# Patient Record
Sex: Female | Born: 1945 | Race: White | Hispanic: No | Smoking: Never smoker
Health system: Southern US, Community
[De-identification: ages and names within clinical notes are randomized; demographics above are authoritative.]

## PROBLEM LIST (undated history)

## (undated) DIAGNOSIS — G709 Myoneural disorder, unspecified: Secondary | ICD-10-CM

## (undated) DIAGNOSIS — Z860100 Personal history of colon polyps, unspecified: Secondary | ICD-10-CM

## (undated) DIAGNOSIS — E785 Hyperlipidemia, unspecified: Secondary | ICD-10-CM

## (undated) DIAGNOSIS — R010 Benign and innocent cardiac murmurs: Secondary | ICD-10-CM

## (undated) DIAGNOSIS — I10 Essential (primary) hypertension: Secondary | ICD-10-CM

## (undated) DIAGNOSIS — F32A Depression, unspecified: Secondary | ICD-10-CM

## (undated) DIAGNOSIS — K219 Gastro-esophageal reflux disease without esophagitis: Secondary | ICD-10-CM

## (undated) DIAGNOSIS — C4491 Basal cell carcinoma of skin, unspecified: Secondary | ICD-10-CM

## (undated) DIAGNOSIS — H269 Unspecified cataract: Secondary | ICD-10-CM

## (undated) DIAGNOSIS — T7840XA Allergy, unspecified, initial encounter: Secondary | ICD-10-CM

## (undated) DIAGNOSIS — N189 Chronic kidney disease, unspecified: Secondary | ICD-10-CM

## (undated) DIAGNOSIS — L570 Actinic keratosis: Secondary | ICD-10-CM

## (undated) DIAGNOSIS — F419 Anxiety disorder, unspecified: Secondary | ICD-10-CM

## (undated) DIAGNOSIS — F329 Major depressive disorder, single episode, unspecified: Secondary | ICD-10-CM

## (undated) HISTORY — DX: Anxiety disorder, unspecified: F41.9

## (undated) HISTORY — DX: Benign and innocent cardiac murmurs: R01.0

## (undated) HISTORY — DX: Basal cell carcinoma of skin, unspecified: C44.91

## (undated) HISTORY — DX: Personal history of colon polyps, unspecified: Z86.0100

## (undated) HISTORY — DX: Allergy, unspecified, initial encounter: T78.40XA

## (undated) HISTORY — DX: Actinic keratosis: L57.0

## (undated) HISTORY — DX: Essential (primary) hypertension: I10

## (undated) HISTORY — DX: Chronic kidney disease, unspecified: N18.9

## (undated) HISTORY — DX: Hyperlipidemia, unspecified: E78.5

## (undated) HISTORY — DX: Depression, unspecified: F32.A

## (undated) HISTORY — PX: ABDOMINAL HYSTERECTOMY: SHX81

## (undated) HISTORY — DX: Gastro-esophageal reflux disease without esophagitis: K21.9

## (undated) HISTORY — DX: Unspecified cataract: H26.9

## (undated) HISTORY — DX: Personal history of colon polyps: Z86.010

## (undated) HISTORY — DX: Myoneural disorder, unspecified: G70.9

## (undated) HISTORY — DX: Major depressive disorder, single episode, unspecified: F32.9

---

## 1989-03-13 HISTORY — PX: TOTAL ABDOMINAL HYSTERECTOMY W/ BILATERAL SALPINGOOPHORECTOMY: SHX83

## 2004-04-14 ENCOUNTER — Ambulatory Visit: Payer: Self-pay | Admitting: Family Medicine

## 2004-05-30 ENCOUNTER — Ambulatory Visit: Payer: Self-pay | Admitting: General Surgery

## 2004-12-06 ENCOUNTER — Ambulatory Visit: Payer: Self-pay | Admitting: Family Medicine

## 2005-06-29 ENCOUNTER — Ambulatory Visit: Payer: Self-pay | Admitting: Internal Medicine

## 2006-09-12 ENCOUNTER — Ambulatory Visit: Payer: Self-pay | Admitting: Specialist

## 2007-09-17 ENCOUNTER — Ambulatory Visit: Payer: Self-pay | Admitting: Internal Medicine

## 2007-09-26 ENCOUNTER — Ambulatory Visit: Payer: Self-pay | Admitting: Internal Medicine

## 2007-12-17 ENCOUNTER — Ambulatory Visit: Payer: Self-pay | Admitting: Internal Medicine

## 2008-06-01 ENCOUNTER — Ambulatory Visit: Payer: Self-pay | Admitting: Internal Medicine

## 2008-10-20 ENCOUNTER — Ambulatory Visit: Payer: Self-pay | Admitting: Internal Medicine

## 2008-11-03 ENCOUNTER — Ambulatory Visit: Payer: Self-pay | Admitting: Internal Medicine

## 2009-08-08 LAB — HM PAP SMEAR

## 2010-10-09 LAB — HM MAMMOGRAPHY: HM Mammogram: NORMAL

## 2010-12-23 ENCOUNTER — Telehealth: Payer: Self-pay | Admitting: Internal Medicine

## 2010-12-23 DIAGNOSIS — F32A Depression, unspecified: Secondary | ICD-10-CM

## 2010-12-23 DIAGNOSIS — F329 Major depressive disorder, single episode, unspecified: Secondary | ICD-10-CM

## 2010-12-23 MED ORDER — FLUOXETINE HCL 40 MG PO CAPS: 40.0000 mg | ORAL_CAPSULE | Freq: Every day | ORAL | Status: DC

## 2010-12-23 NOTE — Telephone Encounter (Signed)
Patient is currently on fluoxetine 20 mg capsules she feels like she needs something stronger and is asking if could increase it to 40 mg.

## 2010-12-23 NOTE — Telephone Encounter (Signed)
Pt husband walked in wanted to see if you can refill her rx for fluoxetine 20mg  capsules .  Pt wanted to know if you could up 20mg  to 40mg  Pharmacy [facility]

## 2010-12-23 NOTE — Telephone Encounter (Signed)
Yes she can increase her dose of fluoxetine to 40 mg daiy.  i will send a new rx to her pharmacy

## 2010-12-23 NOTE — Telephone Encounter (Signed)
Patient notified. Rx sent to pharmacy,.

## 2011-03-27 ENCOUNTER — Telehealth: Payer: Self-pay | Admitting: Internal Medicine

## 2011-03-27 DIAGNOSIS — F329 Major depressive disorder, single episode, unspecified: Secondary | ICD-10-CM

## 2011-03-27 DIAGNOSIS — F32A Depression, unspecified: Secondary | ICD-10-CM

## 2011-03-27 MED ORDER — VALSARTAN-HYDROCHLOROTHIAZIDE 80-12.5 MG PO TABS: 1.0000 | ORAL_TABLET | Freq: Every day | ORAL | Status: DC

## 2011-03-27 MED ORDER — FLUOXETINE HCL 20 MG PO TABS: 20.0000 mg | ORAL_TABLET | Freq: Every day | ORAL | Status: DC

## 2011-03-27 NOTE — Telephone Encounter (Signed)
I cannot fill the second med with out a dose.  Her chart has not been abstracted.  Can you find out from patient  thanks

## 2011-03-27 NOTE — Telephone Encounter (Signed)
Valsartan hctz 160mg /25mg   1/2 tablet daily  Pt cut in half.  If generic pt stated she would be ok with that

## 2011-03-27 NOTE — Telephone Encounter (Signed)
Both meds are already generic.  Doses were adjusted to whole tablets. Can pick up in am

## 2011-03-27 NOTE — Telephone Encounter (Signed)
[phone] Pt called insurance changed and she needs to get 90 supply of meds plus 3 refills flueoxtine 40mg  1 daily     Pt was taking 20mg  2x daily  She would like to switch back to the 20mg  she stated she didn't fill like she needed the 40 every day  Valsartan hctz 1/2 tablet daily  Pt would like to pick up rx

## 2011-03-28 NOTE — Telephone Encounter (Signed)
Called pt to let her know rx is ready to be picked up

## 2011-03-29 ENCOUNTER — Other Ambulatory Visit: Payer: Self-pay | Admitting: Internal Medicine

## 2011-03-29 MED ORDER — FLUOXETINE HCL 20 MG PO TABS: 20.0000 mg | ORAL_TABLET | Freq: Two times a day (BID) | ORAL | Status: DC

## 2011-07-13 ENCOUNTER — Telehealth: Payer: Self-pay | Admitting: Internal Medicine

## 2011-07-13 DIAGNOSIS — Z1239 Encounter for other screening for malignant neoplasm of breast: Secondary | ICD-10-CM

## 2011-07-13 NOTE — Telephone Encounter (Signed)
Patient needing a follow up mammogram order and labs .

## 2011-07-17 NOTE — Telephone Encounter (Signed)
On printer

## 2011-07-17 NOTE — Telephone Encounter (Signed)
Patient notified

## 2011-08-09 ENCOUNTER — Ambulatory Visit (INDEPENDENT_AMBULATORY_CARE_PROVIDER_SITE_OTHER): Payer: Managed Care, Other (non HMO) | Admitting: Internal Medicine

## 2011-08-09 ENCOUNTER — Encounter: Payer: Self-pay | Admitting: Internal Medicine

## 2011-08-09 VITALS — BP 130/76 | HR 75 | Temp 98.1°F | Resp 16 | Wt 165.0 lb

## 2011-08-09 DIAGNOSIS — J301 Allergic rhinitis due to pollen: Secondary | ICD-10-CM

## 2011-08-09 DIAGNOSIS — F329 Major depressive disorder, single episode, unspecified: Secondary | ICD-10-CM

## 2011-08-09 DIAGNOSIS — Z86010 Personal history of colonic polyps: Secondary | ICD-10-CM

## 2011-08-09 DIAGNOSIS — K219 Gastro-esophageal reflux disease without esophagitis: Secondary | ICD-10-CM

## 2011-08-09 DIAGNOSIS — R5383 Other fatigue: Secondary | ICD-10-CM

## 2011-08-09 DIAGNOSIS — I10 Essential (primary) hypertension: Secondary | ICD-10-CM

## 2011-08-09 DIAGNOSIS — F32A Depression, unspecified: Secondary | ICD-10-CM

## 2011-08-09 DIAGNOSIS — E785 Hyperlipidemia, unspecified: Secondary | ICD-10-CM

## 2011-08-09 DIAGNOSIS — R5381 Other malaise: Secondary | ICD-10-CM

## 2011-08-09 NOTE — Patient Instructions (Addendum)
You can try 180 mg of allegra daily (fexofenadine)    Return for fasting labs  Consider using simply saline twice da day to lavage your sinuses

## 2011-08-09 NOTE — Progress Notes (Signed)
Patient ID: Grace [name], female   DOB: 1945-04-05, 66 y.o.   MRN: [id number] Patient Active Problem List  Diagnoses  . Depression  . GERD (gastroesophageal reflux disease)  . Hypertension  . Benign heart murmur  . Hx of colonic polyps  . Allergic rhinitis due to pollen  . Other and unspecified hyperlipidemia    Subjective:  CC:   Chief Complaint  Patient presents with  . Follow-up    HPI:   Grace [name] a 66 y.o. female who presents  Past Medical History  Diagnosis Date  . Hyperlipidemia   . Depression     resolved  . allergic rhinitis   . GERD (gastroesophageal reflux disease)   . Hypertension   . Benign heart murmur   . Hx of colonic polyps     History reviewed. No pertinent past surgical history.       The following portions of the patient's history were reviewed and updated as appropriate: Allergies, current medications, and problem list.    Review of Systems:   12 Pt  review of systems was negative except those addressed in the HPI,     History   Social History  . Marital Status: Married    Spouse Name: N/A    Number of Children: N/A  . Years of Education: N/A   Occupational History  . Not on file.   Social History Main Topics  . Smoking status: Never Smoker   . Smokeless tobacco: Never Used  . Alcohol Use: No  . Drug Use: No  . Sexually Active: Not on file   Other Topics Concern  . Not on file   Social History Narrative  . No narrative on file    Objective:  BP 130/76  Pulse 75  Temp(Src) 98.1 F (36.7 C) (Oral)  Resp 16  Wt 165 lb (74.844 kg)  SpO2 97%  General appearance: alert, cooperative and appears stated age Ears: normal TM's and external ear canals both ears Throat: lips, mucosa, and tongue normal; teeth and gums normal Neck: no adenopathy, no carotid bruit, supple, symmetrical, trachea midline and thyroid not enlarged, symmetric, no tenderness/mass/nodules Back: symmetric, no curvature. ROM normal. No CVA  tenderness. Lungs: clear to auscultation bilaterally Heart: regular rate and rhythm, S1, S2 normal, no murmur, click, rub or gallop Abdomen: soft, non-tender; bowel sounds normal; no masses,  no organomegaly Pulses: 2+ and symmetric Skin: Skin color, texture, turgor normal. No rashes or lesions Lymph nodes: Cervical, supraclavicular, and axillary nodes normal.  Assessment and Plan:  GERD (gastroesophageal reflux disease) Managed with daily omeprazole. No sympomts of dysphagia or breakthrough symptoms   Depression Currently resolved.   Hx of colonic polyps Found n last colonoscopy,  5 yr follow up planned. Dr. Charna Elizabeth.   Hypertension Well controlled on current medications.  No changes today.  Allergic rhinitis due to pollen Not controlled with childrens strength zyrtec.  Alternatives including 10 ng dose vs allegra, plus saline lavage  Recommended.   Other and unspecified hyperlipidemia Untreated LDL is 150,  HDL is 60 , normal triglycerides,  Will recommended repeat in 6 mnonths after diet and exercise for gaol LDL of 100,  Statin therapy trial will be discussed pending repeat evaluation .     Updated Medication List Outpatient Encounter Prescriptions as of 08/09/2011  Medication Sig Dispense Refill  . cetirizine (ZYRTEC) 5 MG tablet Take 5 mg by mouth daily.      Marland Kitchen esomeprazole (NEXIUM) 40 MG capsule Take 40 mg by mouth  daily before breakfast.      . Krill Oil 300 MG CAPS Take by mouth.      . Multiple Vitamin (MULTIVITAMIN) tablet Take 1 tablet by mouth daily.      Marland Kitchen omeprazole (PRILOSEC OTC) 20 MG tablet Take 20 mg by mouth daily.      . Probiotic Product (PROBIOTIC FORMULA PO) Take by mouth.      . QUERCETIN PO Take by mouth.      . valsartan-hydrochlorothiazide (DIOVAN HCT) 80-12.5 MG per tablet Take 1 tablet by mouth daily.  90 tablet  3  . DISCONTD: FLUoxetine (PROZAC) 20 MG tablet Take 1 tablet (20 mg total) by mouth 2 (two) times daily.  180 tablet  3      Orders Placed This Encounter  Procedures  . HM MAMMOGRAPHY  . HM PAP SMEAR  . TSH  . Lipid panel  . COMPLETE METABOLIC PANEL WITH GFR  . HM COLONOSCOPY    No Follow-up on file.

## 2011-08-11 ENCOUNTER — Other Ambulatory Visit (INDEPENDENT_AMBULATORY_CARE_PROVIDER_SITE_OTHER): Payer: Managed Care, Other (non HMO) | Admitting: *Deleted

## 2011-08-11 DIAGNOSIS — E785 Hyperlipidemia, unspecified: Secondary | ICD-10-CM

## 2011-08-11 DIAGNOSIS — R5383 Other fatigue: Secondary | ICD-10-CM

## 2011-08-11 DIAGNOSIS — R5381 Other malaise: Secondary | ICD-10-CM

## 2011-08-11 LAB — LIPID PANEL
Cholesterol: 229 mg/dL — ABNORMAL HIGH (ref 0–200)
Total CHOL/HDL Ratio: 4

## 2011-08-12 LAB — COMPLETE METABOLIC PANEL WITH GFR
ALT: 19 U/L (ref 0–35)
AST: 21 U/L (ref 0–37)
Calcium: 9 mg/dL (ref 8.4–10.5)
Chloride: 105 mEq/L (ref 96–112)
Creat: 0.97 mg/dL (ref 0.50–1.10)
Sodium: 142 mEq/L (ref 135–145)
Total Bilirubin: 0.4 mg/dL (ref 0.3–1.2)
Total Protein: 6.1 g/dL (ref 6.0–8.3)

## 2011-08-13 ENCOUNTER — Encounter: Payer: Self-pay | Admitting: Internal Medicine

## 2011-08-13 DIAGNOSIS — F339 Major depressive disorder, recurrent, unspecified: Secondary | ICD-10-CM | POA: Insufficient documentation

## 2011-08-13 DIAGNOSIS — Z860100 Personal history of colon polyps, unspecified: Secondary | ICD-10-CM | POA: Insufficient documentation

## 2011-08-13 DIAGNOSIS — K219 Gastro-esophageal reflux disease without esophagitis: Secondary | ICD-10-CM | POA: Insufficient documentation

## 2011-08-13 DIAGNOSIS — J301 Allergic rhinitis due to pollen: Secondary | ICD-10-CM | POA: Insufficient documentation

## 2011-08-13 DIAGNOSIS — E785 Hyperlipidemia, unspecified: Secondary | ICD-10-CM | POA: Insufficient documentation

## 2011-08-13 DIAGNOSIS — I10 Essential (primary) hypertension: Secondary | ICD-10-CM | POA: Insufficient documentation

## 2011-08-13 DIAGNOSIS — R010 Benign and innocent cardiac murmurs: Secondary | ICD-10-CM | POA: Insufficient documentation

## 2011-08-13 NOTE — Assessment & Plan Note (Signed)
Found n last colonoscopy,  5 yr follow up planned. Dr. Charna Elizabeth.

## 2011-08-13 NOTE — Assessment & Plan Note (Signed)
Not controlled with childrens strength zyrtec.  Alternatives including 10 ng dose vs allegra, plus saline lavage  Recommended.

## 2011-08-13 NOTE — Assessment & Plan Note (Signed)
Currently resolved. °

## 2011-08-13 NOTE — Assessment & Plan Note (Signed)
Managed with daily omeprazole. No sympomts of dysphagia or breakthrough symptoms

## 2011-08-13 NOTE — Assessment & Plan Note (Signed)
Untreated LDL is 150,  HDL is 60 , normal triglycerides,  Will recommended repeat in 6 mnonths after diet and exercise for gaol LDL of 100,  Statin therapy trial will be discussed pending repeat evaluation .

## 2011-08-13 NOTE — Assessment & Plan Note (Signed)
Well controlled on current medications.  No changes today. 

## 2011-09-14 LAB — HM MAMMOGRAPHY: HM Mammogram: NORMAL

## 2011-10-17 ENCOUNTER — Encounter: Payer: Self-pay | Admitting: Internal Medicine

## 2011-10-30 ENCOUNTER — Encounter: Payer: Self-pay | Admitting: Internal Medicine

## 2011-12-19 ENCOUNTER — Encounter: Payer: Self-pay | Admitting: Internal Medicine

## 2012-02-14 ENCOUNTER — Ambulatory Visit (INDEPENDENT_AMBULATORY_CARE_PROVIDER_SITE_OTHER): Payer: Managed Care, Other (non HMO) | Admitting: Internal Medicine

## 2012-02-14 ENCOUNTER — Encounter: Payer: Self-pay | Admitting: Internal Medicine

## 2012-02-14 ENCOUNTER — Telehealth: Payer: Self-pay | Admitting: Internal Medicine

## 2012-02-14 VITALS — BP 132/74 | HR 81 | Temp 98.2°F | Resp 12 | Ht 66.0 in | Wt 167.8 lb

## 2012-02-14 DIAGNOSIS — I10 Essential (primary) hypertension: Secondary | ICD-10-CM

## 2012-02-14 DIAGNOSIS — E785 Hyperlipidemia, unspecified: Secondary | ICD-10-CM

## 2012-02-14 DIAGNOSIS — M2010 Hallux valgus (acquired), unspecified foot: Secondary | ICD-10-CM

## 2012-02-14 DIAGNOSIS — Z23 Encounter for immunization: Secondary | ICD-10-CM

## 2012-02-14 DIAGNOSIS — T819XXA Unspecified complication of procedure, initial encounter: Secondary | ICD-10-CM

## 2012-02-14 DIAGNOSIS — M21619 Bunion of unspecified foot: Secondary | ICD-10-CM

## 2012-02-14 DIAGNOSIS — K219 Gastro-esophageal reflux disease without esophagitis: Secondary | ICD-10-CM

## 2012-02-14 DIAGNOSIS — M21611 Bunion of right foot: Secondary | ICD-10-CM

## 2012-02-14 NOTE — Telephone Encounter (Signed)
Pt came by and said she found Dr. Virl Son at [facility] in [place] there ph number is [phone]. She also say if you know of someone in [place] that is better that she would love to go to someone that you recommend as well.

## 2012-02-14 NOTE — Progress Notes (Addendum)
Patient ID: Grace [name], female   DOB: 1945-06-26, 66 y.o.   MRN: [id number]    Patient Active Problem List  Diagnosis  . Depression  . GERD (gastroesophageal reflux disease)  . Hypertension  . Benign heart murmur  . Hx of colonic polyps  . Allergic rhinitis due to pollen  . Other and unspecified hyperlipidemia  . Hallux valgus with bunions    Subjective:  CC:   Chief Complaint  Patient presents with  . Follow-up    HPI:   Grace [name] a 66 y.o. female who presents 6 month follow up on hypertension and hyperlipidemia.  She is tolerating her medications without sided effects.   She checks her blood pressure randomly at home and her readings have been < 140/80 consistently.  2) She hasa new complaint of worsening persistent right foot pain and is unable to wear any shoes other than tennis shoes due to a great toe bunion that failed to resolve with prior bunionectomy.  Her second and third toes have deviated medially and cross her great toe  She has considered repeat surgery but is requesting my opinion.      Past Medical History  Diagnosis Date  . Hyperlipidemia   . Depression     resolved  . allergic rhinitis   . GERD (gastroesophageal reflux disease)   . Hypertension   . Benign heart murmur   . Hx of colonic polyps     History reviewed. No pertinent past surgical history.       The following portions of the patient's history were reviewed and updated as appropriate: Allergies, current medications, and problem list.    Review of Systems:   12 Pt  review of systems was negative except those addressed in the HPI,     History   Social History  . Marital Status: Married    Spouse Name: N/A    Number of Children: N/A  . Years of Education: N/A   Occupational History  . Not on file.   Social History Main Topics  . Smoking status: Never Smoker   . Smokeless tobacco: Never Used  . Alcohol Use: No  . Drug Use: No  . Sexually Active: Not on file    Other Topics Concern  . Not on file   Social History Narrative  . No narrative on file    Objective:  BP 132/74  Pulse 81  Temp 98.2 F (36.8 C) (Oral)  Resp 12  Ht 5\' 6"  (1.676 m)  Wt 167 lb 12 oz (76.091 kg)  BMI 27.08 kg/m2  SpO2 96%  General appearance: alert, cooperative and appears stated age Ears: normal TM's and external ear canals both ears Throat: lips, mucosa, and tongue normal; teeth and gums normal Neck: no adenopathy, no carotid bruit, supple, symmetrical, trachea midline and thyroid not enlarged, symmetric, no tenderness/mass/nodules Back: symmetric, no curvature. ROM normal. No CVA tenderness. Lungs: clear to auscultation bilaterally Heart: regular rate and rhythm, S1, S2 normal, no murmur, click, rub or gallop Abdomen: soft, non-tender; bowel sounds normal; no masses,  no organomegaly Pulses: 2+ and symmetric Skin: Skin color, texture, turgor normal. No rashes or lesions Lymph nodes: Cervical, supraclavicular, and axillary nodes normal. Foot: right great toe bunion with lateral deviation, medial deviation of 2nd and 3rd metatarsals  Assessment and Plan:  Hypertension Well controlled on current regimen. Renal function stable, no changes today.  GERD (gastroesophageal reflux disease) Well controlled on current regimen, no changes today.  Other and unspecified hyperlipidemia Thus  far managed with diet and krill oil, but her last LDL was elevated at 150. She is due for repeat testing.  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  I have recommended that she pursue surgical correction and have referred her to the podiatrist of her choice.    Updated Medication List Outpatient Encounter Prescriptions as of 02/14/2012  Medication Sig Dispense Refill  . cetirizine (ZYRTEC) 5 MG tablet Take 5 mg by mouth daily.      Marland Kitchen esomeprazole (NEXIUM) 40  MG capsule Take 40 mg by mouth daily before breakfast.      . Krill Oil 300 MG CAPS Take by mouth.      . Multiple Vitamin (MULTIVITAMIN) tablet Take 1 tablet by mouth daily.      Marland Kitchen omeprazole (PRILOSEC OTC) 20 MG tablet Take 20 mg by mouth daily.      . Probiotic Product (PROBIOTIC FORMULA PO) Take by mouth.      . QUERCETIN PO Take by mouth.      . valsartan-hydrochlorothiazide (DIOVAN HCT) 80-12.5 MG per tablet Take 1 tablet by mouth daily.  90 tablet  3     Orders Placed This Encounter  Procedures  . HM MAMMOGRAPHY  . Tdap vaccine greater than or equal to 7yo IM    No Follow-up on file.

## 2012-02-14 NOTE — Patient Instructions (Addendum)
Try red yeast rice capssules 600 mg twice daily     Return for your fasting labs in 6 weeks

## 2012-02-14 NOTE — Telephone Encounter (Signed)
Pt called back wanting to add info Pt  Wanting to see if you could get her in this month she has several days off

## 2012-02-15 NOTE — Telephone Encounter (Signed)
Now you are. wasa waiting on the name of the surgeon from patient.

## 2012-02-15 NOTE — Telephone Encounter (Signed)
Dr. Darrick Huntsman am I suppose to be setting up referral for patient? I don't see a referral in her chart.

## 2012-02-16 NOTE — Telephone Encounter (Signed)
Per Referral patient is scheduled for 03-25-12 with Dr. Braxton Feathers and this information has been left on her voice mail.

## 2012-02-17 ENCOUNTER — Encounter: Payer: Self-pay | Admitting: Internal Medicine

## 2012-02-17 DIAGNOSIS — M2010 Hallux valgus (acquired), unspecified foot: Secondary | ICD-10-CM | POA: Insufficient documentation

## 2012-02-17 DIAGNOSIS — M21619 Bunion of unspecified foot: Secondary | ICD-10-CM | POA: Insufficient documentation

## 2012-02-17 NOTE — Assessment & Plan Note (Signed)
Well controlled on current regimen, no changes today. 

## 2012-02-17 NOTE — Assessment & Plan Note (Signed)
Well controlled on current regimen. Renal function stable, no changes today. 

## 2012-02-17 NOTE — Assessment & Plan Note (Addendum)
Thus far managed with diet and krill oil, but her last LDL was elevated at 150. She is due for repeat testing.  Goal is LDL 100

## 2012-02-17 NOTE — Assessment & Plan Note (Signed)
She has a significant deformity which is already debilitating and likely to cuase increasing disability with age.  I have recommended that she pursue surgical correction and have referred her to the podiatrist of her choice.

## 2012-03-01 ENCOUNTER — Other Ambulatory Visit: Payer: Self-pay | Admitting: Internal Medicine

## 2012-03-01 ENCOUNTER — Other Ambulatory Visit: Payer: Self-pay

## 2012-03-01 DIAGNOSIS — F329 Major depressive disorder, single episode, unspecified: Secondary | ICD-10-CM

## 2012-03-01 DIAGNOSIS — F32A Depression, unspecified: Secondary | ICD-10-CM

## 2012-03-01 MED ORDER — FLUOXETINE HCL 20 MG PO TABS: 20.0000 mg | ORAL_TABLET | Freq: Every day | ORAL | Status: DC

## 2012-03-01 MED ORDER — VALSARTAN-HYDROCHLOROTHIAZIDE 80-12.5 MG PO TABS: 1.0000 | ORAL_TABLET | Freq: Every day | ORAL | Status: DC

## 2012-03-01 NOTE — Telephone Encounter (Signed)
Valsartan/HCTZ  80/12.5  Take 1 tablet by mouth daily  #90

## 2012-03-01 NOTE — Telephone Encounter (Signed)
Valsartan hydrochlorothiazide 80-12.5 mg # 90 3 R sent to [facility]

## 2012-03-01 NOTE — Addendum Note (Signed)
Addended by: Sherlene Shams on: 03/01/2012 02:42 PM   Modules accepted: Orders

## 2012-03-01 NOTE — Telephone Encounter (Signed)
prozac rx sent to [facility]

## 2012-03-01 NOTE — Telephone Encounter (Signed)
Pt came by to drop off RX refill and she was wondering about the generic of prozac. She says she discussed it with Dr. Darrick Huntsman at her last visit and was wondering if that could be sent into [facility] on [address] for 1 year supply.

## 2012-03-07 ENCOUNTER — Other Ambulatory Visit: Payer: Self-pay | Admitting: Internal Medicine

## 2012-03-07 ENCOUNTER — Encounter: Payer: Self-pay | Admitting: Internal Medicine

## 2012-03-07 DIAGNOSIS — F329 Major depressive disorder, single episode, unspecified: Secondary | ICD-10-CM

## 2012-03-07 DIAGNOSIS — F32A Depression, unspecified: Secondary | ICD-10-CM

## 2012-03-07 MED ORDER — VALSARTAN-HYDROCHLOROTHIAZIDE 80-12.5 MG PO TABS: 1.0000 | ORAL_TABLET | Freq: Every day | ORAL | Status: DC

## 2012-03-07 MED ORDER — FLUOXETINE HCL 40 MG PO CAPS: 40.0000 mg | ORAL_CAPSULE | Freq: Every day | ORAL | Status: DC

## 2012-03-07 MED ORDER — ESOMEPRAZOLE MAGNESIUM 40 MG PO CPDR: 40.0000 mg | DELAYED_RELEASE_CAPSULE | Freq: Every day | ORAL | Status: DC

## 2012-03-07 MED ORDER — FLUOXETINE HCL 20 MG PO TABS: 20.0000 mg | ORAL_TABLET | Freq: Every day | ORAL | Status: DC

## 2012-03-07 MED ORDER — OMEPRAZOLE MAGNESIUM 20 MG PO TBEC: 20.0000 mg | DELAYED_RELEASE_TABLET | Freq: Every day | ORAL | Status: DC

## 2012-03-07 NOTE — Telephone Encounter (Signed)
Med filled on 12/26.

## 2012-03-07 NOTE — Telephone Encounter (Signed)
meds filled

## 2012-03-07 NOTE — Telephone Encounter (Signed)
I have sent a new rx for 40 mg fluoxetine #90 to [facility] and the rx for valsartan/hct to [facility]  #90, 3 refills each

## 2012-03-07 NOTE — Telephone Encounter (Signed)
Pt is needing her B/P meds sent in to [facility] and the rest sent to [facility]. Her B/P meds were sent to [facility] instead of [facility].

## 2012-03-08 NOTE — Telephone Encounter (Signed)
Pt.notified

## 2012-03-11 NOTE — Telephone Encounter (Signed)
Should pt be taking 2 20 mg capsules or 1 20mg  capsule? Please advise I refilled off of old Rx and pt states this is wrong.

## 2012-03-11 NOTE — Telephone Encounter (Signed)
Pharmacy is requesting capsules due to price and the patient says it is supposed to be 2-20 mg capsules please resend for # 180 2 QD with 3 refills.

## 2012-03-12 ENCOUNTER — Encounter: Payer: Self-pay | Admitting: Internal Medicine

## 2012-03-14 ENCOUNTER — Other Ambulatory Visit: Payer: Self-pay | Admitting: *Deleted

## 2012-03-14 ENCOUNTER — Encounter: Payer: Self-pay | Admitting: Internal Medicine

## 2012-03-14 MED ORDER — FLUOXETINE HCL 40 MG PO CAPS: 40.0000 mg | ORAL_CAPSULE | Freq: Every day | ORAL | Status: DC

## 2012-03-14 NOTE — Telephone Encounter (Signed)
Patient notified of Rx for Fluoxetine 40mg  capsule is ready be called in to her pharmacy. Patient stated would rather come pick up Rx after work. Rx at front desk.

## 2012-03-20 DIAGNOSIS — Z0279 Encounter for issue of other medical certificate: Secondary | ICD-10-CM

## 2012-03-22 ENCOUNTER — Telehealth: Payer: Self-pay | Admitting: Internal Medicine

## 2012-03-22 NOTE — Telephone Encounter (Signed)
Pt aware her pre-op paperwork is ready to be picked up

## 2012-03-27 ENCOUNTER — Other Ambulatory Visit (INDEPENDENT_AMBULATORY_CARE_PROVIDER_SITE_OTHER): Payer: Managed Care, Other (non HMO)

## 2012-03-27 DIAGNOSIS — E785 Hyperlipidemia, unspecified: Secondary | ICD-10-CM

## 2012-03-27 DIAGNOSIS — R5383 Other fatigue: Secondary | ICD-10-CM

## 2012-03-27 LAB — LDL CHOLESTEROL, DIRECT: Direct LDL: 139.1 mg/dL

## 2012-03-27 LAB — COMPLETE METABOLIC PANEL WITH GFR
AST: 24 U/L (ref 0–37)
Albumin: 4.4 g/dL (ref 3.5–5.2)
BUN: 15 mg/dL (ref 6–23)
Calcium: 9 mg/dL (ref 8.4–10.5)
Chloride: 105 mEq/L (ref 96–112)
Glucose, Bld: 92 mg/dL (ref 70–99)
Potassium: 4.1 mEq/L (ref 3.5–5.3)

## 2012-03-27 LAB — LIPID PANEL: VLDL: 25.2 mg/dL (ref 0.0–40.0)

## 2012-04-27 ENCOUNTER — Other Ambulatory Visit: Payer: Self-pay

## 2012-08-27 ENCOUNTER — Ambulatory Visit (INDEPENDENT_AMBULATORY_CARE_PROVIDER_SITE_OTHER): Payer: Managed Care, Other (non HMO) | Admitting: Adult Health

## 2012-08-27 ENCOUNTER — Encounter: Payer: Self-pay | Admitting: Adult Health

## 2012-08-27 VITALS — BP 120/76 | HR 86 | Resp 12 | Wt 167.5 lb

## 2012-08-27 DIAGNOSIS — R928 Other abnormal and inconclusive findings on diagnostic imaging of breast: Secondary | ICD-10-CM

## 2012-08-27 DIAGNOSIS — N63 Unspecified lump in unspecified breast: Secondary | ICD-10-CM | POA: Insufficient documentation

## 2012-08-27 DIAGNOSIS — Z1239 Encounter for other screening for malignant neoplasm of breast: Secondary | ICD-10-CM

## 2012-08-27 NOTE — Progress Notes (Signed)
  Subjective:    Patient ID: Grace [name], female    DOB: 05-Apr-1945, 67 y.o.   MRN: [id number]  HPI  Patient is a pleasant 67 year old female who presents s/p fall ~ 3 weeks ago hitting the left side her breast and ribs. She applied ice. Next day she began to develop a bruise which also involved the left breast. Now she feels a tender knot on the lateral side of the left breast. She has a mammogram appt in July 29th however patient did not want to wait until that time for further evaluation. Hx of abnormal mammograms with benign findings. She denies any other symptoms at this time.    Current Outpatient Prescriptions on File Prior to Visit  Medication Sig Dispense Refill  . cetirizine (ZYRTEC) 5 MG tablet Take 5 mg by mouth daily.      Marland Kitchen FLUoxetine (PROZAC) 40 MG capsule Take 1 capsule (40 mg total) by mouth daily.  90 capsule  3  . Krill Oil 300 MG CAPS Take by mouth.      . Multiple Vitamin (MULTIVITAMIN) tablet Take 1 tablet by mouth daily.      . Probiotic Product (PROBIOTIC FORMULA PO) Take by mouth.      . QUERCETIN PO Take by mouth.      . valsartan-hydrochlorothiazide (DIOVAN HCT) 80-12.5 MG per tablet Take 1 tablet by mouth daily.  90 tablet  3   No current facility-administered medications on file prior to visit.     Review of Systems Breast: Left lateral breast tenderness with palpable knot. Bruising of left breast. S/p fall 3 weeks ago.   BP 120/76  Pulse 86  Resp 12  Wt 167 lb 8 oz (75.978 kg)  BMI 27.05 kg/m2  SpO2 97%    Objective:   Physical Exam  Genitourinary: There is breast tenderness. No breast discharge or bleeding.  Ecchymosis of left lateral breast at 3 o'clock. 2 palpable hard nodules at this location.      Assessment & Plan:

## 2012-08-27 NOTE — Assessment & Plan Note (Signed)
2 Palpable hard, tender nodules on left lateral breast. Uncertain if this is related to recent injury to the area. Send for diagnostic mammogram and ultrasound. The nodules are located at 3 o'clock and far laterally. I am not certain that the mammogram would capture that area well which is why I am ordering the ultrasound. Patient scheduled for today.

## 2012-08-27 NOTE — Patient Instructions (Addendum)
  I am sending you for a diagnostic mammogram of both breast and ultrasound of left breast.  Please see Grace [name] prior to leaving the office.

## 2012-09-26 ENCOUNTER — Encounter: Payer: Self-pay | Admitting: Internal Medicine

## 2012-10-15 ENCOUNTER — Encounter: Payer: Managed Care, Other (non HMO) | Admitting: Internal Medicine

## 2012-10-16 ENCOUNTER — Other Ambulatory Visit: Payer: Self-pay

## 2012-12-17 ENCOUNTER — Encounter: Payer: Self-pay | Admitting: Internal Medicine

## 2012-12-17 ENCOUNTER — Ambulatory Visit (INDEPENDENT_AMBULATORY_CARE_PROVIDER_SITE_OTHER): Payer: Managed Care, Other (non HMO) | Admitting: Internal Medicine

## 2012-12-17 VITALS — BP 140/82 | HR 74 | Temp 98.2°F | Resp 14 | Ht 66.0 in | Wt 163.0 lb

## 2012-12-17 DIAGNOSIS — E785 Hyperlipidemia, unspecified: Secondary | ICD-10-CM

## 2012-12-17 DIAGNOSIS — R5381 Other malaise: Secondary | ICD-10-CM

## 2012-12-17 DIAGNOSIS — M2011 Hallux valgus (acquired), right foot: Secondary | ICD-10-CM

## 2012-12-17 DIAGNOSIS — N63 Unspecified lump in unspecified breast: Secondary | ICD-10-CM

## 2012-12-17 DIAGNOSIS — L03011 Cellulitis of right finger: Secondary | ICD-10-CM

## 2012-12-17 DIAGNOSIS — R928 Other abnormal and inconclusive findings on diagnostic imaging of breast: Secondary | ICD-10-CM

## 2012-12-17 DIAGNOSIS — I10 Essential (primary) hypertension: Secondary | ICD-10-CM

## 2012-12-17 DIAGNOSIS — M21619 Bunion of unspecified foot: Secondary | ICD-10-CM

## 2012-12-17 DIAGNOSIS — IMO0002 Reserved for concepts with insufficient information to code with codable children: Secondary | ICD-10-CM

## 2012-12-17 DIAGNOSIS — E559 Vitamin D deficiency, unspecified: Secondary | ICD-10-CM

## 2012-12-17 DIAGNOSIS — M2010 Hallux valgus (acquired), unspecified foot: Secondary | ICD-10-CM

## 2012-12-17 DIAGNOSIS — Z0000 Encounter for general adult medical examination without abnormal findings: Secondary | ICD-10-CM

## 2012-12-17 LAB — COMPREHENSIVE METABOLIC PANEL
ALT: 27 U/L (ref 0–35)
BUN: 16 mg/dL (ref 6–23)
CO2: 30 mEq/L (ref 19–32)
Creatinine, Ser: 0.8 mg/dL (ref 0.4–1.2)
GFR: 74.21 mL/min (ref >60.00)
Total Bilirubin: 0.6 mg/dL (ref 0.3–1.2)

## 2012-12-17 LAB — CBC WITH DIFFERENTIAL/PLATELET
Basophils Absolute: 0 10*3/uL (ref 0.0–0.1)
Eosinophils Absolute: 0.1 10*3/uL (ref 0.0–0.7)
Eosinophils Relative: 2 % (ref 0.0–5.0)
HCT: 40.9 % (ref 36.0–46.0)
Hemoglobin: 13.8 g/dL (ref 12.0–15.0)
Lymphs Abs: 3.3 10*3/uL (ref 0.7–4.0)
MCHC: 33.8 g/dL (ref 30.0–36.0)
MCV: 93.7 fl (ref 78.0–100.0)
Monocytes Relative: 4.5 % (ref 3.0–12.0)
Neutro Abs: 3.6 10*3/uL (ref 1.4–7.7)
RDW: 13.8 % (ref 11.5–14.6)

## 2012-12-17 LAB — LDL CHOLESTEROL, DIRECT: Direct LDL: 159.2 mg/dL

## 2012-12-17 LAB — LIPID PANEL
HDL: 62.4 mg/dL (ref >39.00)
VLDL: 22.4 mg/dL (ref 0.0–40.0)

## 2012-12-17 MED ORDER — MELOXICAM 15 MG PO TABS: 15.0000 mg | ORAL_TABLET | Freq: Every day | ORAL | Status: DC

## 2012-12-17 NOTE — Progress Notes (Signed)
Patient ID: Grace [name], female   DOB: 1945/11/01, 67 y.o.   MRN: [id number]   Subjective:     Grace [name] is a 67 y.o. female and is here for a comprehensive physical exam. The patient reports continued swelling of right foot since surgery in February.  History   Social History  . Marital Status: Married    Spouse Name: N/A    Number of Children: N/A  . Years of Education: N/A   Occupational History  . Not on file.   Social History Main Topics  . Smoking status: Never Smoker   . Smokeless tobacco: Never Used  . Alcohol Use: No  . Drug Use: No  . Sexual Activity: Not on file   Other Topics Concern  . Not on file   Social History Narrative  . No narrative on file   Health Maintenance  Topic Date Due  . Zostavax  04/19/2007  . Pneumococcal Polysaccharide Vaccine Age 67 And Over  04/18/2012  . Influenza Vaccine  10/11/2012  . Mammogram  08/28/2014  . Colonoscopy  09/09/2019  . Tetanus/tdap  02/13/2022    The following portions of the patient's history were reviewed and updated as appropriate: allergies, current medications, past family history, past medical history, past social history, past surgical history and problem list.  Review of Systems A comprehensive review of systems was negative.   Objective:  BP 140/82  Pulse 74  Temp(Src) 98.2 F (36.8 C) (Oral)  Resp 14  Ht 5\' 6"  (1.676 m)  Wt 163 lb (73.936 kg)  BMI 26.32 kg/m2  SpO2 98%  General Appearance:    Alert, cooperative, no distress, appears stated age  Head:    Normocephalic, without obvious abnormality, atraumatic  Eyes:    PERRL, conjunctiva/corneas clear, EOM's intact, fundi    benign, both eyes  Ears:    Normal TM's and external ear canals, both ears  Nose:   Nares normal, septum midline, mucosa normal, no drainage    or sinus tenderness  Throat:   Lips, mucosa, and tongue normal; teeth and gums normal  Neck:   Supple, symmetrical, trachea midline, no adenopathy;    thyroid:  no  enlargement/tenderness/nodules; no carotid   bruit or JVD  Back:     Symmetric, no curvature, ROM normal, no CVA tenderness  Lungs:     Clear to auscultation bilaterally, respirations unlabored  Chest Wall:    No tenderness or deformity   Heart:    Regular rate and rhythm, S1 and S2 normal, no murmur, rub   or gallop  Breast Exam:    No tenderness, masses, or nipple abnormality  Abdomen:     Soft, non-tender, bowel sounds active all four quadrants,    no masses, no organomegaly        Extremities:   Extremities normal, atraumatic, no cyanosis or edema  Pulses:   2+ and symmetric all extremities  Skin:   Skin color, texture, turgor normal, no rashes or lesions. Right foot edematous with erythema, not warm, no paronychia  Lymph nodes:   Cervical, supraclavicular, and axillary nodes normal  Neurologic:   CNII-XII intact, normal strength, sensation and reflexes    throughout   Assessment:   Hallux valgus with bunions S/p corrective surgery that was complicated by paaronychia, treated with clindamycin. The toe and forefoot is still swollen and red.  Trial of meloxicam.  Consider ultrasound of LE to rule out lymphedema and VI  Encounter for health maintenance examination Annual comprehensive exam was  done including breast, excluding  pelvic and PAP smear. She is s/p TAH/BSO. All screenings have been addressed .   Breast nodule Diagnostic mammogram suggested fat necrosis as the cause for the previously noted nodule.   Hypertension Well controlled on current regimen. Renal function stable, no changes today.  Other and unspecified hyperlipidemia Lab Results  Component Value Date   CHOL 243* 12/17/2012   HDL 62.40 12/17/2012   LDLDIRECT 159.2 12/17/2012   TRIG 112.0 12/17/2012   CHOLHDL 4 12/17/2012    Her LDL is elevated at 159 on Krill oil.   New ACC guidelines recommend starting patients aged 22 or higher on moderate intensity statin therapy for LDL between 70-189 and 10 yr risk of CAD  > 7.5% ;  and high intensity therapy for anyone with LDL > 190. I will recommend trial of simvastatin 20 mg daily.    Updated Medication List Outpatient Encounter Prescriptions as of 12/17/2012  Medication Sig Dispense Refill  . cetirizine (ZYRTEC) 5 MG tablet Take 5 mg by mouth daily.      . clindamycin (CLEOCIN) 300 MG capsule Take 1 capsule by mouth 3 (three) times daily.      Marland Kitchen FLUoxetine (PROZAC) 40 MG capsule Take 1 capsule (40 mg total) by mouth daily.  90 capsule  3  . Krill Oil 300 MG CAPS Take by mouth.      . Multiple Vitamin (MULTIVITAMIN) tablet Take 1 tablet by mouth daily.      . OIL OF OREGANO PO Take by mouth.      . Probiotic Product (PROBIOTIC FORMULA PO) Take by mouth.      . valsartan-hydrochlorothiazide (DIOVAN HCT) 80-12.5 MG per tablet Take 1 tablet by mouth daily.  90 tablet  3  . meloxicam (MOBIC) 15 MG tablet Take 1 tablet (15 mg total) by mouth daily.  30 tablet  0  . QUERCETIN PO Take by mouth.      . simvastatin (ZOCOR) 20 MG tablet Take 1 tablet (20 mg total) by mouth at bedtime.  90 tablet  3   No facility-administered encounter medications on file as of 12/17/2012.

## 2012-12-17 NOTE — Assessment & Plan Note (Addendum)
S/p corrective surgery that was complicated by paaronychia, treated with clindamycin. The toe and forefoot is still swollen and red.  Trial of meloxicam.  Consider ultrasound of LE to rule out lymphedema and VI

## 2012-12-17 NOTE — Patient Instructions (Addendum)
You need the pneumonia vaccine but we are still waiting for our shipment of "Prevnar"  Please call us in a few days and plant to return to get the vaccine  Your vaginal irritation  is due to atrophic vaginitis (no infection) which should improve with vaginal estrogen  Please hold onto the coupon I have given you and wait for the samples   I sent an rx for meloxicam to [facility] to take instead of aleve and motrin for your toe swelling

## 2012-12-18 ENCOUNTER — Encounter: Payer: Self-pay | Admitting: Internal Medicine

## 2012-12-18 DIAGNOSIS — Z0000 Encounter for general adult medical examination without abnormal findings: Secondary | ICD-10-CM | POA: Insufficient documentation

## 2012-12-18 LAB — VITAMIN D 25 HYDROXY (VIT D DEFICIENCY, FRACTURES): Vit D, 25-Hydroxy: 47 ng/mL (ref 30–89)

## 2012-12-18 MED ORDER — SIMVASTATIN 20 MG PO TABS: 20.0000 mg | ORAL_TABLET | Freq: Every day | ORAL | Status: DC

## 2012-12-18 NOTE — Assessment & Plan Note (Signed)
Annual comprehensive exam was done including breast, excluding  pelvic and PAP smear. She is s/p TAH/BSO. All screenings have been addressed .

## 2012-12-18 NOTE — Assessment & Plan Note (Signed)
Diagnostic mammogram suggested fat necrosis as the cause for the previously noted nodule.

## 2012-12-18 NOTE — Assessment & Plan Note (Signed)
Well controlled on current regimen. Renal function stable, no changes today. 

## 2012-12-18 NOTE — Assessment & Plan Note (Addendum)
Lab Results  Component Value Date   CHOL 243* 12/17/2012   HDL 62.40 12/17/2012   LDLDIRECT 159.2 12/17/2012   TRIG 112.0 12/17/2012   CHOLHDL 4 12/17/2012    Her LDL is elevated at 159 on Krill oil.   New ACC guidelines recommend starting patients aged 65 or higher on moderate intensity statin therapy for LDL between 70-189 and 10 yr risk of CAD > 7.5% ;  and high intensity therapy for anyone with LDL > 190. I will recommend trial of simvastatin 20 mg daily.

## 2013-01-16 ENCOUNTER — Other Ambulatory Visit: Payer: Self-pay

## 2013-02-04 ENCOUNTER — Telehealth: Payer: Self-pay | Admitting: Internal Medicine

## 2013-02-04 NOTE — Telephone Encounter (Signed)
Yes, can schedule rn visit for vaccines

## 2013-02-04 NOTE — Telephone Encounter (Signed)
Can I  schedule patient for Nurse visit for immunizations? Or do you need to see patient last  OV 10/14

## 2013-02-04 NOTE — Telephone Encounter (Signed)
Patient stated she will call back about RN visit.

## 2013-02-04 NOTE — Telephone Encounter (Signed)
See below my chart message Is it ok to make appointment for vaccine  Appointment Request From: Rockey Situ  With Provider: Duncan Dull, MD [-Primary Care Physician-]  Preferred Date Range: Any date 02/01/2013 or later  Preferred Times: Tuesday Morning  Reason: To address the following health maintenance concerns.  Zostavax Comments:

## 2013-02-20 ENCOUNTER — Ambulatory Visit: Payer: Managed Care, Other (non HMO)

## 2013-03-11 ENCOUNTER — Other Ambulatory Visit: Payer: Self-pay | Admitting: Internal Medicine

## 2013-07-31 ENCOUNTER — Ambulatory Visit (INDEPENDENT_AMBULATORY_CARE_PROVIDER_SITE_OTHER): Payer: Managed Care, Other (non HMO) | Admitting: Internal Medicine

## 2013-07-31 ENCOUNTER — Encounter: Payer: Self-pay | Admitting: Internal Medicine

## 2013-07-31 VITALS — BP 126/80 | HR 70 | Temp 98.3°F | Wt 166.0 lb

## 2013-07-31 DIAGNOSIS — L02211 Cutaneous abscess of abdominal wall: Secondary | ICD-10-CM

## 2013-07-31 DIAGNOSIS — S30861A Insect bite (nonvenomous) of abdominal wall, initial encounter: Secondary | ICD-10-CM

## 2013-07-31 DIAGNOSIS — L03319 Cellulitis of trunk, unspecified: Secondary | ICD-10-CM

## 2013-07-31 DIAGNOSIS — W57XXXA Bitten or stung by nonvenomous insect and other nonvenomous arthropods, initial encounter: Secondary | ICD-10-CM

## 2013-07-31 DIAGNOSIS — L02219 Cutaneous abscess of trunk, unspecified: Secondary | ICD-10-CM

## 2013-07-31 DIAGNOSIS — S30860A Insect bite (nonvenomous) of lower back and pelvis, initial encounter: Secondary | ICD-10-CM

## 2013-07-31 MED ORDER — SULFAMETHOXAZOLE-TMP DS 800-160 MG PO TABS: 1.0000 | ORAL_TABLET | Freq: Two times a day (BID) | ORAL | Status: DC

## 2013-07-31 NOTE — Progress Notes (Signed)
Subjective:    Patient ID: Grace [name], female    DOB: Nov 28, 1945, 68 y.o.   MRN: [id number]  HPI  Pt presents to the clinic today wit hc/o a boil under her left breast. She noticed this about 1 week ago. It is very red and tender to touch. She has been putting hot compresses on it but it will not come to a head. She denies fever, chills or body aches.  Review of Systems      Past Medical History  Diagnosis Date  . Hyperlipidemia   . Depression     resolved  . allergic rhinitis   . GERD (gastroesophageal reflux disease)   . Hypertension   . Benign heart murmur   . Hx of colonic polyps     Current Outpatient Prescriptions  Medication Sig Dispense Refill  . cetirizine (ZYRTEC) 5 MG tablet Take 5 mg by mouth daily.      . clindamycin (CLEOCIN) 300 MG capsule Take 1 capsule by mouth 3 (three) times daily.      Marland Kitchen FLUoxetine (PROZAC) 40 MG capsule Take 1 capsule (40 mg total) by mouth daily.  90 capsule  3  . Krill Oil 300 MG CAPS Take by mouth.      . meloxicam (MOBIC) 15 MG tablet Take 1 tablet (15 mg total) by mouth daily.  30 tablet  0  . Multiple Vitamin (MULTIVITAMIN) tablet Take 1 tablet by mouth daily.      . OIL OF OREGANO PO Take by mouth.      . Probiotic Product (PROBIOTIC FORMULA PO) Take by mouth.      . QUERCETIN PO Take by mouth.      . simvastatin (ZOCOR) 20 MG tablet Take 1 tablet (20 mg total) by mouth at bedtime.  90 tablet  3  . valsartan-hydrochlorothiazide (DIOVAN HCT) 80-12.5 MG per tablet Take 1 tablet by mouth daily.  90 tablet  3  . valsartan-hydrochlorothiazide (DIOVAN-HCT) 80-12.5 MG per tablet Take 1 tablet by mouth  daily  90 tablet  1   No current facility-administered medications for this visit.    No Known Allergies  Family History  Problem Relation Age of Onset  . Mental illness Mother 29    alzheimers dementia  . Cancer Father 59    prostate  . Heart disease Father     pacemaker,  no AMI    History   Social History  . Marital  Status: Married    Spouse Name: N/A    Number of Children: N/A  . Years of Education: N/A   Occupational History  . Not on file.   Social History Main Topics  . Smoking status: Never Smoker   . Smokeless tobacco: Never Used  . Alcohol Use: No  . Drug Use: No  . Sexual Activity: Not on file   Other Topics Concern  . Not on file   Social History Narrative  . No narrative on file     Constitutional: Denies fever, malaise, fatigue, headache or abrupt weight changes.  Skin: Pt reports boil under left breast. Denies rashes, lesions or ulcercations.   No other specific complaints in a complete review of systems (except as listed in HPI above).  Objective:   Physical Exam  BP 126/80  Pulse 70  Temp(Src) 98.3 F (36.8 C) (Oral)  Wt 166 lb (75.297 kg)  SpO2 99% Wt Readings from Last 3 Encounters:  07/31/13 166 lb (75.297 kg)  12/17/12 163 lb (73.936 kg)  08/27/12 167 lb 8 oz (75.978 kg)    General: Appears her stated age, well developed, well nourished in NAD. Skin: Warm, dry and intact. Golf ball size abscess noted on left upper abdomen. Cardiovascular: Normal rate and rhythm. S1,S2 noted.  No murmur, rubs or gallops noted. No JVD or BLE edema. No carotid bruits noted. Pulmonary/Chest: Normal effort and positive vesicular breath sounds. No respiratory distress. No wheezes, rales or ronchi noted.    BMET    Component Value Date/Time   NA 141 12/17/2012 0944   K 3.9 12/17/2012 0944   CL 104 12/17/2012 0944   CO2 30 12/17/2012 0944   GLUCOSE 85 12/17/2012 0944   BUN 16 12/17/2012 0944   CREATININE 0.8 12/17/2012 0944   CREATININE 0.84 03/27/2012 0802   CALCIUM 9.3 12/17/2012 0944   GFRNONAA 74 03/27/2012 0802   GFRAA 85 03/27/2012 0802    Lipid Panel     Component Value Date/Time   CHOL 243* 12/17/2012 0944   TRIG 112.0 12/17/2012 0944   HDL 62.40 12/17/2012 0944   CHOLHDL 4 12/17/2012 0944   VLDL 22.4 12/17/2012 0944    CBC    Component Value Date/Time   WBC 7.4  12/17/2012 0944   RBC 4.37 12/17/2012 0944   HGB 13.8 12/17/2012 0944   HCT 40.9 12/17/2012 0944   PLT 249.0 12/17/2012 0944   MCV 93.7 12/17/2012 0944   MCHC 33.8 12/17/2012 0944   RDW 13.8 12/17/2012 0944   LYMPHSABS 3.3 12/17/2012 0944   MONOABS 0.3 12/17/2012 0944   EOSABS 0.1 12/17/2012 0944   BASOSABS 0.0 12/17/2012 0944    Hgb A1C No results found for this basename: HGBA1C         Assessment & Plan:  Abscess of abdomen:  Abscess I & D (see procedure note below) Instructions given on how to care for abscess eRx for septra BID x 7 days  Procedure Note:  Explained benefits and risks of procedure with patient including: bleeding, worsening infection, possible sepsis and/or death. She understands and verbally agrees to proceed  Area cleansed with betadine x 3 Aprrox 3 mL lidocaine with epi injected into the abscess Abscess incised with # 11 blade Large amount of pus obtained Area washed with sterile water, covered with triple antibiotic ointment and dressing Pt tolerated procedure wll  Of note, she did have a tick embedded on her abdomen which I also removed after the procedure.  RTC as needed or if your notice increased pain, redness, fever, or chills.

## 2013-07-31 NOTE — Progress Notes (Signed)
Pre visit review using our clinic review tool, if applicable. No additional management support is needed unless otherwise documented below in the visit note. 

## 2013-07-31 NOTE — Patient Instructions (Addendum)
Abscess An abscess is an infected area that contains a collection of pus and debris.It can occur in almost any part of the body. An abscess is also known as a furuncle or boil. CAUSES  An abscess occurs when tissue gets infected. This can occur from blockage of oil or sweat glands, infection of hair follicles, or a minor injury to the skin. As the body tries to fight the infection, pus collects in the area and creates pressure under the skin. This pressure causes pain. People with weakened immune systems have difficulty fighting infections and get certain abscesses more often.  SYMPTOMS Usually an abscess develops on the skin and becomes a painful mass that is red, warm, and tender. If the abscess forms under the skin, you may feel a moveable soft area under the skin. Some abscesses break open (rupture) on their own, but most will continue to get worse without care. The infection can spread deeper into the body and eventually into the bloodstream, causing you to feel ill.  DIAGNOSIS  Your caregiver will take your medical history and perform a physical exam. A sample of fluid may also be taken from the abscess to determine what is causing your infection. TREATMENT  Your caregiver may prescribe antibiotic medicines to fight the infection. However, taking antibiotics alone usually does not cure an abscess. Your caregiver may need to make a small cut (incision) in the abscess to drain the pus. In some cases, gauze is packed into the abscess to reduce pain and to continue draining the area. HOME CARE INSTRUCTIONS   Only take over-the-counter or prescription medicines for pain, discomfort, or fever as directed by your caregiver.  If you were prescribed antibiotics, take them as directed. Finish them even if you start to feel better.  If gauze is used, follow your caregiver's directions for changing the gauze.  To avoid spreading the infection:  Keep your draining abscess covered with a  bandage.  Wash your hands well.  Do not share personal care items, towels, or whirlpools with others.  Avoid skin contact with others.  Keep your skin and clothes clean around the abscess.  Keep all follow-up appointments as directed by your caregiver. SEEK MEDICAL CARE IF:   You have increased pain, swelling, redness, fluid drainage, or bleeding.  You have muscle aches, chills, or a general ill feeling.  You have a fever. MAKE SURE YOU:   Understand these instructions.  Will watch your condition.  Will get help right away if you are not doing well or get worse. Document Released: 12/07/2004 Document Revised: 08/29/2011 Document Reviewed: 05/12/2011 Exit[employer] Patient Information 2014 [employer].  

## 2013-08-06 ENCOUNTER — Encounter: Payer: Self-pay | Admitting: Internal Medicine

## 2013-08-06 MED ORDER — FLUOXETINE HCL 40 MG PO CAPS: 40.0000 mg | ORAL_CAPSULE | Freq: Every day | ORAL | Status: DC

## 2013-08-20 ENCOUNTER — Encounter: Payer: Self-pay | Admitting: Internal Medicine

## 2013-08-20 ENCOUNTER — Ambulatory Visit (INDEPENDENT_AMBULATORY_CARE_PROVIDER_SITE_OTHER): Payer: Managed Care, Other (non HMO) | Admitting: Internal Medicine

## 2013-08-20 VITALS — BP 136/78 | HR 60 | Temp 98.1°F | Resp 16 | Ht 65.0 in | Wt 164.5 lb

## 2013-08-20 DIAGNOSIS — E785 Hyperlipidemia, unspecified: Secondary | ICD-10-CM

## 2013-08-20 DIAGNOSIS — F329 Major depressive disorder, single episode, unspecified: Secondary | ICD-10-CM

## 2013-08-20 DIAGNOSIS — J069 Acute upper respiratory infection, unspecified: Secondary | ICD-10-CM | POA: Insufficient documentation

## 2013-08-20 DIAGNOSIS — R5383 Other fatigue: Secondary | ICD-10-CM

## 2013-08-20 DIAGNOSIS — F3289 Other specified depressive episodes: Secondary | ICD-10-CM

## 2013-08-20 DIAGNOSIS — I10 Essential (primary) hypertension: Secondary | ICD-10-CM

## 2013-08-20 DIAGNOSIS — F32A Depression, unspecified: Secondary | ICD-10-CM

## 2013-08-20 DIAGNOSIS — Z79899 Other long term (current) drug therapy: Secondary | ICD-10-CM

## 2013-08-20 DIAGNOSIS — R5381 Other malaise: Secondary | ICD-10-CM

## 2013-08-20 LAB — CBC WITH DIFFERENTIAL/PLATELET
Basophils Absolute: 0.1 10*3/uL (ref 0.0–0.1)
Basophils Relative: 1.3 % (ref 0.0–3.0)
EOS ABS: 0.2 10*3/uL (ref 0.0–0.7)
EOS PCT: 2.8 % (ref 0.0–5.0)
HEMATOCRIT: 36 % (ref 36.0–46.0)
Hemoglobin: 12.2 g/dL (ref 12.0–15.0)
LYMPHS ABS: 3.2 10*3/uL (ref 0.7–4.0)
Lymphocytes Relative: 39.5 % (ref 12.0–46.0)
MCHC: 33.9 g/dL (ref 30.0–36.0)
MCV: 93.6 fl (ref 78.0–100.0)
MONO ABS: 0.5 10*3/uL (ref 0.1–1.0)
Monocytes Relative: 5.8 % (ref 3.0–12.0)
Neutro Abs: 4.1 10*3/uL (ref 1.4–7.7)
Neutrophils Relative %: 50.6 % (ref 43.0–77.0)
PLATELETS: 212 10*3/uL (ref 150.0–400.0)
RBC: 3.84 Mil/uL — ABNORMAL LOW (ref 3.87–5.11)
RDW: 13.9 % (ref 11.5–15.5)
WBC: 8.2 10*3/uL (ref 4.0–10.5)

## 2013-08-20 LAB — COMPREHENSIVE METABOLIC PANEL
ALBUMIN: 3.9 g/dL (ref 3.5–5.2)
ALT: 20 U/L (ref 0–35)
AST: 29 U/L (ref 0–37)
Alkaline Phosphatase: 38 U/L — ABNORMAL LOW (ref 39–117)
BILIRUBIN TOTAL: 0.6 mg/dL (ref 0.2–1.2)
BUN: 14 mg/dL (ref 6–23)
CO2: 28 mEq/L (ref 19–32)
Calcium: 9.2 mg/dL (ref 8.4–10.5)
Chloride: 106 mEq/L (ref 96–112)
Creatinine, Ser: 0.7 mg/dL (ref 0.4–1.2)
GFR: 88.89 mL/min (ref >60.00)
GLUCOSE: 75 mg/dL (ref 70–99)
Potassium: 3.6 mEq/L (ref 3.5–5.1)
SODIUM: 141 meq/L (ref 135–145)
Total Protein: 6.7 g/dL (ref 6.0–8.3)

## 2013-08-20 LAB — LIPID PANEL
CHOL/HDL RATIO: 3
CHOLESTEROL: 184 mg/dL (ref 0–200)
HDL: 53.7 mg/dL (ref >39.00)
LDL Cholesterol: 107 mg/dL — ABNORMAL HIGH (ref 0–99)
NonHDL: 130.3
Triglycerides: 119 mg/dL (ref 0.0–149.0)
VLDL: 23.8 mg/dL (ref 0.0–40.0)

## 2013-08-20 MED ORDER — FLUOXETINE HCL 40 MG PO CAPS: 40.0000 mg | ORAL_CAPSULE | Freq: Every day | ORAL | Status: DC

## 2013-08-20 MED ORDER — BENZONATATE 200 MG PO CAPS: 200.0000 mg | ORAL_CAPSULE | Freq: Three times a day (TID) | ORAL | Status: DC | PRN

## 2013-08-20 MED ORDER — HYDROCOD POLST-CHLORPHEN POLST 10-8 MG/5ML PO LQCR: 5.0000 mL | Freq: Two times a day (BID) | ORAL | Status: DC | PRN

## 2013-08-20 NOTE — Progress Notes (Signed)
Patient ID: Grace [name], female   DOB: 12/14/1945, 68 y.o.   MRN: [id number]   Patient Active Problem List   Diagnosis Date Noted  . Acute upper respiratory infections of unspecified site 08/20/2013  . Encounter for health maintenance examination 12/18/2012  . Breast nodule 08/27/2012  . Hallux valgus with bunions 02/17/2012  . Allergic rhinitis due to pollen 08/13/2011  . Other and unspecified hyperlipidemia 08/13/2011  . Depression   . GERD (gastroesophageal reflux disease)   . Hypertension   . Benign heart murmur   . Hx of colonic polyps     Subjective:  CC:   Chief Complaint  Patient presents with  . Follow-up    medication refills patient fasting for labs.    HPI:   Siearra Amberg is a 68 y.o. female who presents for 6 month follow up on hypertension , hyperlipidemia and GERD. She was last seen in October   Treated for sinusitis  On  Monday by Urgent care  For 2 day history of symptoms of  sore throat and  Cough.  The  cough is keeping her up at night  And she is still congested    Past Medical History  Diagnosis Date  . Hyperlipidemia   . Depression     resolved  . allergic rhinitis   . GERD (gastroesophageal reflux disease)   . Hypertension   . Benign heart murmur   . Hx of colonic polyps     Past Surgical History  Procedure Laterality Date  . Abdominal hysterectomy    . Total abdominal hysterectomy w/ bilateral salpingoophorectomy  1991    excessive bleeding       The following portions of the patient's history were reviewed and updated as appropriate: Allergies, current medications, and problem list.    Review of Systems:   Patient denies headache, fevers, malaise, unintentional weight loss, skin rash, eye pain, sinus congestion and sinus pain, sore throat, dysphagia,  hemoptysis , cough, dyspnea, wheezing, chest pain, palpitations, orthopnea, edema, abdominal pain, nausea, melena, diarrhea, constipation, flank pain, dysuria, hematuria, urinary   Frequency, nocturia, numbness, tingling, seizures,  Focal weakness, Loss of consciousness,  Tremor, insomnia, depression, anxiety, and suicidal ideation.     History   Social History  . Marital Status: Married    Spouse Name: N/A    Number of Children: N/A  . Years of Education: N/A   Occupational History  . Not on file.   Social History Main Topics  . Smoking status: Never Smoker   . Smokeless tobacco: Never Used  . Alcohol Use: No  . Drug Use: No  . Sexual Activity: Not on file   Other Topics Concern  . Not on file   Social History Narrative  . No narrative on file    Objective:  Filed Vitals:   08/20/13 0933  BP: 136/78  Pulse: 60  Temp: 98.1 F (36.7 C)  Resp: 16     General appearance: alert, cooperative and appears stated age Ears: normal TM's and external ear canals both ears Throat: lips, mucosa, and tongue normal; teeth and gums normal Neck: no adenopathy, no carotid bruit, supple, symmetrical, trachea midline and thyroid not enlarged, symmetric, no tenderness/mass/nodules Back: symmetric, no curvature. ROM normal. No CVA tenderness. Lungs: clear to auscultation bilaterally Heart: regular rate and rhythm, S1, S2 normal, no murmur, click, rub or gallop Abdomen: soft, non-tender; bowel sounds normal; no masses,  no organomegaly Pulses: 2+ and symmetric Skin: Skin color, texture, turgor normal. No rashes  or lesions Lymph nodes: Cervical, supraclavicular, and axillary nodes normal.  Assessment and Plan:  Depression Recurrent, managed with prozac.  Refills given   Acute upper respiratory infections of unspecified site Given azithromycin by Urgent care 2 days ago for symptoms suggestive of viral URI.  Cc is persistnet cough.  tussionex and tessalon prescribed,  salline rinse, benadryl and sudafed   Hypertension Well controlled on current regimen. Renal function stable, no changes today.  Lab Results  Component Value Date   CREATININE 0.7 08/20/2013    Lab Results  Component Value Date   NA 141 08/20/2013   K 3.6 08/20/2013   CL 106 08/20/2013   CO2 28 08/20/2013     Other and unspecified hyperlipidemia Thus far managed with diet and krill oil.  Lab Results  Component Value Date   CHOL 184 08/20/2013   HDL 53.70 08/20/2013   LDLCALC 107* 08/20/2013   LDLDIRECT 159.2 12/17/2012   TRIG 119.0 08/20/2013   CHOLHDL 3 08/20/2013    Updated Medication List Outpatient Encounter Prescriptions as of 08/20/2013  Medication Sig  . azithromycin (ZITHROMAX) 250 MG tablet Take 1 tablet by mouth daily.  . cetirizine (ZYRTEC) 5 MG tablet Take 5 mg by mouth daily.  Marland Kitchen FLUoxetine (PROZAC) 40 MG capsule Take 1 capsule (40 mg total) by mouth daily.  . Multiple Vitamin (MULTIVITAMIN) tablet Take 1 tablet by mouth daily.  . Probiotic Product (PROBIOTIC FORMULA PO) Take by mouth.  . valsartan-hydrochlorothiazide (DIOVAN-HCT) 80-12.5 MG per tablet Take 1 tablet by mouth  daily  . [DISCONTINUED] FLUoxetine (PROZAC) 40 MG capsule Take 1 capsule (40 mg total) by mouth daily.  . benzonatate (TESSALON) 200 MG capsule Take 1 capsule (200 mg total) by mouth 3 (three) times daily as needed for cough.  . chlorpheniramine-HYDROcodone (TUSSIONEX PENNKINETIC ER) 10-8 MG/5ML LQCR Take 5 mLs by mouth every 12 (twelve) hours as needed for cough.  . QUERCETIN PO Take by mouth.  . sulfamethoxazole-trimethoprim (BACTRIM DS) 800-160 MG per tablet Take 1 tablet by mouth 2 (two) times daily.     Orders Placed This Encounter  Procedures  . Comprehensive metabolic panel  . CBC with Differential  . Lipid panel    No Follow-up on file.

## 2013-08-20 NOTE — Progress Notes (Signed)
Pre-visit discussion using our clinic review tool. No additional management support is needed unless otherwise documented below in the visit note.  

## 2013-08-20 NOTE — Assessment & Plan Note (Signed)
Recurrent, managed with prozac.  Refills given

## 2013-08-20 NOTE — Assessment & Plan Note (Addendum)
Given azithromycin by Urgent care 2 days ago for symptoms suggestive of viral URI.  Cc is persistnet cough.  tussionex and tessalon prescribed,  salline rinse, benadryl and sudafed

## 2013-08-20 NOTE — Patient Instructions (Signed)
.    The post nasal drip is causing your sore throat.  Lavage your sinuses twice daily with Simply saline nasal spray.  Use benadryl 25 mg at bedtime and Sudafed PE 10 to 30 mg every 8 hours to manage the drainage and congestion.  Gargle with salt water often for the sore throat.  I am prescribing cough syrup (with hydrocodone ) for the night time cough. And tessalon perles for daytime cough

## 2013-08-21 ENCOUNTER — Encounter: Payer: Self-pay | Admitting: Internal Medicine

## 2013-08-21 NOTE — Assessment & Plan Note (Addendum)
Thus far managed with diet and krill oil.  Lab Results  Component Value Date   CHOL 184 08/20/2013   HDL 53.70 08/20/2013   LDLCALC 107* 08/20/2013   LDLDIRECT 159.2 12/17/2012   TRIG 119.0 08/20/2013   CHOLHDL 3 08/20/2013

## 2013-08-21 NOTE — Assessment & Plan Note (Signed)
Well controlled on current regimen. Renal function stable, no changes today.  Lab Results  Component Value Date   CREATININE 0.7 08/20/2013   Lab Results  Component Value Date   NA 141 08/20/2013   K 3.6 08/20/2013   CL 106 08/20/2013   CO2 28 08/20/2013

## 2013-10-20 ENCOUNTER — Encounter: Payer: Self-pay | Admitting: Internal Medicine

## 2013-10-20 MED ORDER — VALSARTAN-HYDROCHLOROTHIAZIDE 80-12.5 MG PO TABS: ORAL_TABLET | ORAL | Status: DC

## 2014-03-22 ENCOUNTER — Other Ambulatory Visit: Payer: Self-pay | Admitting: Internal Medicine

## 2014-04-06 NOTE — Telephone Encounter (Signed)
Mailed unread message to pt  

## 2014-05-14 ENCOUNTER — Encounter: Payer: Self-pay | Admitting: Internal Medicine

## 2014-05-14 ENCOUNTER — Telehealth: Payer: Self-pay | Admitting: Internal Medicine

## 2014-05-14 ENCOUNTER — Ambulatory Visit (INDEPENDENT_AMBULATORY_CARE_PROVIDER_SITE_OTHER): Payer: Managed Care, Other (non HMO) | Admitting: Internal Medicine

## 2014-05-14 VITALS — BP 128/84 | HR 68 | Temp 98.1°F | Resp 16 | Ht 65.0 in | Wt 169.5 lb

## 2014-05-14 DIAGNOSIS — Z1239 Encounter for other screening for malignant neoplasm of breast: Secondary | ICD-10-CM

## 2014-05-14 DIAGNOSIS — Z23 Encounter for immunization: Secondary | ICD-10-CM

## 2014-05-14 DIAGNOSIS — R0789 Other chest pain: Secondary | ICD-10-CM

## 2014-05-14 DIAGNOSIS — E785 Hyperlipidemia, unspecified: Secondary | ICD-10-CM

## 2014-05-14 DIAGNOSIS — K5909 Other constipation: Secondary | ICD-10-CM

## 2014-05-14 DIAGNOSIS — I10 Essential (primary) hypertension: Secondary | ICD-10-CM

## 2014-05-14 DIAGNOSIS — K5900 Constipation, unspecified: Secondary | ICD-10-CM | POA: Insufficient documentation

## 2014-05-14 DIAGNOSIS — Z1211 Encounter for screening for malignant neoplasm of colon: Secondary | ICD-10-CM

## 2014-05-14 LAB — COMPREHENSIVE METABOLIC PANEL
ALBUMIN: 4.3 g/dL (ref 3.5–5.2)
ALT: 23 U/L (ref 0–35)
AST: 23 U/L (ref 0–37)
Alkaline Phosphatase: 47 U/L (ref 39–117)
BUN: 19 mg/dL (ref 6–23)
CO2: 30 mEq/L (ref 19–32)
Calcium: 9.5 mg/dL (ref 8.4–10.5)
Chloride: 105 mEq/L (ref 96–112)
Creatinine, Ser: 1.03 mg/dL (ref 0.40–1.20)
GFR: 56.8 mL/min — AB (ref >60.00)
GLUCOSE: 97 mg/dL (ref 70–99)
POTASSIUM: 4.2 meq/L (ref 3.5–5.1)
Sodium: 140 mEq/L (ref 135–145)
Total Bilirubin: 0.5 mg/dL (ref 0.2–1.2)
Total Protein: 6.9 g/dL (ref 6.0–8.3)

## 2014-05-14 LAB — LIPID PANEL
Cholesterol: 218 mg/dL — ABNORMAL HIGH (ref 0–200)
HDL: 57.5 mg/dL (ref >39.00)
LDL CALC: 135 mg/dL — AB (ref 0–99)
NonHDL: 160.5
TRIGLYCERIDES: 126 mg/dL (ref 0.0–149.0)
Total CHOL/HDL Ratio: 4
VLDL: 25.2 mg/dL (ref 0.0–40.0)

## 2014-05-14 MED ORDER — FLUOXETINE HCL 40 MG PO CAPS: 40.0000 mg | ORAL_CAPSULE | Freq: Every day | ORAL | Status: DC

## 2014-05-14 MED ORDER — VALSARTAN-HYDROCHLOROTHIAZIDE 80-12.5 MG PO TABS: ORAL_TABLET | ORAL | Status: DC

## 2014-05-14 MED ORDER — RANITIDINE HCL 300 MG PO CAPS: 300.0000 mg | ORAL_CAPSULE | Freq: Every evening | ORAL | Status: DC

## 2014-05-14 NOTE — Telephone Encounter (Signed)
emmi emailed °

## 2014-05-14 NOTE — Patient Instructions (Addendum)
For your atypical chest painL  Try using zantac 150 mg twice daily or 300 mg once daily every day for chest pressure symptoms that may be due to reflux/gas  also try using beano with any meal containing vegetables to prevent gas   If no improvement,  please  let me order a stress test   Mammogram ordered    Annual exam 6 months

## 2014-05-17 ENCOUNTER — Encounter: Payer: Self-pay | Admitting: Internal Medicine

## 2014-05-17 DIAGNOSIS — R0789 Other chest pain: Secondary | ICD-10-CM | POA: Insufficient documentation

## 2014-05-17 NOTE — Progress Notes (Signed)
Patient ID: Grace [name], female   DOB: 1945-06-11, 69 y.o.   MRN: [id number]  Patient Active Problem List   Diagnosis Date Noted  . Chest pain, atypical 05/17/2014  . Constipation 05/14/2014  . Encounter for health maintenance examination 12/18/2012  . Breast nodule 08/27/2012  . Hallux valgus with bunions 02/17/2012  . Allergic rhinitis due to pollen 08/13/2011  . Hyperlipidemia 08/13/2011  . Depression   . GERD (gastroesophageal reflux disease)   . Hypertension   . Benign heart murmur   . Hx of colonic polyps     Subjective:  CC:   Chief Complaint  Patient presents with  . Follow-up    medication refills    HPI:   Grace [name] is a 69 y.o. female who presents for follow up on hypertension, hyperlipidemia and GERD.  Has been having episodes of f SSCP at rest that last for several minutes to hours,  Feel better with belching,  Not brought on with exercise or physical activity and not accompanied by diaphoresis or dyspnea. Not occurring daily,  Started abut 1 month ago.   Past Medical History  Diagnosis Date  . Hyperlipidemia   . Depression     resolved  . allergic rhinitis   . GERD (gastroesophageal reflux disease)   . Hypertension   . Benign heart murmur   . Hx of colonic polyps     Past Surgical History  Procedure Laterality Date  . Abdominal hysterectomy    . Total abdominal hysterectomy w/ bilateral salpingoophorectomy  1991    excessive bleeding       The following portions of the patient's history were reviewed and updated as appropriate: Allergies, current medications, and problem list.    Review of Systems:   Patient denies headache, fevers, malaise, unintentional weight loss, skin rash, eye pain, sinus congestion and sinus pain, sore throat, dysphagia,  hemoptysis , cough, dyspnea, wheezing, chest pain, palpitations, orthopnea, edema, abdominal pain, nausea, melena, diarrhea, constipation, flank pain, dysuria, hematuria, urinary  Frequency,  nocturia, numbness, tingling, seizures,  Focal weakness, Loss of consciousness,  Tremor, insomnia, depression, anxiety, and suicidal ideation.     History   Social History  . Marital Status: Married    Spouse Name: N/A  . Number of Children: N/A  . Years of Education: N/A   Occupational History  . Not on file.   Social History Main Topics  . Smoking status: Never Smoker   . Smokeless tobacco: Never Used  . Alcohol Use: No  . Drug Use: No  . Sexual Activity: Not on file   Other Topics Concern  . Not on file   Social History Narrative    Objective:  Filed Vitals:   05/14/14 0812  BP: 128/84  Pulse: 68  Temp: 98.1 F (36.7 C)  Resp: 16     General appearance: alert, cooperative and appears stated age Ears: normal TM's and external ear canals both ears Throat: lips, mucosa, and tongue normal; teeth and gums normal Neck: no adenopathy, no carotid bruit, supple, symmetrical, trachea midline and thyroid not enlarged, symmetric, no tenderness/mass/nodules Back: symmetric, no curvature. ROM normal. No CVA tenderness. Lungs: clear to auscultation bilaterally Heart: regular rate and rhythm, S1, S2 normal, no murmur, click, rub or gallop Abdomen: soft, non-tender; bowel sounds normal; no masses,  no organomegaly Pulses: 2+ and symmetric Skin: Skin color, texture, turgor normal. No rashes or lesions Lymph nodes: Cervical, supraclavicular, and axillary nodes normal.  Assessment and Plan:  Hyperlipidemia Thus far  managed with diet and krill oil.\.Based on current lipid profile, the risk of clinically significant CAD is 12% over the next 10 years, using the Framingham risk calculator. The [employer] of [employer] recommends starting patients aged 21 or higher on moderate intensity statin therapy for LDL between 70-189 and 10 yr risk of CAD > 7.5% ;  and high intensity therapy for anyone with LDL > 190.   Will recommend trial of simvastatin as moderate intensity therapy.    Lab Results  Component Value Date   CHOL 218* 05/14/2014   HDL 57.50 05/14/2014   LDLCALC 135* 05/14/2014   LDLDIRECT 159.2 12/17/2012   TRIG 126.0 05/14/2014   CHOLHDL 4 05/14/2014       Chest pain, atypical Trial of H2 blocker bid for treatment of what be reflux .    If no improvement  Stress test recommended.   A total of 25 minutes of face to face time was spent with patient more than half of which was spent in counselling on the above mentioned issues.  Updated Medication List Outpatient Encounter Prescriptions as of 05/14/2014  Medication Sig  . cetirizine (ZYRTEC) 5 MG tablet Take 5 mg by mouth daily.  Marland Kitchen FLUoxetine (PROZAC) 40 MG capsule Take 1 capsule (40 mg total) by mouth daily.  . Multiple Vitamin (MULTIVITAMIN) tablet Take 1 tablet by mouth daily.  . Probiotic Product (PROBIOTIC FORMULA PO) Take by mouth.  . QUERCETIN PO Take by mouth.  . valsartan-hydrochlorothiazide (DIOVAN-HCT) 80-12.5 MG per tablet Take 1 tablet by mouth  daily  . [DISCONTINUED] FLUoxetine (PROZAC) 40 MG capsule TAKE ONE CAPSULE BY MOUTH ONCE DAILY  . [DISCONTINUED] valsartan-hydrochlorothiazide (DIOVAN-HCT) 80-12.5 MG per tablet Take 1 tablet by mouth  daily  . ranitidine (ZANTAC) 300 MG capsule Take 1 capsule (300 mg total) by mouth every evening.  . [DISCONTINUED] azithromycin (ZITHROMAX) 250 MG tablet Take 1 tablet by mouth daily.  . [DISCONTINUED] benzonatate (TESSALON) 200 MG capsule Take 1 capsule (200 mg total) by mouth 3 (three) times daily as needed for cough.  . [DISCONTINUED] chlorpheniramine-HYDROcodone (TUSSIONEX PENNKINETIC ER) 10-8 MG/5ML LQCR Take 5 mLs by mouth every 12 (twelve) hours as needed for cough.  . [DISCONTINUED] sulfamethoxazole-trimethoprim (BACTRIM DS) 800-160 MG per tablet Take 1 tablet by mouth 2 (two) times daily.     Orders Placed This Encounter  Procedures  . MM DIGITAL SCREENING BILATERAL  . Pneumococcal conjugate vaccine 13-valent  . Comprehensive  metabolic panel  . Lipid panel  . Ambulatory referral to Gastroenterology    Return in about 6 months (around 11/14/2014).

## 2014-05-17 NOTE — Assessment & Plan Note (Signed)
Tirial of anti reflux medications.  If no improvement  Stress test recommended.

## 2014-05-17 NOTE — Assessment & Plan Note (Signed)
Thus far managed with diet and krill oil.\.Based on current lipid profile, the risk of clinically significant CAD is 12% over the next 10 years, using the Framingham risk calculator. The [employer] recommends starting patients aged 67 or higher on moderate intensity statin therapy for LDL between 70-189 and 10 yr risk of CAD > 7.5% ;  and high intensity therapy for anyone with LDL > 190.   Will recommend trial of simvastatin as moderate intensity therapy.   Lab Results  Component Value Date   CHOL 218* 05/14/2014   HDL 57.50 05/14/2014   LDLCALC 135* 05/14/2014   LDLDIRECT 159.2 12/17/2012   TRIG 126.0 05/14/2014   CHOLHDL 4 05/14/2014

## 2014-06-01 LAB — HM MAMMOGRAPHY: HM Mammogram: NEGATIVE

## 2014-07-29 ENCOUNTER — Ambulatory Visit
Admission: RE | Admit: 2014-07-29 | Discharge: 2014-07-29 | Disposition: A | Discharge status: Home / Self Care | Payer: Managed Care, Other (non HMO) | Source: Ambulatory Visit | Attending: Internal Medicine | Admitting: Internal Medicine

## 2014-07-29 ENCOUNTER — Ambulatory Visit (INDEPENDENT_AMBULATORY_CARE_PROVIDER_SITE_OTHER): Payer: Managed Care, Other (non HMO) | Admitting: Internal Medicine

## 2014-07-29 ENCOUNTER — Encounter: Payer: Self-pay | Admitting: Internal Medicine

## 2014-07-29 VITALS — BP 118/78 | HR 71 | Temp 98.4°F | Resp 14 | Ht 66.0 in | Wt 168.8 lb

## 2014-07-29 DIAGNOSIS — F32A Depression, unspecified: Secondary | ICD-10-CM

## 2014-07-29 DIAGNOSIS — F329 Major depressive disorder, single episode, unspecified: Secondary | ICD-10-CM

## 2014-07-29 DIAGNOSIS — I10 Essential (primary) hypertension: Secondary | ICD-10-CM | POA: Diagnosis not present

## 2014-07-29 DIAGNOSIS — R2242 Localized swelling, mass and lump, left lower limb: Secondary | ICD-10-CM | POA: Diagnosis not present

## 2014-07-29 DIAGNOSIS — Z889 Allergy status to unspecified drugs, medicaments and biological substances status: Secondary | ICD-10-CM

## 2014-07-29 DIAGNOSIS — K219 Gastro-esophageal reflux disease without esophagitis: Secondary | ICD-10-CM

## 2014-07-29 DIAGNOSIS — Z79899 Other long term (current) drug therapy: Secondary | ICD-10-CM

## 2014-07-29 DIAGNOSIS — T466X5A Adverse effect of antihyperlipidemic and antiarteriosclerotic drugs, initial encounter: Secondary | ICD-10-CM | POA: Insufficient documentation

## 2014-07-29 DIAGNOSIS — Z789 Other specified health status: Secondary | ICD-10-CM

## 2014-07-29 LAB — COMPREHENSIVE METABOLIC PANEL
ALT: 27 U/L (ref 0–35)
AST: 24 U/L (ref 0–37)
Albumin: 4.2 g/dL (ref 3.5–5.2)
Alkaline Phosphatase: 51 U/L (ref 39–117)
BILIRUBIN TOTAL: 0.4 mg/dL (ref 0.2–1.2)
BUN: 18 mg/dL (ref 6–23)
CALCIUM: 9.5 mg/dL (ref 8.4–10.5)
CHLORIDE: 104 meq/L (ref 96–112)
CO2: 28 mEq/L (ref 19–32)
CREATININE: 1 mg/dL (ref 0.40–1.20)
GFR: 58.73 mL/min — ABNORMAL LOW (ref >60.00)
Glucose, Bld: 94 mg/dL (ref 70–99)
POTASSIUM: 3.9 meq/L (ref 3.5–5.1)
Sodium: 138 mEq/L (ref 135–145)
Total Protein: 6.7 g/dL (ref 6.0–8.3)

## 2014-07-29 NOTE — Progress Notes (Signed)
Patient ID: Grace [name], female   DOB: Aug 28, 1945, 69 y.o.   MRN: [id number]

## 2014-07-29 NOTE — Progress Notes (Signed)
Pre-visit discussion using our clinic review tool. No additional management support is needed unless otherwise documented below in the visit note.  

## 2014-07-29 NOTE — Patient Instructions (Signed)
The mass on your foot may be a ganglionic cyst.    The x ray is to [place] out a callous or mass on the bone.   We are repeating you kidney function today since there was a subtle change last time

## 2014-08-01 DIAGNOSIS — R2242 Localized swelling, mass and lump, left lower limb: Secondary | ICD-10-CM | POA: Insufficient documentation

## 2014-08-01 NOTE — Assessment & Plan Note (Signed)
Recurrent, managed with prozac.  Patient prefers to remain pn current medication given recurrence of symptoms with prior  weaning trials. Refills given

## 2014-08-01 NOTE — Assessment & Plan Note (Signed)
Managed with ranitidine.  No changes today

## 2014-08-01 NOTE — Assessment & Plan Note (Signed)
Well controlled on current regimen. Renal function stable, no changes today. 

## 2014-08-01 NOTE — Assessment & Plan Note (Signed)
Plain films were done today to rule out bone mass and were negative,  Advised to observe fro now unless it becomes painful or limits ROM,  Likely a lipoma or ganglionic cyst.

## 2014-08-01 NOTE — Progress Notes (Signed)
Subjective:  Patient ID: Grace [name], female    DOB: 05/31/1945  Age: 69 y.o. MRN: [id number]  CC: The primary encounter diagnosis was Mass of foot or toe, left. Diagnoses of Statin intolerance, Long-term use of high-risk medication, Essential hypertension, Depression, Mass of left foot, and Gastroesophageal reflux disease without esophagitis were also pertinent to this visit.  HPI Grace [name] presents for follow up on hypertension, chronic depression, and new development of painless lump on dorsum of left foot .Marland Kitchen  She is toleratimg her medications without side effects and periodically checks er BP away from the office.  The majority of readings are < 140/80.  She is sleeping well and denies anhedonia, social isolation,  Anxiety and suicidal ideation.  She has tried weaning from prozac in the past with recurrence of negative symptoms     The mass was noticed 2 or 3 weeks ago and did not origiiate from blunt trauma, insect bite,  Or surgery.  It has grown slightly larger,  It is not painful.  Outpatient Prescriptions Prior to Visit  Medication Sig Dispense Refill  . cetirizine (ZYRTEC) 5 MG tablet Take 5 mg by mouth daily.    Marland Kitchen FLUoxetine (PROZAC) 40 MG capsule Take 1 capsule (40 mg total) by mouth daily. 90 capsule 1  . Multiple Vitamin (MULTIVITAMIN) tablet Take 1 tablet by mouth daily.    . Probiotic Product (PROBIOTIC FORMULA PO) Take by mouth.    . QUERCETIN PO Take by mouth.    . ranitidine (ZANTAC) 300 MG capsule Take 1 capsule (300 mg total) by mouth every evening. 30 capsule 2  . valsartan-hydrochlorothiazide (DIOVAN-HCT) 80-12.5 MG per tablet Take 1 tablet by mouth  daily 90 tablet 1   No facility-administered medications prior to visit.    Review of Systems;  Patient denies headache, fevers, malaise, unintentional weight loss, skin rash, eye pain, sinus congestion and sinus pain, sore throat, dysphagia,  hemoptysis , cough, dyspnea, wheezing, chest pain, palpitations,  orthopnea, edema, abdominal pain, nausea, melena, diarrhea, constipation, flank pain, dysuria, hematuria, urinary  Frequency, nocturia, numbness, tingling, seizures,  Focal weakness, Loss of consciousness,  Tremor, insomnia, depression, anxiety, and suicidal ideation.      Objective:  BP 118/78 mmHg  Pulse 71  Temp(Src) 98.4 F (36.9 C) (Oral)  Resp 14  Ht '5\' 6"'  (1.676 m)  Wt 168 lb 12 oz (76.544 kg)  BMI 27.25 kg/m2  SpO2 96%  BP Readings from Last 3 Encounters:  07/29/14 118/78  05/14/14 128/84  08/20/13 136/78    Wt Readings from Last 3 Encounters:  07/29/14 168 lb 12 oz (76.544 kg)  05/14/14 169 lb 8 oz (76.885 kg)  08/20/13 164 lb 8 oz (74.617 kg)    General appearance: alert, cooperative and appears stated age Ears: normal TM's and external ear canals both ears Throat: lips, mucosa, and tongue normal; teeth and gums normal Neck: no adenopathy, no carotid bruit, supple, symmetrical, trachea midline and thyroid not enlarged, symmetric, no tenderness/mass/nodules Back: symmetric, no curvature. ROM normal. No CVA tenderness. Lungs: clear to auscultation bilaterally Heart: regular rate and rhythm, S1, S2 normal, no murmur, click, rub or gallop Abdomen: soft, non-tender; bowel sounds normal; no masses,  no organomegaly Pulses: 2+ and symmetric Skin: firm found mass on top fo left foot,  Not attached to any observed tendon.  Nontender, not pulsatile. Skin color, texture, turgor normal. No rashes or lesions Lymph nodes: Cervical, supraclavicular, and axillary nodes normal. Psych: affect normal, makes good eye  contact. No fidgeting,  Smiles easily.  Denies suicidal thoughts   No results found for: HGBA1C  Lab Results  Component Value Date   CREATININE 1.00 07/29/2014   CREATININE 1.03 05/14/2014   CREATININE 0.7 08/20/2013    Lab Results  Component Value Date   WBC 8.2 08/20/2013   HGB 12.2 08/20/2013   HCT 36.0 08/20/2013   PLT 212.0 08/20/2013   GLUCOSE 94  07/29/2014   CHOL 218* 05/14/2014   TRIG 126.0 05/14/2014   HDL 57.50 05/14/2014   LDLDIRECT 159.2 12/17/2012   LDLCALC 135* 05/14/2014   ALT 27 07/29/2014   AST 24 07/29/2014   NA 138 07/29/2014   K 3.9 07/29/2014   CL 104 07/29/2014   CREATININE 1.00 07/29/2014   BUN 18 07/29/2014   CO2 28 07/29/2014   TSH 1.54 12/17/2012    Dg Foot Complete Left  07/29/2014   CLINICAL DATA:  Soft tissue mass on the dorsum of the foot in the region of the third and fourth metatarsals  EXAM: LEFT FOOT - COMPLETE 3+ VIEW  COMPARISON:  None.  FINDINGS: Changes of prior osteotomy of the distal left first metatarsal are noted with two screws present and some deformity. There is mild degenerative change of the left first MTP joint. No acute fracture is seen. No erosion is. On the lateral view there is slight soft tissue prominence over the dorsum of the foot but no underlying bony structure or calcification is seen.  IMPRESSION: Soft tissue prominence is noted over the dorsum of the foot but no bony protrusion or soft tissue calcification is seen.   Electronically Signed   By: Ivar Drape M.D.   On: 07/29/2014 09:39    Assessment & Plan:   Problem List Items Addressed This Visit    Depression    Recurrent, managed with prozac.  Patient prefers to remain pn current medication given recurrence of symptoms with prior  weaning trials. Refills given         GERD (gastroesophageal reflux disease)    Managed with ranitidine.  No changes today       Hypertension    Well controlled on current regimen. Renal function stable, no changes today.        Mass of left foot    Plain films were done today to rule out bone mass and were negative,  Advised to observe fro now unless it becomes painful or limits ROM,  Likely a lipoma or ganglionic cyst.       Statin intolerance    Other Visit Diagnoses    Mass of foot or toe, left    -  Primary    Relevant Orders    DG Foot Complete Left (Completed)     Long-term use of high-risk medication        Relevant Orders    Comp Met (CMET) (Completed)       I am having Ms. Anschutz maintain her cetirizine, multivitamin, QUERCETIN PO, Probiotic Product (PROBIOTIC FORMULA PO), valsartan-hydrochlorothiazide, FLUoxetine, and ranitidine.  No orders of the defined types were placed in this encounter.    There are no discontinued medications.  Follow-up: No Follow-up on file.   Crecencio Mc, MD

## 2014-10-12 ENCOUNTER — Ambulatory Visit
Admission: RE | Admit: 2014-10-12 | Discharge: 2014-10-12 | Disposition: A | Discharge status: Home / Self Care | Payer: Managed Care, Other (non HMO) | Source: Ambulatory Visit | Attending: Nurse Practitioner | Admitting: Nurse Practitioner

## 2014-10-12 ENCOUNTER — Ambulatory Visit (INDEPENDENT_AMBULATORY_CARE_PROVIDER_SITE_OTHER): Payer: Managed Care, Other (non HMO) | Admitting: Nurse Practitioner

## 2014-10-12 ENCOUNTER — Encounter: Payer: Self-pay | Admitting: Nurse Practitioner

## 2014-10-12 ENCOUNTER — Encounter: Payer: Self-pay | Admitting: Internal Medicine

## 2014-10-12 VITALS — BP 162/105 | HR 80 | Temp 98.3°F | Resp 16 | Ht 66.0 in | Wt 168.2 lb

## 2014-10-12 DIAGNOSIS — M7989 Other specified soft tissue disorders: Secondary | ICD-10-CM

## 2014-10-12 NOTE — Progress Notes (Signed)
   Subjective:    Patient ID: Grace [name], female    DOB: [birth year], 69 y.o.   MRN: [id number]  HPI   [name] is a 69 yo female with a CC of rash x 5 days.   1) Rash- red blotches 4-5 inches, warm, swelling, on outside back of right calf. Tender to the touch, warm water from shower makes it feel like needles pricking the skin. Started last Thursday, thought it was a bug bite medially then it progressed to the lateral side.   Hx venous insufficiency  Denies recent travel or being sedentary. She has tried elevating legs and tylenol/ibuprofen- with some relief.   Review of Systems  Constitutional: Negative for fever, chills, diaphoresis and fatigue.  Respiratory: Negative for cough, chest tightness, shortness of breath and wheezing.   Cardiovascular: Positive for leg swelling. Negative for chest pain and palpitations.  Gastrointestinal: Negative for nausea, vomiting and diarrhea.  Skin: Positive for color change. Negative for rash.      Objective:   Physical Exam  Constitutional: She is oriented to person, place, and time. She appears well-developed and well-nourished. No distress.  BP 162/105 mmHg  Pulse 80  Temp(Src) 98.3 F (36.8 C)  Resp 16  Ht 5\' 6"  (1.676 m)  Wt 168 lb 3.2 oz (76.295 kg)  BMI 27.16 kg/m2  SpO2 98%  Pt reports increased stress and traffic trying to get to appointment.   HENT:  Head: Normocephalic and atraumatic.  Right Ear: External ear normal.  Left Ear: External ear normal.  Cardiovascular: Normal rate, regular rhythm, normal heart sounds and intact distal pulses.  Exam reveals no gallop and no friction rub.   No murmur heard. Pulmonary/Chest: Effort normal and breath sounds normal. No respiratory distress. She has no wheezes. She has no rales. She exhibits no tenderness.  Musculoskeletal: Normal range of motion. She exhibits edema and tenderness.       Legs: Non-pitting ankle swelling bilaterally similar   Neurological: She is alert and  oriented to person, place, and time. No cranial nerve deficit. She exhibits normal muscle tone. Coordination normal.  Skin: Skin is warm and dry. No rash noted. She is not diaphoretic.  Psychiatric: She has a normal mood and affect. Her behavior is normal. Judgment and thought content normal.      Assessment & Plan:

## 2014-10-12 NOTE — Progress Notes (Signed)
Pre visit review using our clinic review tool, if applicable. No additional management support is needed unless otherwise documented below in the visit note. 

## 2014-10-12 NOTE — Patient Instructions (Signed)
Our referral coordinator will give you further directions.   We will follow up after results.

## 2014-10-12 NOTE — Assessment & Plan Note (Signed)
Right leg swelling and discomfort. Worsening. Will obtain STAT US Right LE. Want to r/out clot. Will follow after results.

## 2014-11-02 ENCOUNTER — Other Ambulatory Visit: Payer: Self-pay | Admitting: Internal Medicine

## 2014-11-25 ENCOUNTER — Encounter: Payer: Self-pay | Admitting: Internal Medicine

## 2014-11-25 ENCOUNTER — Ambulatory Visit (INDEPENDENT_AMBULATORY_CARE_PROVIDER_SITE_OTHER): Payer: Managed Care, Other (non HMO) | Admitting: Internal Medicine

## 2014-11-25 VITALS — BP 126/78 | HR 63 | Temp 98.2°F | Resp 14 | Ht 65.75 in | Wt 167.4 lb

## 2014-11-25 DIAGNOSIS — M21619 Bunion of unspecified foot: Secondary | ICD-10-CM

## 2014-11-25 DIAGNOSIS — Z113 Encounter for screening for infections with a predominantly sexual mode of transmission: Secondary | ICD-10-CM | POA: Diagnosis not present

## 2014-11-25 DIAGNOSIS — I10 Essential (primary) hypertension: Secondary | ICD-10-CM

## 2014-11-25 DIAGNOSIS — E559 Vitamin D deficiency, unspecified: Secondary | ICD-10-CM

## 2014-11-25 DIAGNOSIS — E785 Hyperlipidemia, unspecified: Secondary | ICD-10-CM | POA: Diagnosis not present

## 2014-11-25 DIAGNOSIS — Z0000 Encounter for general adult medical examination without abnormal findings: Secondary | ICD-10-CM

## 2014-11-25 DIAGNOSIS — R5383 Other fatigue: Secondary | ICD-10-CM | POA: Diagnosis not present

## 2014-11-25 DIAGNOSIS — K219 Gastro-esophageal reflux disease without esophagitis: Secondary | ICD-10-CM

## 2014-11-25 DIAGNOSIS — M2010 Hallux valgus (acquired), unspecified foot: Secondary | ICD-10-CM

## 2014-11-25 LAB — COMPREHENSIVE METABOLIC PANEL
ALK PHOS: 46 U/L (ref 39–117)
ALT: 20 U/L (ref 0–35)
AST: 20 U/L (ref 0–37)
Albumin: 4.3 g/dL (ref 3.5–5.2)
BUN: 16 mg/dL (ref 6–23)
CO2: 30 mEq/L (ref 19–32)
Calcium: 9.6 mg/dL (ref 8.4–10.5)
Chloride: 104 mEq/L (ref 96–112)
Creatinine, Ser: 0.89 mg/dL (ref 0.40–1.20)
GFR: 67.12 mL/min (ref >60.00)
Glucose, Bld: 91 mg/dL (ref 70–99)
POTASSIUM: 4.7 meq/L (ref 3.5–5.1)
SODIUM: 141 meq/L (ref 135–145)
TOTAL PROTEIN: 6.8 g/dL (ref 6.0–8.3)
Total Bilirubin: 0.5 mg/dL (ref 0.2–1.2)

## 2014-11-25 LAB — CBC WITH DIFFERENTIAL/PLATELET
Basophils Absolute: 0 10*3/uL (ref 0.0–0.1)
Basophils Relative: 0.7 % (ref 0.0–3.0)
EOS ABS: 0.1 10*3/uL (ref 0.0–0.7)
EOS PCT: 1.9 % (ref 0.0–5.0)
HCT: 41.2 % (ref 36.0–46.0)
HEMOGLOBIN: 14 g/dL (ref 12.0–15.0)
Lymphocytes Relative: 44.7 % (ref 12.0–46.0)
Lymphs Abs: 3.3 10*3/uL (ref 0.7–4.0)
MCHC: 34 g/dL (ref 30.0–36.0)
MCV: 93.3 fl (ref 78.0–100.0)
MONO ABS: 0.4 10*3/uL (ref 0.1–1.0)
Monocytes Relative: 5.4 % (ref 3.0–12.0)
Neutro Abs: 3.5 10*3/uL (ref 1.4–7.7)
Neutrophils Relative %: 47.3 % (ref 43.0–77.0)
Platelets: 224 10*3/uL (ref 150.0–400.0)
RBC: 4.42 Mil/uL (ref 3.87–5.11)
RDW: 13.1 % (ref 11.5–15.5)
WBC: 7.4 10*3/uL (ref 4.0–10.5)

## 2014-11-25 LAB — LIPID PANEL
CHOLESTEROL: 246 mg/dL — AB (ref 0–200)
HDL: 61.9 mg/dL (ref >39.00)
LDL Cholesterol: 160 mg/dL — ABNORMAL HIGH (ref 0–99)
NonHDL: 184.36
TRIGLYCERIDES: 123 mg/dL (ref 0.0–149.0)
Total CHOL/HDL Ratio: 4
VLDL: 24.6 mg/dL (ref 0.0–40.0)

## 2014-11-25 LAB — VITAMIN D 25 HYDROXY (VIT D DEFICIENCY, FRACTURES): VITD: 27.08 ng/mL — AB (ref 30.00–100.00)

## 2014-11-25 LAB — TSH: TSH: 1.83 u[IU]/mL (ref 0.35–4.50)

## 2014-11-25 NOTE — Progress Notes (Signed)
Pre-visit discussion using our clinic review tool. No additional management support is needed unless otherwise documented below in the visit note.  

## 2014-11-25 NOTE — Patient Instructions (Signed)

## 2014-11-25 NOTE — Progress Notes (Signed)
Patient ID: Grace [name], female    DOB: Jan 26, 1946  Age: 69 y.o. MRN: [id number]  The patient is here for annual Medicare wellness examination and management of other chronic and acute problems.    Her last mammogram was normal  In March 2016  [facility]  She is s/p TAH/BSO  Last colonoscopy/colon CA screening was in 2012 and 5 yr follow up is due in 2017. Cataracts found in April 2016    FH of macular degeneration Does a lot of computer work [employer] 3 times weekly  Deferred flu shot     The risk factors are reflected in the social history.  The roster of all physicians providing medical care to patient - is listed in the Snapshot section of the chart.  Activities of daily living:  The patient is 100% independent in all ADLs: dressing, toileting, feeding as well as independent mobility  Home safety : The patient has smoke detectors in the home. They wear seatbelts.  There are no firearms at home. There is no violence in the home.   There is no risks for hepatitis, STDs or HIV. There is no   history of blood transfusion. They have no travel history to infectious disease endemic areas of the world.  The patient has seen their dentist in the last six month. They have seen their eye doctor in the last year. They admit to slight hearing difficulty with regard to whispered voices and some television programs.  They have deferred audiologic testing in the last year.  They do not  have excessive sun exposure. Discussed the need for sun protection: hats, long sleeves and use of sunscreen if there is significant sun exposure.   Diet: the importance of a healthy diet is discussed. They do have a healthy diet.  The benefits of regular aerobic exercise were discussed. She walks 4 times per week ,  20 minutes.   Depression screen: there are no signs or vegative symptoms of depression- irritability, change in appetite, anhedonia, sadness/tearfullness.  Cognitive assessment: the patient manages all their  financial and personal affairs and is actively engaged. They could relate day,date,year and events; recalled 2/3 objects at 3 minutes; performed clock-face test normally.  The following portions of the patient's history were reviewed and updated as appropriate: allergies, current medications, past family history, past medical history,  past surgical history, past social history  and problem list.  Visual acuity was not assessed per patient preference since she has regular follow up with her ophthalmologist. Hearing and body mass index were assessed and reviewed.   During the course of the visit the patient was educated and counseled about appropriate screening and preventive services including : fall prevention , diabetes screening, nutrition counseling, colorectal cancer screening, and recommended immunizations.    CC: The primary encounter diagnosis was Screen for STD (sexually transmitted disease). Diagnoses of Vitamin D deficiency, Hyperlipidemia, Other fatigue, Essential hypertension, Gastroesophageal reflux disease without esophagitis, Encounter for health maintenance examination, and Hallux valgus with bunions, unspecified laterality were also pertinent to this visit.  History Yassmine has a past medical history of Hyperlipidemia; Depression; allergic rhinitis; GERD (gastroesophageal reflux disease); Hypertension; Benign heart murmur; and colonic polyps.   She has past surgical history that includes Abdominal hysterectomy and Total abdominal hysterectomy w/ bilateral salpingoophorectomy (1991).   Her family history includes Cancer (age of onset: 65) in her father; Heart disease in her father; Mental illness (age of onset: 54) in her mother.She reports that she has never smoked. She  has never used smokeless tobacco. She reports that she does not drink alcohol or use illicit drugs.  Outpatient Prescriptions Prior to Visit  Medication Sig Dispense Refill  . cetirizine (ZYRTEC) 5 MG tablet Take  5 mg by mouth daily.    Marland Kitchen FLUoxetine (PROZAC) 40 MG capsule Take 1 capsule (40 mg total) by mouth daily. 90 capsule 1  . Multiple Vitamin (MULTIVITAMIN) tablet Take 1 tablet by mouth daily.    . Probiotic Product (PROBIOTIC FORMULA PO) Take by mouth.    . valsartan-hydrochlorothiazide (DIOVAN-HCT) 80-12.5 MG per tablet Take 1 tablet by mouth  daily 90 tablet 1  . QUERCETIN PO Take by mouth.    . ranitidine (ZANTAC) 300 MG capsule Take 1 capsule (300 mg total) by mouth every evening. (Patient not taking: Reported on 11/25/2014) 30 capsule 2   No facility-administered medications prior to visit.    Review of Systems   Patient denies headache, fevers, malaise, unintentional weight loss, skin rash, eye pain, sinus congestion and sinus pain, sore throat, dysphagia,  hemoptysis , cough, dyspnea, wheezing, chest pain, palpitations, orthopnea, edema, abdominal pain, nausea, melena, diarrhea, constipation, flank pain, dysuria, hematuria, urinary  Frequency, nocturia, numbness, tingling, seizures,  Focal weakness, Loss of consciousness,  Tremor, insomnia, depression, anxiety, and suicidal ideation.      Objective:  BP 126/78 mmHg  Pulse 63  Temp(Src) 98.2 F (36.8 C) (Oral)  Resp 14  Ht 5' 5.75" (1.67 m)  Wt 167 lb 6 oz (75.921 kg)  BMI 27.22 kg/m2  SpO2 98%  Physical Exam   General appearance: alert, cooperative and appears stated age Head: Normocephalic, without obvious abnormality, atraumatic Eyes: conjunctivae/corneas clear. PERRL, EOM's intact. Fundi benign. Ears: normal TM's and external ear canals both ears Nose: Nares normal. Septum midline. Mucosa normal. No drainage or sinus tenderness. Throat: lips, mucosa, and tongue normal; teeth and gums normal Neck: no adenopathy, no carotid bruit, no JVD, supple, symmetrical, trachea midline and thyroid not enlarged, symmetric, no tenderness/mass/nodules Lungs: clear to auscultation bilaterally Breasts: normal appearance, no masses or  tenderness Heart: regular rate and rhythm, S1, S2 normal, no murmur, click, rub or gallop Abdomen: soft, non-tender; bowel sounds normal; no masses,  no organomegaly Extremities: extremities normal, atraumatic, no cyanosis or edema Pulses: 2+ and symmetric Skin: Skin color, texture, turgor normal. No rashes or lesions Neurologic: Alert and oriented X 3, normal strength and tone. Normal symmetric reflexes. Normal coordination and gait.     Assessment & Plan:   Problem List Items Addressed This Visit      Unprioritized   GERD (gastroesophageal reflux disease)    Discussed current controversy regarding prolonged use of PPI in patients without documented Barretts esophagus.   Has avoided daily PPI for GERD ,  Using licorice instead prn         Hypertension    Discussed changing  BP medication to one without hctz given increase in Cr over last year if persistent,  But Cr has improved.  No changes today   Lab Results  Component Value Date   CREATININE 0.89 11/25/2014   Lab Results  Component Value Date   NA 141 11/25/2014   K 4.7 11/25/2014   CL 104 11/25/2014   CO2 30 11/25/2014          Hyperlipidemia    Thus far managed with diet and krill oil.\.Based on current lipid profile, the risk of clinically significant CAD is 13% over the next 10 years, using the Framingham risk calculator. The  [employer] recommends starting patients aged 79 or higher on moderate intensity statin therapy for LDL between 70-189 and 10 yr risk of CAD > 7.5% . She has deferred statin therapy  Lab Results  Component Value Date   CHOL 246* 11/25/2014   HDL 61.90 11/25/2014   LDLCALC 160* 11/25/2014   LDLDIRECT 159.2 12/17/2012   TRIG 123.0 11/25/2014   CHOLHDL 4 11/25/2014            Relevant Orders   Lipid panel   Hallux valgus with bunions    S/p corrective surgery in 2014 at [facility].      Encounter for health maintenance examination    Annual wellness  exam was  done as well as a comprehensive physical exam and management of acute and chronic conditions .  During the course of the visit the patient was educated and counseled about appropriate screening and preventive services including : fall prevention , diabetes screening, nutrition counseling, colorectal cancer screening, and recommended immunizations.  Printed recommendations for health maintenance screenings was given.        Other Visit Diagnoses    Screen for STD (sexually transmitted disease)    -  Primary    Relevant Orders    HIV antibody (Completed)    Hepatitis C antibody (Completed)    Vitamin D deficiency        Relevant Orders    Vit D  25 hydroxy (rtn osteoporosis monitoring) (Completed)    Other fatigue        Relevant Orders    CBC with Differential/Platelet (Completed)    Comprehensive metabolic panel (Completed)    TSH       I am having Ms. Vernet maintain her cetirizine, multivitamin, QUERCETIN PO, Probiotic Product (PROBIOTIC FORMULA PO), FLUoxetine, ranitidine, and valsartan-hydrochlorothiazide.  No orders of the defined types were placed in this encounter.    There are no discontinued medications.  Follow-up: No Follow-up on file.   Crecencio Mc, MD

## 2014-11-26 LAB — HEPATITIS C ANTIBODY: HCV AB: NEGATIVE

## 2014-11-26 LAB — HIV ANTIBODY (ROUTINE TESTING W REFLEX): HIV 1&2 Ab, 4th Generation: NONREACTIVE

## 2014-11-26 NOTE — Assessment & Plan Note (Signed)
Thus far managed with diet and krill oil.\.Based on current lipid profile, the risk of clinically significant CAD is 13% over the next 10 years, using the Framingham risk calculator. The [employer] recommends starting patients aged 67 or higher on moderate intensity statin therapy for LDL between 70-189 and 10 yr risk of CAD > 7.5% . She has deferred statin therapy  Lab Results  Component Value Date   CHOL 246* 11/25/2014   HDL 61.90 11/25/2014   LDLCALC 160* 11/25/2014   LDLDIRECT 159.2 12/17/2012   TRIG 123.0 11/25/2014   CHOLHDL 4 11/25/2014

## 2014-11-26 NOTE — Assessment & Plan Note (Signed)
Annual  wellness  exam was done as well as a comprehensive physical exam and management of acute and chronic conditions .  During the course of the visit the patient was educated and counseled about appropriate screening and preventive services including : fall prevention , diabetes screening, nutrition counseling, colorectal cancer screening, and recommended immunizations.  Printed recommendations for health maintenance screenings was given.  

## 2014-11-26 NOTE — Assessment & Plan Note (Signed)
S/p corrective surgery in 2014 at [facility].

## 2014-11-26 NOTE — Assessment & Plan Note (Signed)
Discussed current controversy regarding prolonged use of PPI in patients without documented Barretts esophagus.   Has avoided daily PPI for GERD ,  Using licorice instead prn

## 2014-11-26 NOTE — Addendum Note (Signed)
Addended by: Crecencio Mc on: 11/26/2014 04:37 PM   Modules accepted: Miquel Dunn

## 2014-11-26 NOTE — Assessment & Plan Note (Addendum)
Discussed changing  BP medication to one without hctz given increase in Cr over last year if persistent,  But Cr has improved.  No changes today   Lab Results  Component Value Date   CREATININE 0.89 11/25/2014   Lab Results  Component Value Date   NA 141 11/25/2014   K 4.7 11/25/2014   CL 104 11/25/2014   CO2 30 11/25/2014

## 2014-12-01 ENCOUNTER — Encounter: Payer: Self-pay | Admitting: Internal Medicine

## 2014-12-24 ENCOUNTER — Other Ambulatory Visit: Payer: Self-pay | Admitting: Internal Medicine

## 2015-05-05 ENCOUNTER — Encounter: Payer: Self-pay | Admitting: Internal Medicine

## 2015-05-05 DIAGNOSIS — Z86010 Personal history of colonic polyps: Secondary | ICD-10-CM

## 2015-05-07 NOTE — Telephone Encounter (Signed)
Patient is due for Colonoscopy per note received from Dr. Collene Mares every five years.

## 2015-05-11 ENCOUNTER — Other Ambulatory Visit: Payer: Self-pay | Admitting: Internal Medicine

## 2015-05-12 NOTE — Telephone Encounter (Signed)
Last OV 9/16 ok to fill Prozac?

## 2015-05-13 NOTE — Telephone Encounter (Signed)
Refilled.  Needs appt in march

## 2015-06-06 ENCOUNTER — Other Ambulatory Visit: Payer: Self-pay | Admitting: Internal Medicine

## 2015-06-23 ENCOUNTER — Encounter: Payer: Self-pay | Admitting: Internal Medicine

## 2015-06-23 LAB — HM COLONOSCOPY

## 2015-08-01 ENCOUNTER — Other Ambulatory Visit: Payer: Self-pay | Admitting: Internal Medicine

## 2015-08-02 MED ORDER — VALSARTAN-HYDROCHLOROTHIAZIDE 80-12.5 MG PO TABS: 1.0000 | ORAL_TABLET | Freq: Every day | ORAL | Status: DC

## 2015-08-02 MED ORDER — FLUOXETINE HCL 40 MG PO CAPS: 40.0000 mg | ORAL_CAPSULE | Freq: Every day | ORAL | Status: DC

## 2015-10-12 ENCOUNTER — Other Ambulatory Visit: Payer: Self-pay | Admitting: Internal Medicine

## 2015-10-12 ENCOUNTER — Encounter: Payer: Self-pay | Admitting: *Deleted

## 2015-10-12 MED ORDER — VALSARTAN-HYDROCHLOROTHIAZIDE 80-12.5 MG PO TABS: 1.0000 | ORAL_TABLET | Freq: Every day | ORAL | 1 refills | Status: DC

## 2015-10-13 ENCOUNTER — Telehealth: Payer: Self-pay | Admitting: *Deleted

## 2015-10-13 ENCOUNTER — Encounter: Payer: Self-pay | Admitting: Internal Medicine

## 2015-10-13 MED ORDER — FLUOXETINE HCL 40 MG PO CAPS: 40.0000 mg | ORAL_CAPSULE | Freq: Every day | ORAL | 1 refills | Status: DC

## 2015-10-13 NOTE — Telephone Encounter (Signed)
Patient has a Sept 11 appt, she has requested to have her prozac filled up until her appt date.

## 2015-10-13 NOTE — Telephone Encounter (Signed)
This is pended to PCP already in interface, thanks

## 2015-11-08 ENCOUNTER — Encounter: Payer: Self-pay | Admitting: Internal Medicine

## 2015-11-22 ENCOUNTER — Ambulatory Visit: Payer: Self-pay | Admitting: Internal Medicine

## 2015-12-16 ENCOUNTER — Ambulatory Visit (INDEPENDENT_AMBULATORY_CARE_PROVIDER_SITE_OTHER): Payer: Managed Care, Other (non HMO) | Admitting: Internal Medicine

## 2015-12-16 ENCOUNTER — Encounter: Payer: Self-pay | Admitting: Internal Medicine

## 2015-12-16 VITALS — BP 140/90 | HR 63 | Temp 98.2°F | Resp 12 | Ht 66.0 in | Wt 167.0 lb

## 2015-12-16 DIAGNOSIS — I10 Essential (primary) hypertension: Secondary | ICD-10-CM

## 2015-12-16 DIAGNOSIS — Z23 Encounter for immunization: Secondary | ICD-10-CM

## 2015-12-16 DIAGNOSIS — E782 Mixed hyperlipidemia: Secondary | ICD-10-CM

## 2015-12-16 DIAGNOSIS — R0789 Other chest pain: Secondary | ICD-10-CM

## 2015-12-16 DIAGNOSIS — F3289 Other specified depressive episodes: Secondary | ICD-10-CM

## 2015-12-16 DIAGNOSIS — K2100 Gastro-esophageal reflux disease with esophagitis, without bleeding: Secondary | ICD-10-CM

## 2015-12-16 DIAGNOSIS — E559 Vitamin D deficiency, unspecified: Secondary | ICD-10-CM | POA: Diagnosis not present

## 2015-12-16 NOTE — Patient Instructions (Addendum)
Try taking  Beano  With any meals containing  vegetables and legumes  Continue  Omeprazole daily ;  Take it in the am when fasting     Resume BP medications and check BP after one week of daly therapy.  call if BP is > 130/80   Hiatal Hernia A hiatal hernia occurs when part of your stomach slides above the muscle that separates your abdomen from your chest (diaphragm). You can be born with a hiatal hernia (congenital), or it may develop over time. In almost all cases of hiatal hernia, only the top part of the stomach pushes through.  Many people have a hiatal hernia with no symptoms. The larger the hernia, the more likely that you will have symptoms. In some cases, a hiatal hernia allows stomach acid to flow back into the tube that carries food from your mouth to your stomach (esophagus). This may cause heartburn symptoms. Severe heartburn symptoms may mean you have developed a condition called gastroesophageal reflux disease (GERD).  CAUSES  Hiatal hernias are caused by a weakness in the opening (hiatus) where your esophagus passes through your diaphragm to attach to the upper part of your stomach. You may be born with a weakness in your hiatus, or a weakness can develop. RISK FACTORS Older age is a major risk factor for a hiatal hernia. Anything that increases pressure on your diaphragm can also increase your risk of a hiatal hernia. This includes:  Pregnancy.  Excess weight.  Frequent constipation. SIGNS AND SYMPTOMS  People with a hiatal hernia often have no symptoms. If symptoms develop, they are almost always caused by GERD. They may include:  Heartburn.  Belching.  Indigestion.  Trouble swallowing.  Coughing or wheezing.  Sore throat.  Hoarseness.  Chest pain. DIAGNOSIS  A hiatal hernia is sometimes found during an exam for another problem. Your health care provider may suspect a hiatal hernia if you have symptoms of GERD. Tests may be done to diagnose GERD. These may  include:  X-rays of your stomach or chest.  An upper gastrointestinal (GI) series. This is an X-ray exam of your GI tract involving the use of a chalky liquid that you swallow. The liquid shows up clearly on the X-ray.  Endoscopy. This is a procedure to look into your stomach using a thin, flexible tube that has a tiny camera and light on the end of it. TREATMENT  If you have no symptoms, you may not need treatment. If you have symptoms, treatment may include:  Dietary and lifestyle changes to help reduce GERD symptoms.  Medicines. These may include:  Over-the-counter antacids.  Medicines that make your stomach empty more quickly.  Medicines that block the production of stomach acid (H2 blockers).  Stronger medicines to reduce stomach acid (proton pump inhibitors).  You may need surgery to repair the hernia if other treatments are not helping. HOME CARE INSTRUCTIONS   Take all medicines as directed by your health care provider.  Quit smoking, if you smoke.  Try to achieve and maintain a healthy body weight.  Eat frequent small meals instead of three large meals a day. This keeps your stomach from getting too full.  Eat slowly.  Do not lie down right after eating.  Do noteat 1-2 hours before bed.   Do not drink beverages with caffeine. These include cola, coffee, cocoa, and tea.  Do not drink alcohol.  Avoid foods that can make symptoms of GERD worse. These may include:  Fatty foods.  Citrus fruits.  Other foods and drinks that contain acid.  Avoid putting pressure on your belly. Anything that puts pressure on your belly increases the amount of acid that may be pushed up into your esophagus.   Avoid bending over, especially after eating.  Raise the head of your bed by putting blocks under the legs. This keeps your head and esophagus higher than your stomach.  Do not wear tight clothing around your chest or stomach.  Try not to strain when having a bowel  movement, when urinating, or when lifting heavy objects. SEEK MEDICAL CARE IF:  Your symptoms are not controlled with medicines or lifestyle changes.  You are having trouble swallowing.  You have coughing or wheezing that will not go away. SEEK IMMEDIATE MEDICAL CARE IF:  Your pain is getting worse.  Your pain spreads to your arms, neck, jaw, teeth, or back.  You have shortness of breath.  You sweat for no reason.  You feel sick to your stomach (nauseous) or vomit.  You vomit blood.  You have bright red blood in your stools.  You have black, tarry stools.    This information is not intended to replace advice given to you by your health care provider. Make sure you discuss any questions you have with your health care provider.   Document Released: 05/20/2003 Document Revised: 03/20/2014 Document Reviewed: 02/14/2013 Elsevier Interactive Patient Education [employer].

## 2015-12-16 NOTE — Progress Notes (Signed)
Subjective:  Patient ID: Grace Grace [name], female    DOB: 12/21/1945  Age: 70 y.o. MRN: [id number]  CC: The primary encounter diagnosis was Mixed hyperlipidemia. Diagnoses of Vitamin D deficiency, Essential hypertension, Need for prophylactic vaccination against Streptococcus pneumoniae (pneumococcus), Gastroesophageal reflux disease with esophagitis, Other depression, and Chest pain, atypical were also pertinent to this visit.  HPI Grace Grace [name] presents for follow up on hypertension, hyperlipideiam .  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  Using otc NSAIDs prn   Has been having a lot o f"gas" pressing on her esophagus . Not feeling acid  Since starting omeprazole in [name] per Dr Collene Mares.   Has a hiatal hernia . Using rolaids with simethicone as needed.   The pain is relieved by burping.  Occurs with position change,  And worse at night,  Not with exertion .  Remote history of stress test ,  exercise induced asthma. Doesn't get short of breath unless she tries to run or jog .   Ran out of Diovan hct last month due to being lost to follow up.   FH of CAD in father,  PGM   Has declined the flu vaccine    Outpatient Medications Prior to Visit  Medication Sig Dispense Refill  . cetirizine (ZYRTEC) 5 MG tablet Take 5 mg by mouth daily.    Marland Kitchen FLUoxetine (PROZAC) 40 MG capsule Take 1 capsule (40 mg total) by mouth daily. 30 capsule 1  . Multiple Vitamin (MULTIVITAMIN) tablet Take 1 tablet by mouth daily.    Marland Kitchen QUERCETIN PO Take by mouth.    . valsartan-hydrochlorothiazide (DIOVAN-HCT) 80-12.5 MG tablet Take 1 tablet by mouth daily. 30 tablet 1  . Probiotic Product (PROBIOTIC FORMULA PO) Take by mouth.    . ranitidine (ZANTAC) 300 MG capsule Take 1 capsule (300 mg total) by mouth every evening. (Patient not taking: Reported on 12/16/2015) 30 capsule 2   No facility-administered medications prior to visit.     Review of Systems;  Patient denies  headache, fevers, malaise, unintentional weight loss, skin rash, eye pain, sinus congestion and sinus pain, sore throat, dysphagia,  hemoptysis , cough, dyspnea, wheezing, chest pain, palpitations, orthopnea, edema, abdominal pain, nausea, melena, diarrhea, constipation, flank pain, dysuria, hematuria, urinary  Frequency, nocturia, numbness, tingling, seizures,  Focal weakness, Loss of consciousness,  Tremor, insomnia, depression, anxiety, and suicidal ideation.      Objective:  BP 140/90   Pulse 63   Temp 98.2 F (36.8 C) (Oral)   Resp 12   Ht 5\' 6"  (1.676 m)   Wt 167 lb (75.8 kg)   SpO2 99%   BMI 26.95 kg/m   BP Readings from Last 3 Encounters:  12/16/15 140/90  11/25/14 126/78  10/12/14 (!) 162/105    Wt Readings from Last 3 Encounters:  12/16/15 167 lb (75.8 kg)  11/25/14 167 lb 6 oz (75.9 kg)  10/12/14 168 lb 3.2 oz (76.3 kg)    General appearance: alert, cooperative and appears stated age Ears: normal TM's and external ear canals both ears Throat: lips, mucosa, and tongue normal; teeth and gums normal Neck: no adenopathy, no carotid bruit, supple, symmetrical, trachea midline and thyroid not enlarged, symmetric, no tenderness/mass/nodules Back: symmetric, no curvature. ROM normal. No CVA tenderness. Lungs: clear to auscultation bilaterally Heart: regular rate and rhythm, S1, S2 normal, no murmur, click, rub or gallop Abdomen: soft, non-tender; bowel sounds normal; no masses,  no organomegaly Pulses: 2+ and symmetric Skin: Skin color, texture, turgor normal. No rashes or lesions Lymph nodes: Cervical, supraclavicular, and axillary nodes normal.  No results found for: HGBA1C  Lab Results  Component Value Date   CREATININE 0.88 12/16/2015   CREATININE 0.89 11/25/2014   CREATININE 1.00 07/29/2014    Lab Results  Component Value Date   WBC 7.4 11/25/2014   HGB 14.0 11/25/2014   HCT 41.2 11/25/2014   PLT 224.0 11/25/2014   GLUCOSE 92 12/16/2015   CHOL 207 (H)  12/16/2015   TRIG 121 12/16/2015   HDL 64 12/16/2015   LDLDIRECT 130 (H) 12/16/2015   LDLCALC 119 (H) 12/16/2015   ALT 31 12/16/2015   AST 26 12/16/2015   NA 144 12/16/2015   K 4.4 12/16/2015   CL 103 12/16/2015   CREATININE 0.88 12/16/2015   BUN 13 12/16/2015   CO2 25 12/16/2015   TSH 1.83 11/25/2014    US Venous Img Lower Unilateral Right  Result Date: 10/12/2014 CLINICAL DATA:  Acute onset of right lower extremity swelling. Initial encounter. EXAM: RIGHT LOWER EXTREMITY VENOUS DOPPLER ULTRASOUND TECHNIQUE: Gray-scale sonography with graded compression, as well as color Doppler and duplex ultrasound were performed to evaluate the lower extremity deep venous systems from the level of the common femoral vein and including the common femoral, femoral, profunda femoral, popliteal and calf veins including the posterior tibial, peroneal and gastrocnemius veins when visible. The superficial great saphenous vein was also interrogated. Spectral Doppler was utilized to evaluate flow at rest and with distal augmentation maneuvers in the common femoral, femoral and popliteal veins. COMPARISON:  None. FINDINGS: Contralateral Common Femoral Vein: Respiratory phasicity is normal and symmetric with the symptomatic side. No evidence of thrombus. Normal compressibility. Common Femoral Vein: No evidence of thrombus. Normal compressibility, respiratory phasicity and response to augmentation. Saphenofemoral Junction: No evidence of thrombus. Normal compressibility and flow on color Doppler imaging. Profunda Femoral Vein: No evidence of thrombus. Normal compressibility and flow on color Doppler imaging. Femoral Vein: No evidence of thrombus. Normal compressibility, respiratory phasicity and response to augmentation. Popliteal Vein: No evidence of thrombus. Normal compressibility, respiratory phasicity and response to augmentation. Calf Veins: No evidence of thrombus. Normal compressibility and flow on color Doppler  imaging. Superficial Great Saphenous Vein: No evidence of thrombus. Normal compressibility and flow on color Doppler imaging. Venous Reflux:  None. Other Findings:  None. IMPRESSION: No evidence of deep venous thrombosis. Electronically Signed   By: Garald Balding M.D.   On: 10/12/2014 18:04    Assessment & Plan:   Problem List Items Addressed This Visit    Depression    Recurrent, managed with prozac.  Patient prefers to remain on current medication given recurrence of symptoms with prior  weaning trials. Refills given         GERD (gastroesophageal reflux disease)    With episodes of SSCP attributed to gas, relieved with burping and use of simethicone.  Has a hiatal hernia as well,  Taking omeprazole daily, but not when fasting.  Recommended change in dosing to am,.  Using Beano before meals likely to cause symptoms.        Relevant Medications   omeprazole (PRILOSEC) 40 MG capsule   esomeprazole (NEXIUM) 20 MG capsule   Hypertension    Elevated due to lapse in medication.  Resume medication.       Relevant Orders   Comprehensive metabolic panel (Completed)   CRP High sensitivity (Completed)   Hyperlipidemia - Primary  Statin intolerant, imporoved with quercetin  Lab Results  Component Value Date   CHOL 207 (H) 12/16/2015   HDL 64 12/16/2015   LDLCALC 119 (H) 12/16/2015   LDLDIRECT 130 (H) 12/16/2015   TRIG 121 12/16/2015   CHOLHDL 3.2 12/16/2015         Relevant Orders   LDL cholesterol, direct (Completed)   Lipid panel (Completed)   Chest pain, atypical    Not exertional,  Associated wi  hbelching and hiatal hernia.         Other Visit Diagnoses    Vitamin D deficiency       Relevant Orders   VITAMIN D 25 Hydroxy (Vit-D Deficiency, Fractures) (Completed)   Need for prophylactic vaccination against Streptococcus pneumoniae (pneumococcus)       Relevant Orders   Pneumococcal polysaccharide vaccine 23-valent greater than or equal to 2yo subcutaneous/IM  (Completed)     A total of 25 minutes of face to face time was spent with patient more than half of which was spent in counselling about the above mentioned conditions  and coordination of care   I am having Ms. Whidbee maintain her cetirizine, multivitamin, QUERCETIN PO, Probiotic Product (PROBIOTIC FORMULA PO), ranitidine, valsartan-hydrochlorothiazide, FLUoxetine, omeprazole, and esomeprazole.  Meds ordered this encounter  Medications  . omeprazole (PRILOSEC) 40 MG capsule    Sig: Take 40 mg by mouth daily.  Marland Kitchen esomeprazole (NEXIUM) 20 MG capsule    Sig: Take 20 mg by mouth daily at 12 noon.    There are no discontinued medications.  Follow-up: Return in about 3 months (around 03/17/2016) for annual CPE .   Crecencio Mc, MD

## 2015-12-16 NOTE — Progress Notes (Signed)
Pre-visit discussion using our clinic review tool. No additional management support is needed unless otherwise documented below in the visit note.  

## 2015-12-17 LAB — COMPREHENSIVE METABOLIC PANEL
A/G RATIO: 2.2 (ref 1.2–2.2)
ALK PHOS: 47 IU/L (ref 39–117)
ALT: 31 IU/L (ref 0–32)
AST: 26 IU/L (ref 0–40)
Albumin: 4.3 g/dL (ref 3.6–4.8)
BUN / CREAT RATIO: 15 (ref 12–28)
BUN: 13 mg/dL (ref 8–27)
Bilirubin Total: 0.5 mg/dL (ref 0.0–1.2)
CO2: 25 mmol/L (ref 18–29)
CREATININE: 0.88 mg/dL (ref 0.57–1.00)
Calcium: 9.4 mg/dL (ref 8.7–10.3)
Chloride: 103 mmol/L (ref 96–106)
GFR, EST AFRICAN AMERICAN: 78 mL/min/{1.73_m2} (ref >59)
GFR, EST NON AFRICAN AMERICAN: 68 mL/min/{1.73_m2} (ref >59)
GLOBULIN, TOTAL: 2 g/dL (ref 1.5–4.5)
Glucose: 92 mg/dL (ref 65–99)
Potassium: 4.4 mmol/L (ref 3.5–5.2)
SODIUM: 144 mmol/L (ref 134–144)
Total Protein: 6.3 g/dL (ref 6.0–8.5)

## 2015-12-17 LAB — LIPID PANEL
CHOL/HDL RATIO: 3.2 ratio (ref 0.0–4.4)
Cholesterol, Total: 207 mg/dL — ABNORMAL HIGH (ref 100–199)
HDL: 64 mg/dL (ref >39)
LDL CALC: 119 mg/dL — AB (ref 0–99)
TRIGLYCERIDES: 121 mg/dL (ref 0–149)
VLDL Cholesterol Cal: 24 mg/dL (ref 5–40)

## 2015-12-17 LAB — LDL CHOLESTEROL, DIRECT: LDL Direct: 130 mg/dL — ABNORMAL HIGH (ref 0–99)

## 2015-12-17 LAB — VITAMIN D 25 HYDROXY (VIT D DEFICIENCY, FRACTURES): VIT D 25 HYDROXY: 39.6 ng/mL (ref 30.0–100.0)

## 2015-12-17 LAB — HIGH SENSITIVITY CRP: CRP, High Sensitivity: 0.54 mg/L (ref 0.00–3.00)

## 2015-12-18 ENCOUNTER — Other Ambulatory Visit: Payer: Self-pay | Admitting: Internal Medicine

## 2015-12-18 ENCOUNTER — Encounter: Payer: Self-pay | Admitting: Internal Medicine

## 2015-12-18 NOTE — Assessment & Plan Note (Signed)
Recurrent, managed with prozac.  Patient prefers to remain on current medication given recurrence of symptoms with prior  weaning trials. Refills given

## 2015-12-18 NOTE — Assessment & Plan Note (Signed)
Not exertional,  Associated wi  hbelching and hiatal hernia.

## 2015-12-18 NOTE — Assessment & Plan Note (Signed)
Elevated due to lapse in medication.  Resume medication.

## 2015-12-18 NOTE — Assessment & Plan Note (Signed)
Statin intolerant, imporoved with quercetin  Lab Results  Component Value Date   CHOL 207 (H) 12/16/2015   HDL 64 12/16/2015   LDLCALC 119 (H) 12/16/2015   LDLDIRECT 130 (H) 12/16/2015   TRIG 121 12/16/2015   CHOLHDL 3.2 12/16/2015

## 2015-12-18 NOTE — Assessment & Plan Note (Addendum)
With episodes of SSCP attributed to gas, relieved with burping and use of simethicone.  Has a hiatal hernia as well,  Taking omeprazole daily, but not when fasting.  Recommended change in dosing to am,.  Using Beano before meals likely to cause symptoms.

## 2016-02-12 ENCOUNTER — Other Ambulatory Visit: Payer: Self-pay | Admitting: Internal Medicine

## 2016-03-20 ENCOUNTER — Ambulatory Visit (INDEPENDENT_AMBULATORY_CARE_PROVIDER_SITE_OTHER): Payer: Managed Care, Other (non HMO) | Admitting: Internal Medicine

## 2016-03-20 ENCOUNTER — Encounter: Payer: Self-pay | Admitting: Internal Medicine

## 2016-03-20 VITALS — BP 122/78 | HR 74 | Temp 98.0°F | Resp 16 | Ht 64.5 in | Wt 163.2 lb

## 2016-03-20 DIAGNOSIS — I10 Essential (primary) hypertension: Secondary | ICD-10-CM

## 2016-03-20 DIAGNOSIS — K2100 Gastro-esophageal reflux disease with esophagitis, without bleeding: Secondary | ICD-10-CM

## 2016-03-20 DIAGNOSIS — E7800 Pure hypercholesterolemia, unspecified: Secondary | ICD-10-CM

## 2016-03-20 DIAGNOSIS — Z1231 Encounter for screening mammogram for malignant neoplasm of breast: Secondary | ICD-10-CM | POA: Diagnosis not present

## 2016-03-20 DIAGNOSIS — Z0000 Encounter for general adult medical examination without abnormal findings: Secondary | ICD-10-CM

## 2016-03-20 DIAGNOSIS — Z1239 Encounter for other screening for malignant neoplasm of breast: Secondary | ICD-10-CM

## 2016-03-20 DIAGNOSIS — F330 Major depressive disorder, recurrent, mild: Secondary | ICD-10-CM | POA: Diagnosis not present

## 2016-03-20 DIAGNOSIS — H9193 Unspecified hearing loss, bilateral: Secondary | ICD-10-CM | POA: Diagnosis not present

## 2016-03-20 DIAGNOSIS — Z86010 Personal history of colonic polyps: Secondary | ICD-10-CM

## 2016-03-20 NOTE — Patient Instructions (Addendum)
For your reflux: Try taking the zantac around 4 or 5 pm   Try eating your lightest meal at night,  when you are less active and more likely to have reflux  Return in April for non fasting labs only .  (I'll see you in 6 months)     Food Choices for Gastroesophageal Reflux Disease, Adult When you have gastroesophageal reflux disease (GERD), the foods you eat and your eating habits are very important. Choosing the right foods can help ease your discomfort. What guidelines do I need to follow?  Choose fruits, vegetables, whole grains, and low-fat dairy products.  Choose low-fat meat, fish, and poultry.  Limit fats such as oils, salad dressings, butter, nuts, and avocado.  Keep a food diary. This helps you identify foods that cause symptoms.  Avoid foods that cause symptoms. These may be different for everyone.  Eat small meals often instead of 3 large meals a day.  Eat your meals slowly, in a place where you are relaxed.  Limit fried foods.  Cook foods using methods other than frying.  Avoid drinking alcohol.  Avoid drinking large amounts of liquids with your meals.  Avoid bending over or lying down until 2-3 hours after eating. What foods are not recommended? These are some foods and drinks that may make your symptoms worse: Vegetables  Tomatoes. Tomato juice. Tomato and spaghetti sauce. Chili peppers. Onion and garlic. Horseradish. Fruits  Oranges, grapefruit, and lemon (fruit and juice). Meats  High-fat meats, fish, and poultry. This includes hot dogs, ribs, ham, sausage, salami, and bacon. Dairy  Whole milk and chocolate milk. Sour cream. Cream. Butter. Ice cream. Cream cheese. Drinks  Coffee and tea. Bubbly (carbonated) drinks or energy drinks. Condiments  Hot sauce. Barbecue sauce. Sweets/Desserts  Chocolate and cocoa. Donuts. Peppermint and spearmint. Fats and Oils  High-fat foods. This includes [place] fries and potato chips. Other  Vinegar. Strong spices.  This includes black pepper, white pepper, red pepper, cayenne, curry powder, cloves, ginger, and chili powder. The items listed above may not be a complete list of foods and drinks to avoid. Contact your dietitian for more information.  This information is not intended to replace advice given to you by your health care provider. Make sure you discuss any questions you have with your health care provider. Document Released: 08/29/2011 Document Revised: 08/05/2015 Document Reviewed: 01/01/2013 Elsevier Interactive Patient Education  2017 [place] Maintenance for Postmenopausal Women Introduction Menopause is a normal process in which your reproductive ability comes to an end. This process happens gradually over a span of months to years, usually between the ages of 39 and 69. Menopause is complete when you have missed 12 consecutive menstrual periods. It is important to talk with your health care provider about some of the most common conditions that affect postmenopausal women, such as heart disease, cancer, and bone loss (osteoporosis). Adopting a healthy lifestyle and getting preventive care can help to promote your health and wellness. Those actions can also lower your chances of developing some of these common conditions. What should I know about menopause? During menopause, you may experience a number of symptoms, such as:  Moderate-to-severe hot flashes.  Night sweats.  Decrease in sex drive.  Mood swings.  Headaches.  Tiredness.  Irritability.  Memory problems.  Insomnia. Choosing to treat or not to treat menopausal changes is an individual decision that you make with your health care provider. What should I know about hormone replacement therapy and supplements?  Hormone therapy products are effective for treating symptoms that are associated with menopause, such as hot flashes and night sweats. Hormone replacement carries certain risks, especially as you become  older. If you are thinking about using estrogen or estrogen with progestin treatments, discuss the benefits and risks with your health care provider. What should I know about heart disease and stroke? Heart disease, heart attack, and stroke become more likely as you age. This may be due, in part, to the hormonal changes that your body experiences during menopause. These can affect how your body processes dietary fats, triglycerides, and cholesterol. Heart attack and stroke are both medical emergencies. There are many things that you can do to help prevent heart disease and stroke:  Have your blood pressure checked at least every 1-2 years. High blood pressure causes heart disease and increases the risk of stroke.  If you are 20-72 years old, ask your health care provider if you should take aspirin to prevent a heart attack or a stroke.  Do not use any tobacco products, including cigarettes, chewing tobacco, or electronic cigarettes. If you need help quitting, ask your health care provider.  It is important to eat a healthy diet and maintain a healthy weight.  Be sure to include plenty of vegetables, fruits, low-fat dairy products, and lean protein.  Avoid eating foods that are high in solid fats, added sugars, or salt (sodium).  Get regular exercise. This is one of the most important things that you can do for your health.  Try to exercise for at least 150 minutes each week. The type of exercise that you do should increase your heart rate and make you sweat. This is known as moderate-intensity exercise.  Try to do strengthening exercises at least twice each week. Do these in addition to the moderate-intensity exercise.  Know your numbers.Ask your health care provider to check your cholesterol and your blood glucose. Continue to have your blood tested as directed by your health care provider. What should I know about cancer screening? There are several types of cancer. Take the following  steps to reduce your risk and to catch any cancer development as early as possible. Breast Cancer  Practice breast self-awareness.  This means understanding how your breasts normally appear and feel.  It also means doing regular breast self-exams. Let your health care provider know about any changes, no matter how small.  If you are 34 or older, have a clinician do a breast exam (clinical breast exam or CBE) every year. Depending on your age, family history, and medical history, it may be recommended that you also have a yearly breast X-ray (mammogram).  If you have a family history of breast cancer, talk with your health care provider about genetic screening.  If you are at high risk for breast cancer, talk with your health care provider about having an MRI and a mammogram every year.  Breast cancer (BRCA) gene test is recommended for women who have family members with BRCA-related cancers. Results of the assessment will determine the need for genetic counseling and BRCA1 and for BRCA2 testing. BRCA-related cancers include these types:  Breast. This occurs in males or females.  Ovarian.  Tubal. This may also be called fallopian tube cancer.  Cancer of the abdominal or pelvic lining (peritoneal cancer).  Prostate.  Pancreatic. Cervical, Uterine, and Ovarian Cancer  Your health care provider may recommend that you be screened regularly for cancer of the pelvic organs. These include your ovaries, uterus, and  vagina. This screening involves a pelvic exam, which includes checking for microscopic changes to the surface of your cervix (Pap test).  For women ages 21-65, health care providers may recommend a pelvic exam and a Pap test every three years. For women ages 34-65, they may recommend the Pap test and pelvic exam, combined with testing for human papilloma virus (HPV), every five years. Some types of HPV increase your risk of cervical cancer. Testing for HPV may also be done on women  of any age who have unclear Pap test results.  Other health care providers may not recommend any screening for nonpregnant women who are considered low risk for pelvic cancer and have no symptoms. Ask your health care provider if a screening pelvic exam is right for you.  If you have had past treatment for cervical cancer or a condition that could lead to cancer, you need Pap tests and screening for cancer for at least 20 years after your treatment. If Pap tests have been discontinued for you, your risk factors (such as having a new sexual partner) need to be reassessed to determine if you should start having screenings again. Some women have medical problems that increase the chance of getting cervical cancer. In these cases, your health care provider may recommend that you have screening and Pap tests more often.  If you have a family history of uterine cancer or ovarian cancer, talk with your health care provider about genetic screening.  If you have vaginal bleeding after reaching menopause, tell your health care provider.  There are currently no reliable tests available to screen for ovarian cancer. Lung Cancer  Lung cancer screening is recommended for adults 60-47 years old who are at high risk for lung cancer because of a history of smoking. A yearly low-dose CT scan of the lungs is recommended if you:  Currently smoke.  Have a history of at least 30 pack-years of smoking and you currently smoke or have quit within the past 15 years. A pack-year is smoking an average of one pack of cigarettes per day for one year. Yearly screening should:  Continue until it has been 15 years since you quit.  Stop if you develop a health problem that would prevent you from having lung cancer treatment. Colorectal Cancer  This type of cancer can be detected and can often be prevented.  Routine colorectal cancer screening usually begins at age 75 and continues through age 40.  If you have risk factors  for colon cancer, your health care provider may recommend that you be screened at an earlier age.  If you have a family history of colorectal cancer, talk with your health care provider about genetic screening.  Your health care provider may also recommend using home test kits to check for hidden blood in your stool.  A small camera at the end of a tube can be used to examine your colon directly (sigmoidoscopy or colonoscopy). This is done to check for the earliest forms of colorectal cancer.  Direct examination of the colon should be repeated every 5-10 years until age 67. However, if early forms of precancerous polyps or small growths are found or if you have a family history or genetic risk for colorectal cancer, you may need to be screened more often. Skin Cancer  Check your skin from head to toe regularly.  Monitor any moles. Be sure to tell your health care provider:  About any new moles or changes in moles, especially if there is  a change in a mole's shape or color.  If you have a mole that is larger than the size of a pencil eraser.  If any of your family members has a history of skin cancer, especially at a young age, talk with your health care provider about genetic screening.  Always use sunscreen. Apply sunscreen liberally and repeatedly throughout the day.  Whenever you are outside, protect yourself by wearing long sleeves, pants, a wide-brimmed hat, and sunglasses. What should I know about osteoporosis? Osteoporosis is a condition in which bone destruction happens more quickly than new bone creation. After menopause, you may be at an increased risk for osteoporosis. To help prevent osteoporosis or the bone fractures that can happen because of osteoporosis, the following is recommended:  If you are 84-23 years old, get at least 1,000 mg of calcium and at least 600 mg of vitamin D per day.  If you are older than age 46 but younger than age 51, get at least 1,200 mg of calcium  and at least 600 mg of vitamin D per day.  If you are older than age 22, get at least 1,200 mg of calcium and at least 800 mg of vitamin D per day. Smoking and excessive alcohol intake increase the risk of osteoporosis. Eat foods that are rich in calcium and vitamin D, and do weight-bearing exercises several times each week as directed by your health care provider. What should I know about how menopause affects my mental health? Depression may occur at any age, but it is more common as you become older. Common symptoms of depression include:  Low or sad mood.  Changes in sleep patterns.  Changes in appetite or eating patterns.  Feeling an overall lack of motivation or enjoyment of activities that you previously enjoyed.  Frequent crying spells. Talk with your health care provider if you think that you are experiencing depression. What should I know about immunizations? It is important that you get and maintain your immunizations. These include:  Tetanus, diphtheria, and pertussis (Tdap) booster vaccine.  Influenza every year before the flu season begins.  Pneumonia vaccine.  Shingles vaccine. Your health care provider may also recommend other immunizations. This information is not intended to replace advice given to you by your health care provider. Make sure you discuss any questions you have with your health care provider. Document Released: 04/21/2005 Document Revised: 09/17/2015 Document Reviewed: 12/01/2014  2017 Elsevier

## 2016-03-20 NOTE — Progress Notes (Signed)
Pre-visit discussion using our clinic review tool. No additional management support is needed unless otherwise documented below in the visit note.  

## 2016-03-20 NOTE — Progress Notes (Signed)
Patient ID: Grace [name], female    DOB: [birth year]  Age: 71 y.o. MRN: [id number]  The patient is here for annual wellness examination and management of other chronic and acute problems.    last mammo 2016  Last flu 2014 Colonoscopy 2017. EGD 2017 hiatal hernia,  Jyothi Mann need records  DEXA 2009 Pvax oct 2017   The risk factors are reflected in the social history.  The roster of all physicians providing medical care to patient - is listed in the Snapshot section of the chart.  Activities of daily living:  The patient is 100% independent in all ADLs: dressing, toileting, feeding as well as independent mobility  Home safety : The patient has smoke detectors in the home. They wear seatbelts.  There are no firearms at home. There is no violence in the home.   There is no risks for hepatitis, STDs or HIV. There is no   history of blood transfusion. They have no travel history to infectious disease endemic areas of the world.  The patient has seen their dentist in the last six month. They have seen their eye doctor in the last year. They admit to slight hearing difficulty with regard to whispered voices and some television programs.  They have deferred audiologic testing in the last year.  They do not  have excessive sun exposure. Discussed the need for sun protection: hats, long sleeves and use of sunscreen if there is significant sun exposure.   Diet: the importance of a healthy diet is discussed. They do have a healthy diet.  The benefits of regular aerobic exercise were discussed. She walks 4 times per week ,  20 minutes.   Depression screen: there are no signs or vegative symptoms of depression- irritability, change in appetite, anhedonia, sadness/tearfullness.  Cognitive assessment: the patient manages all their financial and personal affairs and is actively engaged. They could relate day,date,year and events; recalled 2/3 objects at 3 minutes; performed clock-face test  normally.  The following portions of the patient's history were reviewed and updated as appropriate: allergies, current medications, past family history, past medical history,  past surgical history, past social history  and problem list.  Visual acuity was not assessed per patient preference since she has regular follow up with her ophthalmologist. Hearing and body mass index were assessed and reviewed.   During the course of the visit the patient was educated and counseled about appropriate screening and preventive services including : fall prevention , diabetes screening, nutrition counseling, colorectal cancer screening, and recommended immunizations.    CC: The primary encounter diagnosis was Breast cancer screening. Diagnoses of Bilateral hearing loss, unspecified hearing loss type, Essential hypertension, Mild episode of recurrent major depressive disorder ([place]), Gastroesophageal reflux disease with esophagitis, Encounter for preventive health examination, Pure hypercholesterolemia, and Hx of colonic polyps were also pertinent to this visit.  Cc: indigestion , hiatal hernia.  Using a PPI in the morning,  Not using zantac.   History Grace [name] has a past medical history of allergic rhinitis; Benign heart murmur; Depression; GERD (gastroesophageal reflux disease); colonic polyps; Hyperlipidemia; and Hypertension.   She has a past surgical history that includes Abdominal hysterectomy and Total abdominal hysterectomy w/ bilateral salpingoophorectomy (1991).   Her family history includes Cancer (age of onset: 80) in her father; Heart disease in her father; Mental illness (age of onset: 63) in her mother.She reports that she has never smoked. She has never used smokeless tobacco. She reports that she does not drink alcohol  or use drugs.  Outpatient Medications Prior to Visit  Medication Sig Dispense Refill  . cetirizine (ZYRTEC) 5 MG tablet Take 5 mg by mouth daily.    Marland Kitchen esomeprazole (NEXIUM) 20  MG capsule Take 20 mg by mouth daily at 12 noon.    Marland Kitchen FLUoxetine (PROZAC) 40 MG capsule Take 1 capsule (40 mg total) by mouth daily. 30 capsule 5  . Multiple Vitamin (MULTIVITAMIN) tablet Take 1 tablet by mouth daily.    Marland Kitchen omeprazole (PRILOSEC) 40 MG capsule Take 40 mg by mouth daily.    Marland Kitchen QUERCETIN PO Take by mouth.    . valsartan-hydrochlorothiazide (DIOVAN-HCT) 80-12.5 MG tablet TAKE 1 TABLET BY MOUTH  DAILY 90 tablet 1  . Probiotic Product (PROBIOTIC FORMULA PO) Take by mouth.    . ranitidine (ZANTAC) 300 MG capsule Take 1 capsule (300 mg total) by mouth every evening. (Patient not taking: Reported on 12/16/2015) 30 capsule 2   No facility-administered medications prior to visit.     Review of Systems   Patient denies headache, fevers, malaise, unintentional weight loss, skin rash, eye pain, sinus congestion and sinus pain, sore throat, dysphagia,  hemoptysis , cough, dyspnea, wheezing, chest pain, palpitations, orthopnea, edema, abdominal pain, nausea, melena, diarrhea, constipation, flank pain, dysuria, hematuria, urinary  Frequency, nocturia, numbness, tingling, seizures,  Focal weakness, Loss of consciousness,  Tremor, insomnia, depression, anxiety, and suicidal ideation.      Objective:  BP 122/78   Pulse 74   Temp 98 F (36.7 C) (Oral)   Resp 16   Ht 5' 4.5" (1.638 m)   Wt 163 lb 4 oz (74 kg)   SpO2 98%   BMI 27.59 kg/m   Physical Exam   General appearance: alert, cooperative and appears stated age Head: Normocephalic, without obvious abnormality, atraumatic Eyes: conjunctivae/corneas clear. PERRL, EOM's intact. Fundi benign. Ears: normal TM's and external ear canals both ears Nose: Nares normal. Septum midline. Mucosa normal. No drainage or sinus tenderness. Throat: lips, mucosa, and tongue normal; teeth and gums normal Neck: no adenopathy, no carotid bruit, no JVD, supple, symmetrical, trachea midline and thyroid not enlarged, symmetric, no  tenderness/mass/nodules Lungs: clear to auscultation bilaterally Breasts: normal appearance, no masses or tenderness Heart: regular rate and rhythm, S1, S2 normal, no murmur, click, rub or gallop Abdomen: soft, non-tender; bowel sounds normal; no masses,  no organomegaly Extremities: extremities normal, atraumatic, no cyanosis or edema Pulses: 2+ and symmetric Skin: Skin color, texture, turgor normal. No rashes or lesions Neurologic: Alert and oriented X 3, normal strength and tone. Normal symmetric reflexes. Normal coordination and gait.      Assessment & Plan:   Problem List Items Addressed This Visit    Encounter for preventive health examination    Annual comprehensive preventive exam was done as well as an evaluation and management of chronic conditions .  During the course of the visit the patient was educated and counseled about appropriate screening and preventive services including :  diabetes screening, lipid analysis with projected  10 year  risk for CAD , nutrition counseling, breast, cervical and colorectal cancer screening, and recommended immunizations.  Printed recommendations for health maintenance screenings was given      GERD (gastroesophageal reflux disease)    Persistent with hiatal hernia seen on on recent EGD .  Continue  nexium and omeprazole         Hx of colonic polyps    Last colonoscopy was done in  2017      Hyperlipidemia  Statin intolerant, improved with quercetin  Lab Results  Component Value Date   CHOL 207 (H) 12/16/2015   HDL 64 12/16/2015   LDLCALC 119 (H) 12/16/2015   LDLDIRECT 130 (H) 12/16/2015   TRIG 121 12/16/2015   CHOLHDL 3.2 12/16/2015         Hypertension   Relevant Orders   Comprehensive metabolic panel   Major depressive disorder, recurrent episode ([place])    Recurrent, managed with prozac.  Patient prefers to remain on current medication given recurrence of symptoms with prior  weaning trials. Refills given           Other Visit Diagnoses    Breast cancer screening    -  Primary   Relevant Orders   MM DIGITAL SCREENING BILATERAL   Bilateral hearing loss, unspecified hearing loss type       Relevant Orders   Ambulatory referral to ENT      I have discontinued Ms. Devito's Probiotic Product (PROBIOTIC FORMULA PO) and ranitidine. I am also having her maintain her cetirizine, multivitamin, QUERCETIN PO, omeprazole, esomeprazole, FLUoxetine, and valsartan-hydrochlorothiazide.  No orders of the defined types were placed in this encounter.   Medications Discontinued During This Encounter  Medication Reason  . Probiotic Product (PROBIOTIC FORMULA PO) No longer needed (for PRN medications)  . ranitidine (ZANTAC) 300 MG capsule Change in therapy    Follow-up: No Follow-up on file.   Crecencio Mc, MD

## 2016-03-21 NOTE — Assessment & Plan Note (Signed)
Persistent with hiatal hernia seen on on recent EGD .  Continue  nexium and omeprazole

## 2016-03-21 NOTE — Assessment & Plan Note (Signed)
Recurrent, managed with prozac.  Patient prefers to remain on current medication given recurrence of symptoms with prior  weaning trials. Refills given

## 2016-03-21 NOTE — Assessment & Plan Note (Signed)
Annual comprehensive preventive exam was done as well as an evaluation and management of chronic conditions .  During the course of the visit the patient was educated and counseled about appropriate screening and preventive services including :  diabetes screening, lipid analysis with projected  10 year  risk for CAD , nutrition counseling, breast, cervical and colorectal cancer screening, and recommended immunizations.  Printed recommendations for health maintenance screenings was given 

## 2016-03-21 NOTE — Assessment & Plan Note (Signed)
Last colonoscopy was done in  2017

## 2016-03-21 NOTE — Assessment & Plan Note (Signed)
Statin intolerant, improved with quercetin  Lab Results  Component Value Date   CHOL 207 (H) 12/16/2015   HDL 64 12/16/2015   LDLCALC 119 (H) 12/16/2015   LDLDIRECT 130 (H) 12/16/2015   TRIG 121 12/16/2015   CHOLHDL 3.2 12/16/2015    

## 2016-05-29 ENCOUNTER — Encounter: Payer: Self-pay | Admitting: Internal Medicine

## 2016-06-19 ENCOUNTER — Other Ambulatory Visit (INDEPENDENT_AMBULATORY_CARE_PROVIDER_SITE_OTHER): Payer: Managed Care, Other (non HMO)

## 2016-06-19 DIAGNOSIS — I10 Essential (primary) hypertension: Secondary | ICD-10-CM

## 2016-06-19 NOTE — Addendum Note (Signed)
Addended by: Arby Barrette on: 06/19/2016 08:38 AM   Modules accepted: Orders

## 2016-06-20 LAB — COMPREHENSIVE METABOLIC PANEL
ALBUMIN: 4.4 g/dL (ref 3.6–4.8)
ALT: 26 IU/L (ref 0–32)
AST: 28 IU/L (ref 0–40)
Albumin/Globulin Ratio: 1.8 (ref 1.2–2.2)
Alkaline Phosphatase: 58 IU/L (ref 39–117)
BUN / CREAT RATIO: 13 (ref 12–28)
BUN: 13 mg/dL (ref 8–27)
Bilirubin Total: 0.3 mg/dL (ref 0.0–1.2)
CALCIUM: 9.6 mg/dL (ref 8.7–10.3)
CO2: 27 mmol/L (ref 18–29)
Chloride: 102 mmol/L (ref 96–106)
Creatinine, Ser: 0.97 mg/dL (ref 0.57–1.00)
GFR, EST AFRICAN AMERICAN: 69 mL/min/{1.73_m2} (ref >59)
GFR, EST NON AFRICAN AMERICAN: 60 mL/min/{1.73_m2} (ref >59)
Globulin, Total: 2.4 g/dL (ref 1.5–4.5)
Glucose: 78 mg/dL (ref 65–99)
POTASSIUM: 4.6 mmol/L (ref 3.5–5.2)
Sodium: 141 mmol/L (ref 134–144)
TOTAL PROTEIN: 6.8 g/dL (ref 6.0–8.5)

## 2016-06-22 ENCOUNTER — Encounter: Payer: Self-pay | Admitting: Internal Medicine

## 2016-07-31 ENCOUNTER — Other Ambulatory Visit: Payer: Self-pay | Admitting: Internal Medicine

## 2016-09-18 ENCOUNTER — Ambulatory Visit (INDEPENDENT_AMBULATORY_CARE_PROVIDER_SITE_OTHER): Payer: Managed Care, Other (non HMO) | Admitting: Internal Medicine

## 2016-09-18 ENCOUNTER — Encounter: Payer: Self-pay | Admitting: Internal Medicine

## 2016-09-18 VITALS — BP 130/96 | HR 56 | Temp 97.8°F | Resp 16 | Ht 64.5 in | Wt 163.0 lb

## 2016-09-18 DIAGNOSIS — E785 Hyperlipidemia, unspecified: Secondary | ICD-10-CM

## 2016-09-18 DIAGNOSIS — M2011 Hallux valgus (acquired), right foot: Secondary | ICD-10-CM | POA: Diagnosis not present

## 2016-09-18 DIAGNOSIS — K449 Diaphragmatic hernia without obstruction or gangrene: Secondary | ICD-10-CM | POA: Diagnosis not present

## 2016-09-18 DIAGNOSIS — F330 Major depressive disorder, recurrent, mild: Secondary | ICD-10-CM

## 2016-09-18 DIAGNOSIS — K219 Gastro-esophageal reflux disease without esophagitis: Secondary | ICD-10-CM

## 2016-09-18 DIAGNOSIS — Z1231 Encounter for screening mammogram for malignant neoplasm of breast: Secondary | ICD-10-CM | POA: Diagnosis not present

## 2016-09-18 DIAGNOSIS — I10 Essential (primary) hypertension: Secondary | ICD-10-CM | POA: Diagnosis not present

## 2016-09-18 DIAGNOSIS — Z1239 Encounter for other screening for malignant neoplasm of breast: Secondary | ICD-10-CM

## 2016-09-18 DIAGNOSIS — E7800 Pure hypercholesterolemia, unspecified: Secondary | ICD-10-CM

## 2016-09-18 DIAGNOSIS — M21611 Bunion of right foot: Secondary | ICD-10-CM

## 2016-09-18 MED ORDER — DOXYCYCLINE HYCLATE 100 MG PO TABS: 100.0000 mg | ORAL_TABLET | Freq: Two times a day (BID) | ORAL | 0 refills | Status: DC

## 2016-09-18 MED ORDER — FAMOTIDINE 20 MG PO TABS: 20.0000 mg | ORAL_TABLET | Freq: Two times a day (BID) | ORAL | 0 refills | Status: DC

## 2016-09-18 MED ORDER — VALSARTAN 80 MG PO TABS: 80.0000 mg | ORAL_TABLET | Freq: Every day | ORAL | 0 refills | Status: DC

## 2016-09-18 NOTE — Patient Instructions (Addendum)
We are making a Summertime change in BP medications.  Valsartan alone,  No hct daily.  [facility]   One week after the change in medication,  Bring your home BP machine back to the office for  An RN visit to check accuracy.   We discussed my concern about continuing your PPI (Prilosec) in light of the recently published studies suggesting an association with increased risk of dementia and kidney failure.  I advised  you to try using Pepcid  (famotidine ) 150 mg and I sent  an rx to your [facility].    if your reflux symptoms are controlled,  You can Continue the daily h2 blocker. If not,  We will resume a covered PPI

## 2016-09-18 NOTE — Progress Notes (Signed)
Subjective:  Patient ID: Grace [name], female    DOB: [birth year]  Age: 71 y.o. MRN: [id number]  CC: The primary encounter diagnosis was Hyperlipidemia LDL goal <130. 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  HPI Grace [name] presents for follow up on hypertension .Patient is taking her medications as prescribed and notes no adverse effects.  Home BP readings have been done about once per week and are  generally < 130/80 .  She is avoiding added salt in her diet and walking regularly about 3 times per week for exercise. 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 .  Discussed stopping hctz for summer months.   Taking nexium 1-2 per week .  taking it daily did not help the episodic epigastric pain she has been having for years.  Using d-limonene (orange peel extract) which seems to help.  She describes the pain as  brief episodes of epigastric pressure with position change that occur intermittently  throughout the day.  Attributes it to her hiatal hernia found by diagnostic  EGD 2016. Not related to eating . Has caused vomiting but infrequently. Attributes the vomiting to excessive sinus drainage and from reflux . Last ENDO was 2016,  Hiatal hernia found Collene Mares)   Mammogram overdue   Outpatient Medications Prior to Visit  Medication Sig Dispense Refill  . cetirizine (ZYRTEC) 5 MG tablet Take 5 mg by mouth daily.    Marland Kitchen esomeprazole (NEXIUM) 20 MG capsule Take 20 mg by mouth daily at 12 noon.    Marland Kitchen FLUoxetine (PROZAC) 40 MG capsule TAKE ONE CAPSULE BY MOUTH ONCE DAILY 30 capsule 5  . Multiple Vitamin (MULTIVITAMIN) tablet Take 1 tablet by mouth daily.    Marland Kitchen omeprazole (PRILOSEC) 40 MG capsule Take  40 mg by mouth daily.    . valsartan-hydrochlorothiazide (DIOVAN-HCT) 80-12.5 MG tablet TAKE 1 TABLET BY MOUTH  DAILY 90 tablet 1  . QUERCETIN PO Take by mouth.     No facility-administered medications prior to visit.     Review of Systems;  Patient denies headache, fevers, malaise, unintentional weight loss, skin rash, eye pain, sinus congestion and sinus pain, sore throat, dysphagia,  hemoptysis , cough, dyspnea, wheezing, chest pain, palpitations, orthopnea, edema, abdominal pain, nausea, melena, diarrhea, constipation, flank pain, dysuria, hematuria, urinary  Frequency, nocturia, numbness, tingling, seizures,  Focal weakness, Loss of consciousness,  Tremor, insomnia, depression, anxiety, and suicidal ideation.      Objective:  BP (!) 130/96 (BP Location: Left Arm, Patient Position: Sitting, Cuff Size: Normal)   Pulse (!) 56   Temp 97.8 F (36.6 C) (Oral)   Resp 16   Ht 5' 4.5" (1.638 m)   Wt 163 lb (73.9 kg)   SpO2 97%   BMI 27.55 kg/m   BP Readings from Last 3 Encounters:  09/18/16 (!) 130/96  03/20/16 122/78  12/16/15 140/90    Wt Readings from Last 3 Encounters:  09/18/16 163 lb (73.9 kg)  03/20/16 163 lb 4 oz (74 kg)  12/16/15 167 lb (75.8 kg)    General appearance: alert, cooperative and appears stated age Ears: normal TM's and external ear canals both ears Throat: lips, mucosa, and tongue normal; teeth and gums normal Neck: no adenopathy, no carotid bruit, supple, symmetrical, trachea midline and thyroid not enlarged,  symmetric, no tenderness/mass/nodules Back: symmetric, no curvature. ROM normal. No CVA tenderness. Lungs: clear to auscultation bilaterally Heart: regular rate and rhythm, S1, S2 normal, no murmur, click, rub or gallop Abdomen: soft, non-tender; bowel sounds normal; no masses,  no organomegaly Pulses: 2+ and symmetric Skin: Skin color, texture, turgor normal. No rashes or lesions Lymph nodes: Cervical, supraclavicular, and axillary nodes  normal.  No results found for: HGBA1C  Lab Results  Component Value Date   CREATININE 0.97 06/19/2016   CREATININE 0.88 12/16/2015   CREATININE 0.89 11/25/2014    Lab Results  Component Value Date   WBC 7.4 11/25/2014   HGB 14.0 11/25/2014   HCT 41.2 11/25/2014   PLT 224.0 11/25/2014   GLUCOSE 78 06/19/2016   CHOL 207 (H) 12/16/2015   TRIG 121 12/16/2015   HDL 64 12/16/2015   LDLDIRECT 130 (H) 12/16/2015   LDLCALC 119 (H) 12/16/2015   ALT 26 06/19/2016   AST 28 06/19/2016   NA 141 06/19/2016   K 4.6 06/19/2016   CL 102 06/19/2016   CREATININE 0.97 06/19/2016   BUN 13 06/19/2016   CO2 27 06/19/2016   TSH 1.83 11/25/2014    US Venous Img Lower Unilateral Right  Result Date: 10/12/2014 CLINICAL DATA:  Acute onset of right lower extremity swelling. Initial encounter. EXAM: RIGHT LOWER EXTREMITY VENOUS DOPPLER ULTRASOUND TECHNIQUE: Gray-scale sonography with graded compression, as well as color Doppler and duplex ultrasound were performed to evaluate the lower extremity deep venous systems from the level of the common femoral vein and including the common femoral, femoral, profunda femoral, popliteal and calf veins including the posterior tibial, peroneal and gastrocnemius veins when visible. The superficial great saphenous vein was also interrogated. Spectral Doppler was utilized to evaluate flow at rest and with distal augmentation maneuvers in the common femoral, femoral and popliteal veins. COMPARISON:  None. FINDINGS: Contralateral Common Femoral Vein: Respiratory phasicity is normal and symmetric with the symptomatic side. No evidence of thrombus. Normal compressibility. Common Femoral Vein: No evidence of thrombus. Normal compressibility, respiratory phasicity and response to augmentation. Saphenofemoral Junction: No evidence of thrombus. Normal compressibility and flow on color Doppler imaging. Profunda Femoral Vein: No evidence of thrombus. Normal compressibility and flow on  color Doppler imaging. Femoral Vein: No evidence of thrombus. Normal compressibility, respiratory phasicity and response to augmentation. Popliteal Vein: No evidence of thrombus. Normal compressibility, respiratory phasicity and response to augmentation. Calf Veins: No evidence of thrombus. Normal compressibility and flow on color Doppler imaging. Superficial Great Saphenous Vein: No evidence of thrombus. Normal compressibility and flow on color Doppler imaging. Venous Reflux:  None. Other Findings:  None. IMPRESSION: No evidence of deep venous thrombosis. Electronically Signed   By: Garald Balding M.D.   On: 10/12/2014 18:04    Assessment & Plan:   Problem List Items Addressed This Visit    Major depressive disorder, recurrent episode ([place])    Recurrent, managed with prozac.  Patient prefers to remain on current medication given recurrence of symptoms with prior  weaning trials. Refills given         GERD (gastroesophageal reflux disease)    Managed with PPI and a natural orange peel extract. EGD 2016; no Barett's,  Hiatal hernia found ([place])      Relevant Medications   famotidine (PEPCID) 20 MG tablet   Hypertension    Stopping hct during summer months due to increased sunlight exposure.  Continue valsartan 160 mg daily,  Increase if needed once hctz has been d/c'd  Lab Results  Component Value Date   CREATININE 0.97 06/19/2016   Lab Results  Component Value Date   NA 141 06/19/2016   K 4.6 06/19/2016   CL 102 06/19/2016   CO2 27 06/19/2016         Relevant Medications   valsartan (DIOVAN) 80 MG tablet   Other Relevant Orders   Comprehensive metabolic panel   Hyperlipidemia    Statin intolerant, improved with quercetin  Lab Results  Component Value Date   CHOL 207 (H) 12/16/2015   HDL 64 12/16/2015   LDLCALC 119 (H) 12/16/2015   LDLDIRECT 130 (H) 12/16/2015   TRIG 121 12/16/2015   CHOLHDL 3.2 12/16/2015         Relevant Medications   valsartan (DIOVAN) 80 MG  tablet   Hallux valgus with bunions    S/p bunion, lesser MT and hammertoe surgery April 2014 AT [facility].       Paraesophageal hiatal hernia    She is symptomatic with frequent episodes of epigastric pressure with change in position, but does NOT want surgical referral.        Other Visit Diagnoses    Hyperlipidemia LDL goal <130    -  Primary   Relevant Medications   valsartan (DIOVAN) 80 MG tablet   Other Relevant Orders   Lipid panel   Screening breast examination       Relevant Orders   MS DIGITAL SCREENING BILATERAL   MM DIGITAL SCREENING BILATERAL     A total of 25 minutes of face to face time was spent with patient more than half of which was spent in counselling about the above mentioned conditions  and coordination of care   I have discontinued Ms. Handa's QUERCETIN PO, omeprazole, and valsartan-hydrochlorothiazide. I am also having her start on valsartan and doxycycline. Additionally, I am having her maintain her cetirizine, multivitamin, esomeprazole, FLUoxetine, and famotidine.  Meds ordered this encounter  Medications  . valsartan (DIOVAN) 80 MG tablet    Sig: Take 1 tablet (80 mg total) by mouth daily.    Dispense:  90 tablet    Refill:  0  . doxycycline (VIBRA-TABS) 100 MG tablet    Sig: Take 1 tablet (100 mg total) by mouth 2 (two) times daily. With food    Dispense:  20 tablet    Refill:  0  . DISCONTD: famotidine (PEPCID) 20 MG tablet    Sig: Take 1 tablet (20 mg total) by mouth 2 (two) times daily.    Dispense:  180 tablet    Refill:  0  . famotidine (PEPCID) 20 MG tablet    Sig: Take 1 tablet (20 mg total) by mouth 2 (two) times daily.    Dispense:  180 tablet    Refill:  0    Medications Discontinued During This Encounter  Medication Reason  . QUERCETIN PO Patient has not taken in last 30 days  . valsartan-hydrochlorothiazide (DIOVAN-HCT) 80-12.5 MG tablet   . famotidine (PEPCID) 20 MG tablet Reorder  . omeprazole (PRILOSEC) 40 MG capsule      Follow-up: Return in about 2 weeks (around 10/02/2016), or BP check , for October fasting labs needed .   Crecencio Mc, MD

## 2016-09-19 DIAGNOSIS — K449 Diaphragmatic hernia without obstruction or gangrene: Secondary | ICD-10-CM | POA: Insufficient documentation

## 2016-09-19 NOTE — Assessment & Plan Note (Addendum)
Stopping hct during summer months due to increased sunlight exposure.  Continue valsartan 160 mg daily,  Increase if needed once hctz has been d/c'd  Lab Results  Component Value Date   CREATININE 0.97 06/19/2016   Lab Results  Component Value Date   NA 141 06/19/2016   K 4.6 06/19/2016   CL 102 06/19/2016   CO2 27 06/19/2016

## 2016-09-19 NOTE — Assessment & Plan Note (Signed)
Recurrent, managed with prozac.  Patient prefers to remain on current medication given recurrence of symptoms with prior  weaning trials. Refills given

## 2016-09-19 NOTE — Assessment & Plan Note (Signed)
Statin intolerant, improved with quercetin  Lab Results  Component Value Date   CHOL 207 (H) 12/16/2015   HDL 64 12/16/2015   LDLCALC 119 (H) 12/16/2015   LDLDIRECT 130 (H) 12/16/2015   TRIG 121 12/16/2015   CHOLHDL 3.2 12/16/2015

## 2016-09-19 NOTE — Assessment & Plan Note (Signed)
Managed with PPI and a natural orange peel extract. EGD 2016; no Barett's,  Hiatal hernia found ([place])

## 2016-09-19 NOTE — Assessment & Plan Note (Signed)
S/p bunion, lesser MT and hammertoe surgery April 2014 AT [facility].

## 2016-09-19 NOTE — Assessment & Plan Note (Signed)
She is symptomatic with frequent episodes of epigastric pressure with change in position, but does NOT want surgical referral.

## 2016-09-21 ENCOUNTER — Telehealth: Payer: Self-pay | Admitting: *Deleted

## 2016-09-21 ENCOUNTER — Encounter: Payer: Self-pay | Admitting: Internal Medicine

## 2016-09-21 NOTE — Telephone Encounter (Signed)
Have you scheduled a mammogram for this patient?

## 2016-09-21 NOTE — Telephone Encounter (Signed)
Called [facility] and scheduled pt an appt for a screening mammogram. Called pt to let her know that her appt is scheduled for 09/26/2016 @ 3:30pm. Pt stated that she was okay with that appt date and time.

## 2016-10-16 ENCOUNTER — Telehealth: Payer: Self-pay | Admitting: Internal Medicine

## 2016-10-16 MED ORDER — LOSARTAN POTASSIUM-HCTZ 100-25 MG PO TABS: 1.0000 | ORAL_TABLET | Freq: Every day | ORAL | 3 refills | Status: DC

## 2016-10-16 NOTE — Telephone Encounter (Signed)
The patient seems unsure as to the reason the HCTZ was removed from her Valsartan. Her mail in order company refuses to refill she asked that a prescription be called into her local pharmacy.

## 2016-10-16 NOTE — Telephone Encounter (Signed)
Notified patient medication called in to [facility].

## 2016-10-16 NOTE — Telephone Encounter (Signed)
Losartan/hct 100/25 called in .

## 2016-10-16 NOTE — Telephone Encounter (Signed)
On July 11/2016 PCP dc HCTZ from Valsartan , being this medication is under recall patient pharmacy will not refill new script for valsartan, patient needs new medication to replace valsartan 160 mg, patient also mentioned on phone BP has been running high since stopping HCTZ as high at time as 160/105 patient could not give pulse , she did state with rest this BP comes down. Patient is scheduled for nurse visit on 10/18/16 and bring home cuff in for comparison.

## 2016-10-18 ENCOUNTER — Ambulatory Visit (INDEPENDENT_AMBULATORY_CARE_PROVIDER_SITE_OTHER): Payer: Managed Care, Other (non HMO)

## 2016-10-18 VITALS — BP 130/88 | HR 71 | Resp 20

## 2016-10-18 DIAGNOSIS — I10 Essential (primary) hypertension: Secondary | ICD-10-CM

## 2016-10-18 NOTE — Progress Notes (Signed)
  Patient came in for BP check.  On 8/6 spoke with LPN in office to schedule to have [place] check her BP machine and compare to manual check in office. Checked with two different machines, had a wrist cuff and a upper arm one.  Had patient demonstrate her method to checking with both.  On Left wrist reading was 133/110, HR 79, then with upper arm cuff on left it was 129/96, HR 74.  Checked with manual cuff and recorded in [facility].   Patient did want you to know initially when switching to Losartan after last check she takes it in the AM and about 3-4 hours later, she had episodes of lightheadness when getting up, only lasted a few seconds and then went away, not bothersome , just wanted you to know.   Please advise,

## 2016-11-03 LAB — HM MAMMOGRAPHY

## 2016-12-21 ENCOUNTER — Telehealth: Payer: Self-pay | Admitting: Internal Medicine

## 2016-12-21 MED ORDER — LOSARTAN POTASSIUM-HCTZ 100-25 MG PO TABS: 1.0000 | ORAL_TABLET | Freq: Every day | ORAL | 3 refills | Status: DC

## 2016-12-21 MED ORDER — FLUOXETINE HCL 40 MG PO CAPS: 40.0000 mg | ORAL_CAPSULE | Freq: Every day | ORAL | 2 refills | Status: DC

## 2016-12-21 NOTE — Telephone Encounter (Signed)
Pt has a new insurance. She needs to speak to someone about this and getting a refill on her losartan-hydrochlorothiazide (HYZAAR) 100-25 MG tablet and FLUoxetine (PROZAC) 40 MG capsule for mail order. Pt states that her new mail order pharmacy would be sending a fax to Dr. Derrel Nip, [facility].

## 2016-12-22 ENCOUNTER — Telehealth: Payer: Self-pay

## 2016-12-22 NOTE — Telephone Encounter (Signed)
Thank you :)

## 2016-12-22 NOTE — Telephone Encounter (Signed)
Patient has picked up short supply of losartan/hct without paying out of pocket.

## 2016-12-22 NOTE — Telephone Encounter (Signed)
Patient came in this morning asking what she should do about her blood pressure medication. She has not received her script of losartan/hct from [facility] yet and is out. I have called in 7 day supply to [facility] for her to take until mail order is delivered. [facility] is supposed to be calling her insurance company to see if they will cover the short supply. Patient is aware of above.

## 2016-12-26 ENCOUNTER — Other Ambulatory Visit: Payer: Managed Care, Other (non HMO)

## 2017-01-08 ENCOUNTER — Other Ambulatory Visit (INDEPENDENT_AMBULATORY_CARE_PROVIDER_SITE_OTHER): Payer: Managed Care, Other (non HMO)

## 2017-01-08 DIAGNOSIS — I10 Essential (primary) hypertension: Secondary | ICD-10-CM | POA: Diagnosis not present

## 2017-01-08 DIAGNOSIS — E785 Hyperlipidemia, unspecified: Secondary | ICD-10-CM

## 2017-01-09 LAB — COMPREHENSIVE METABOLIC PANEL
A/G RATIO: 2.1 (ref 1.2–2.2)
ALT: 25 IU/L (ref 0–32)
AST: 22 IU/L (ref 0–40)
Albumin: 4.5 g/dL (ref 3.6–4.8)
Alkaline Phosphatase: 56 IU/L (ref 39–117)
BILIRUBIN TOTAL: 0.4 mg/dL (ref 0.0–1.2)
BUN/Creatinine Ratio: 11 — ABNORMAL LOW (ref 12–28)
BUN: 11 mg/dL (ref 8–27)
CALCIUM: 9.4 mg/dL (ref 8.7–10.3)
CHLORIDE: 102 mmol/L (ref 96–106)
CO2: 25 mmol/L (ref 20–29)
Creatinine, Ser: 0.98 mg/dL (ref 0.57–1.00)
GFR calc Af Amer: 68 mL/min/{1.73_m2} (ref >59)
GFR, EST NON AFRICAN AMERICAN: 59 mL/min/{1.73_m2} — AB (ref >59)
GLUCOSE: 92 mg/dL (ref 65–99)
Globulin, Total: 2.1 g/dL (ref 1.5–4.5)
POTASSIUM: 4.1 mmol/L (ref 3.5–5.2)
Sodium: 142 mmol/L (ref 134–144)
Total Protein: 6.6 g/dL (ref 6.0–8.5)

## 2017-01-09 LAB — LIPID PANEL
CHOL/HDL RATIO: 3.8 ratio (ref 0.0–4.4)
Cholesterol, Total: 231 mg/dL — ABNORMAL HIGH (ref 100–199)
HDL: 61 mg/dL (ref >39)
LDL CALC: 144 mg/dL — AB (ref 0–99)
TRIGLYCERIDES: 128 mg/dL (ref 0–149)
VLDL Cholesterol Cal: 26 mg/dL (ref 5–40)

## 2017-01-10 ENCOUNTER — Encounter: Payer: Self-pay | Admitting: Internal Medicine

## 2017-01-12 NOTE — Telephone Encounter (Signed)
Pt came into off and stated that she would like to try the statin medication again for her cholesterol. Please send to  [facility]

## 2017-01-13 ENCOUNTER — Other Ambulatory Visit: Payer: Self-pay | Admitting: Internal Medicine

## 2017-01-13 DIAGNOSIS — Z79899 Other long term (current) drug therapy: Secondary | ICD-10-CM

## 2017-01-13 DIAGNOSIS — E7800 Pure hypercholesterolemia, unspecified: Secondary | ICD-10-CM

## 2017-01-15 ENCOUNTER — Encounter: Payer: Self-pay | Admitting: Internal Medicine

## 2017-03-25 ENCOUNTER — Other Ambulatory Visit: Payer: Self-pay | Admitting: Internal Medicine

## 2017-03-26 MED ORDER — FAMOTIDINE 20 MG PO TABS: 20.0000 mg | ORAL_TABLET | Freq: Two times a day (BID) | ORAL | 0 refills | Status: DC

## 2017-04-16 ENCOUNTER — Other Ambulatory Visit: Payer: Managed Care, Other (non HMO)

## 2017-05-03 ENCOUNTER — Other Ambulatory Visit: Payer: Managed Care, Other (non HMO)

## 2017-05-03 ENCOUNTER — Other Ambulatory Visit (INDEPENDENT_AMBULATORY_CARE_PROVIDER_SITE_OTHER): Payer: Managed Care, Other (non HMO)

## 2017-05-03 DIAGNOSIS — E7800 Pure hypercholesterolemia, unspecified: Secondary | ICD-10-CM | POA: Diagnosis not present

## 2017-05-03 DIAGNOSIS — Z79899 Other long term (current) drug therapy: Secondary | ICD-10-CM

## 2017-05-03 NOTE — Addendum Note (Signed)
Addended by: Leeanne Rio on: 05/03/2017 08:02 AM   Modules accepted: Orders

## 2017-05-04 LAB — COMPREHENSIVE METABOLIC PANEL
A/G RATIO: 2.3 — AB (ref 1.2–2.2)
ALT: 19 IU/L (ref 0–32)
AST: 19 IU/L (ref 0–40)
Albumin: 4.5 g/dL (ref 3.5–4.8)
Alkaline Phosphatase: 48 IU/L (ref 39–117)
BUN/Creatinine Ratio: 16 (ref 12–28)
BUN: 16 mg/dL (ref 8–27)
Bilirubin Total: 0.4 mg/dL (ref 0.0–1.2)
CALCIUM: 9.4 mg/dL (ref 8.7–10.3)
CO2: 27 mmol/L (ref 20–29)
CREATININE: 0.99 mg/dL (ref 0.57–1.00)
Chloride: 103 mmol/L (ref 96–106)
GFR calc Af Amer: 67 mL/min/{1.73_m2} (ref >59)
GFR, EST NON AFRICAN AMERICAN: 58 mL/min/{1.73_m2} — AB (ref >59)
Globulin, Total: 2 g/dL (ref 1.5–4.5)
Glucose: 97 mg/dL (ref 65–99)
POTASSIUM: 4.3 mmol/L (ref 3.5–5.2)
Sodium: 142 mmol/L (ref 134–144)
Total Protein: 6.5 g/dL (ref 6.0–8.5)

## 2017-05-04 LAB — LIPID PANEL
CHOL/HDL RATIO: 3.5 ratio (ref 0.0–4.4)
Cholesterol, Total: 221 mg/dL — ABNORMAL HIGH (ref 100–199)
HDL: 63 mg/dL (ref >39)
LDL CALC: 139 mg/dL — AB (ref 0–99)
Triglycerides: 96 mg/dL (ref 0–149)
VLDL Cholesterol Cal: 19 mg/dL (ref 5–40)

## 2017-05-05 ENCOUNTER — Encounter: Payer: Self-pay | Admitting: Internal Medicine

## 2017-05-09 ENCOUNTER — Ambulatory Visit: Payer: Self-pay | Admitting: *Deleted

## 2017-05-09 NOTE — Telephone Encounter (Signed)
Pt called with a c/o of her right leg on the outer part feels prickly when she crosses the left over the right one. She denies pain, swelling, redness, or shortness of breath and is not on blood thinners. She denies hx of blood clots.  Stated she had communicated with Dr.Tullo regarding this.  Her provider is out of the office.  Appointment made for tomorrow. No protocol found for prickly leg.   Answer Assessment - Initial Assessment Questions 1. ONSET: "When did the pain start?"      A couple of days ago 2. LOCATION: "Where is the pain located?"      Right leg below the knee 3. PAIN: "How bad is the pain?"    (Scale 1-10; or mild, moderate, severe)   -  MILD (1-3): doesn't interfere with normal activities    -  MODERATE (4-7): interferes with normal activities (e.g., work or school) or awakens from sleep, limping    -  SEVERE (8-10): excruciating pain, unable to do any normal activities, unable to walk     No pain 4. WORK OR EXERCISE: "Has there been any recent work or exercise that involved this part of the body?"      no 5. CAUSE: "What do you think is causing the leg pain?"     Not sure 6. OTHER SYMPTOMS: "Do you have any other symptoms?" (e.g., chest pain, back pain, breathing difficulty, swelling, rash, fever, numbness, weakness)     no 7. PREGNANCY: "Is there any chance you are pregnant?" "When was your last menstrual period?"     no  Protocols used: LEG PAIN-A-AH

## 2017-05-09 NOTE — Progress Notes (Signed)
Subjective:    Patient ID: Grace [name], female    DOB: [birth year], 72 y.o.   MRN: [id number]  HPI  Grace [name] is a 72 year old female who presents today with a "prickly sensation" that occurs in her right leg when she crosses them and places pressure on this area.  Last fall, she reports seeking  a provider at a walk in clinic where she was treated for a rash in this same area. The rash did not itch or was painful and this resolved with steroid cream. She denies history of shingles Onset of symptoms: "several weeks"  Timing:She is not able to link this to a trauma but did note a small bruise on her lower right leg. Aggravating factor: Pressure on area and crossing legs Alleviating factor: Uncrossing legs she reports "I do not feel it" Treatment: Epsom salt cream has provided limited benefit.  She denies fever, chills, sweats, edema, erythema, pain, numbness, fatigue, or limited ROM. History of mass on left foot in 2016 that was noted to be either a lipoma or ganglion cyst. She was advised to monitor and follow up if she noted pain or limited ROM. No pain noted today Recent lab results 05/03/17 noted that cholesterol had improved and liver and kidney function were WNL. She will have follow up lab work in 6 months as recommended by her PCP    Review of Systems  Constitutional: Negative for chills, fatigue and fever.  Respiratory: Negative for cough, shortness of breath and wheezing.   Cardiovascular: Negative for chest pain, palpitations and leg swelling.  Gastrointestinal: Negative for abdominal pain.  Musculoskeletal: Negative for back pain, gait problem, joint swelling and myalgias.       Tingling of right leg when legs are crossed  Neurological: Negative for dizziness, weakness, light-headedness, numbness and headaches.  Psychiatric/Behavioral:       Denies depressed or anxious mood   Past Medical History:  Diagnosis Date  . allergic rhinitis   . Benign heart murmur   .  Depression    resolved  . GERD (gastroesophageal reflux disease)   . Hx of colonic polyps   . Hyperlipidemia   . Hypertension      Social History   Socioeconomic History  . Marital status: Married    Spouse name: Not on file  . Number of children: Not on file  . Years of education: Not on file  . Highest education level: Not on file  Social Needs  . Financial resource strain: Not on file  . Food insecurity - worry: Not on file  . Food insecurity - inability: Not on file  . Transportation needs - medical: Not on file  . Transportation needs - non-medical: Not on file  Occupational History  . Not on file  Tobacco Use  . Smoking status: Never Smoker  . Smokeless tobacco: Never Used  Substance and Sexual Activity  . Alcohol use: No  . Drug use: No  . Sexual activity: Not on file  Other Topics Concern  . Not on file  Social History Narrative  . Not on file    Past Surgical History:  Procedure Laterality Date  . ABDOMINAL HYSTERECTOMY    . TOTAL ABDOMINAL HYSTERECTOMY W/ BILATERAL SALPINGOOPHORECTOMY  1991   excessive bleeding    Family History  Problem Relation Age of Onset  . Mental illness Mother 19       alzheimers dementia  . Cancer Father 62       prostate  .  Heart disease Father        pacemaker,  no AMI    No Known Allergies  Current Outpatient Medications on File Prior to Visit  Medication Sig Dispense Refill  . cetirizine (ZYRTEC) 5 MG tablet Take 5 mg by mouth daily.    Marland Kitchen esomeprazole (NEXIUM) 20 MG capsule Take 20 mg by mouth daily at 12 noon.    . famotidine (PEPCID) 20 MG tablet Take 1 tablet (20 mg total) by mouth 2 (two) times daily. 180 tablet 0  . FLUoxetine (PROZAC) 40 MG capsule Take 1 capsule (40 mg total) by mouth daily. 90 capsule 2  . losartan-hydrochlorothiazide (HYZAAR) 100-25 MG tablet Take 1 tablet by mouth daily. 90 tablet 3  . Multiple Vitamin (MULTIVITAMIN) tablet Take 1 tablet by mouth daily.     No current  facility-administered medications on file prior to visit.     BP 130/88   Pulse (!) 56   Temp 98.3 F (36.8 C) (Oral)   Resp 15   Wt 160 lb 6 oz (72.7 kg)   SpO2 98%   BMI 27.10 kg/m        Objective:   Physical Exam  Constitutional: She is oriented to person, place, and time. She appears well-developed and well-nourished.  Eyes: Pupils are equal, round, and reactive to light. No scleral icterus.  Neck: Neck supple.  Cardiovascular: Normal rate, regular rhythm and intact distal pulses.  Pulses:      Dorsalis pedis pulses are 2+ on the right side, and 2+ on the left side.       Posterior tibial pulses are 2+ on the right side, and 2+ on the left side.  Pulmonary/Chest: Effort normal and breath sounds normal. She has no wheezes. She has no rales.  Abdominal: Soft. Bowel sounds are normal. There is no tenderness. There is no rebound.  Musculoskeletal: She exhibits no edema.  Telangiectasis noted on lower extremities bilaterally.  Lymphadenopathy:    She has no cervical adenopathy.  Neurological: She is alert and oriented to person, place, and time. She has normal strength. No sensory deficit. Coordination and gait normal.  Motor: 5/5 bilaterally with normal tone and bulk Ambulates with a coordinated gait  Skin: Skin is warm and dry. No rash noted.  Psychiatric: She has a normal mood and affect. Her behavior is normal. Judgment and thought content normal.        Assessment & Plan:  1. Tingling sensation Exam and history are reassuring today. No edema, erythema, or pain present today. 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 We agreed to obtain lab work today and she is also interested in evaluation at the [facility] related to treatment for varicosities. We further reviewed options of stretching exercises and will obtain lab work today. If lab work is normal, she will seek evaluation at [facility] and consider EMG or NCS if no improvement or worsening symptoms.  Delano Metz, FNP-C

## 2017-05-10 ENCOUNTER — Ambulatory Visit: Payer: Managed Care, Other (non HMO) | Admitting: Family Medicine

## 2017-05-10 ENCOUNTER — Encounter: Payer: Self-pay | Admitting: Family Medicine

## 2017-05-10 VITALS — BP 130/88 | HR 56 | Temp 98.3°F | Resp 15 | Wt 160.4 lb

## 2017-05-10 DIAGNOSIS — R202 Paresthesia of skin: Secondary | ICD-10-CM

## 2017-05-10 NOTE — Patient Instructions (Signed)
Please go to the lab before you leave. [facility] in [place] is a good option for evaluation as discussed. Their number is [phone].  If symptoms do not improve with conservative measures of avoiding crossing your legs or if they worsen or you develop new symptoms such as swelling or pain, please seek medical attention.   Paresthesia Paresthesia is a burning or prickling feeling. This feeling can happen in any part of the body. It often happens in the hands, arms, legs, or feet. Usually, it is not painful. In most cases, the feeling goes away in a short time and is not a sign of a serious problem. Follow these instructions at home:  Avoid drinking alcohol.  Try massage or needle therapy (acupuncture) to help with your problems.  Keep all follow-up visits as told by your doctor. This is important. Contact a doctor if:  You keep on having episodes of paresthesia.  Your burning or prickling feeling gets worse when you walk.  You have pain or cramps.  You feel dizzy.  You have a rash. Get help right away if:  You feel weak.  You have trouble walking or moving.  You have problems speaking, understanding, or seeing.  You feel confused.  You cannot control when you pee (urinate) or poop (bowel movement).  You lose feeling (numbness) after an injury.  You pass out (faint). This information is not intended to replace advice given to you by your health care provider. Make sure you discuss any questions you have with your health care provider. Document Released: 02/10/2008 Document Revised: 08/05/2015 Document Reviewed: 02/23/2014 Elsevier Interactive Patient Education  [employer].

## 2017-05-11 LAB — TSH: TSH: 1.81 u[IU]/mL (ref 0.450–4.500)

## 2017-05-11 LAB — MAGNESIUM: MAGNESIUM: 2.2 mg/dL (ref 1.6–2.3)

## 2017-05-11 LAB — VITAMIN B12: VITAMIN B 12: 366 pg/mL (ref 232–1245)

## 2017-05-14 ENCOUNTER — Other Ambulatory Visit: Payer: Self-pay | Admitting: Internal Medicine

## 2017-05-20 ENCOUNTER — Encounter: Payer: Self-pay | Admitting: Internal Medicine

## 2017-05-20 DIAGNOSIS — R202 Paresthesia of skin: Principal | ICD-10-CM

## 2017-05-20 DIAGNOSIS — R200 Anesthesia of skin: Secondary | ICD-10-CM

## 2017-05-22 NOTE — Telephone Encounter (Signed)
If symptoms are persisting a referral to neurology can be placed for testing which may include nerve conduction studies. If she would like a referral, this can be completed.

## 2017-05-22 NOTE — Telephone Encounter (Signed)
Please advise recommendation of a scan on 05/10/17?

## 2017-05-25 NOTE — Telephone Encounter (Signed)
I do not think a leg scan is necessary.  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  I have made a Neurology referral as suggested by Almyra Free

## 2017-05-31 NOTE — Telephone Encounter (Signed)
Left message to call back  

## 2017-05-31 NOTE — Telephone Encounter (Signed)
If symptom of tingling remains, referral to neurology can be placed. Treatment for evaluation of varicose veins can be considered at [facility]. Patient can call [facility] if she would like an appointment. If she is interested in evaluation of tingling if symptom has persisted, neurology referral can be placed.

## 2017-06-01 NOTE — Telephone Encounter (Signed)
Patient is returning [place] phone call. Please call back. [phone]

## 2017-06-04 ENCOUNTER — Encounter: Payer: Self-pay | Admitting: Internal Medicine

## 2017-06-04 NOTE — Telephone Encounter (Signed)
See previous mychart message

## 2017-06-26 ENCOUNTER — Encounter: Payer: Self-pay | Admitting: Emergency Medicine

## 2017-06-26 ENCOUNTER — Emergency Department: Payer: Managed Care, Other (non HMO)

## 2017-06-26 ENCOUNTER — Other Ambulatory Visit: Payer: Self-pay

## 2017-06-26 DIAGNOSIS — Z79899 Other long term (current) drug therapy: Secondary | ICD-10-CM | POA: Insufficient documentation

## 2017-06-26 DIAGNOSIS — I10 Essential (primary) hypertension: Secondary | ICD-10-CM | POA: Insufficient documentation

## 2017-06-26 DIAGNOSIS — Y999 Unspecified external cause status: Secondary | ICD-10-CM | POA: Insufficient documentation

## 2017-06-26 DIAGNOSIS — Y929 Unspecified place or not applicable: Secondary | ICD-10-CM | POA: Insufficient documentation

## 2017-06-26 DIAGNOSIS — W0110XA Fall on same level from slipping, tripping and stumbling with subsequent striking against unspecified object, initial encounter: Secondary | ICD-10-CM | POA: Diagnosis not present

## 2017-06-26 DIAGNOSIS — R55 Syncope and collapse: Secondary | ICD-10-CM | POA: Insufficient documentation

## 2017-06-26 DIAGNOSIS — Y9389 Activity, other specified: Secondary | ICD-10-CM | POA: Diagnosis not present

## 2017-06-26 DIAGNOSIS — S0003XA Contusion of scalp, initial encounter: Secondary | ICD-10-CM | POA: Insufficient documentation

## 2017-06-26 DIAGNOSIS — S0990XA Unspecified injury of head, initial encounter: Secondary | ICD-10-CM | POA: Diagnosis present

## 2017-06-26 NOTE — ED Triage Notes (Addendum)
Patient ambulatory to triage with steady gait, without difficulty or distress noted; pt reports fell while trying to mount horse approx 7pm while wearing helmet; +LOC and right sided HA since; denies any other c/o or injuries

## 2017-06-27 ENCOUNTER — Encounter: Payer: Self-pay | Admitting: Internal Medicine

## 2017-06-27 ENCOUNTER — Emergency Department
Admission: EM | Admit: 2017-06-27 | Discharge: 2017-06-27 | Disposition: A | Discharge status: Home / Self Care | Payer: Managed Care, Other (non HMO) | Attending: Emergency Medicine | Admitting: Emergency Medicine

## 2017-06-27 DIAGNOSIS — R030 Elevated blood-pressure reading, without diagnosis of hypertension: Secondary | ICD-10-CM

## 2017-06-27 DIAGNOSIS — W19XXXA Unspecified fall, initial encounter: Secondary | ICD-10-CM

## 2017-06-27 DIAGNOSIS — S0990XA Unspecified injury of head, initial encounter: Secondary | ICD-10-CM

## 2017-06-27 MED ORDER — IBUPROFEN 600 MG PO TABS
600.0000 mg | ORAL_TABLET | Freq: Once | ORAL | Status: AC
  Administered 2017-06-27: 600 mg via ORAL
  Filled 2017-06-27: qty 1

## 2017-06-27 NOTE — ED Notes (Signed)
Pt states she did have positive LOC with fall. Pt states she has posterior headache. Skin pwd, resps unlabored. Pt denies nausea, vomiting.

## 2017-06-27 NOTE — Discharge Instructions (Signed)
Please take ibuprofen as needed for headache and make an appointment to follow-up with your primary care physician this coming week for recheck.  Return to the emergency department sooner for any concerns whatsoever.  It was a pleasure to take care of you today, and thank you for coming to our emergency department.  If you have any questions or concerns before leaving please ask the nurse to grab me and I'm more than happy to go through your aftercare instructions again.  If you were prescribed any opioid pain medication today such as Norco, Vicodin, Percocet, morphine, hydrocodone, or oxycodone please make sure you do not drive when you are taking this medication as it can alter your ability to drive safely.  If you have any concerns once you are home that you are not improving or are in fact getting worse before you can make it to your follow-up appointment, please do not hesitate to call 911 and come back for further evaluation.  Darel Hong, MD  Results for orders placed or performed in visit on 05/10/17  B12  Result Value Ref Range   Vitamin B-12 366 232 - 1,245 pg/mL  Magnesium  Result Value Ref Range   Magnesium 2.2 1.6 - 2.3 mg/dL  TSH  Result Value Ref Range   TSH 1.810 0.450 - 4.500 uIU/mL   Ct Head Wo Contrast  Result Date: 06/26/2017 CLINICAL DATA:  Fall while mounting a horse.  Headache. EXAM: CT HEAD WITHOUT CONTRAST CT CERVICAL SPINE WITHOUT CONTRAST TECHNIQUE: Multidetector CT imaging of the head and cervical spine was performed following the standard protocol without intravenous contrast. Multiplanar CT image reconstructions of the cervical spine were also generated. COMPARISON:  None. FINDINGS: CT HEAD FINDINGS Brain: No mass lesion, intraparenchymal hemorrhage or extra-axial collection. No evidence of acute cortical infarct. Brain parenchyma and CSF-containing spaces are normal for age. Vascular: No hyperdense vessel or unexpected calcification. Skull: Normal visualized  skull base, calvarium and extracranial soft tissues. Sinuses/Orbits: No sinus fluid levels or advanced mucosal thickening. No mastoid effusion. Normal orbits. CT CERVICAL SPINE FINDINGS Alignment: Reversal of normal cervical lordosis. Occipital condyles and lateral masses of C1 and C2 are aligned. No facet widening. Skull base and vertebrae: No acute fracture. Soft tissues and spinal canal: No prevertebral fluid or swelling. No visible canal hematoma. 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 Upper chest: No pneumothorax, pulmonary nodule or pleural effusion. Other: Normal visualized paraspinal cervical soft tissues. IMPRESSION: 1. Normal head CT. 2. No acute cervical spine fracture. 3. Reversal of the normal cervical lordosis, likely degenerative. Electronically Signed   By: Ulyses Jarred M.D.   On: 06/26/2017 22:00   Ct Cervical Spine Wo Contrast  Result Date: 06/26/2017 CLINICAL DATA:  Fall while mounting a horse.  Headache. EXAM: CT HEAD WITHOUT CONTRAST CT CERVICAL SPINE WITHOUT CONTRAST TECHNIQUE: Multidetector CT imaging of the head and cervical spine was performed following the standard protocol without intravenous contrast. Multiplanar CT image reconstructions of the cervical spine were also generated. COMPARISON:  None. FINDINGS: CT HEAD FINDINGS Brain: No mass lesion, intraparenchymal hemorrhage or extra-axial collection. No evidence of acute cortical infarct. Brain parenchyma and CSF-containing spaces are normal for age. Vascular: No hyperdense vessel or unexpected calcification. Skull: Normal visualized skull base, calvarium and extracranial soft tissues. Sinuses/Orbits: No sinus fluid levels or advanced mucosal thickening. No mastoid effusion. Normal orbits. CT CERVICAL SPINE FINDINGS Alignment: Reversal of normal cervical lordosis. Occipital condyles and lateral masses of C1 and C2 are aligned. No facet widening.  Skull base and vertebrae: No acute fracture. Soft tissues and spinal  canal: No prevertebral fluid or swelling. No visible canal hematoma. 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 Upper chest: No pneumothorax, pulmonary nodule or pleural effusion. Other: Normal visualized paraspinal cervical soft tissues. IMPRESSION: 1. Normal head CT. 2. No acute cervical spine fracture. 3. Reversal of the normal cervical lordosis, likely degenerative. Electronically Signed   By: Ulyses Jarred M.D.   On: 06/26/2017 22:00

## 2017-06-27 NOTE — ED Provider Notes (Signed)
[facility] Emergency Department Provider Note  ____________________________________________   First MD Initiated Contact with Patient 06/27/17 [phone]     (approximate)  I have reviewed the triage vital signs and the nursing notes.   HISTORY  Chief Complaint Fall   HPI Grace [name] is a 72 y.o. female self presents the emergency department with headache after a mechanical fall.  She was attempting to get up onto ride a horse when she slipped fell backwards and struck the back right side of her head.  She was wearing a helmet but she did lose consciousness.  She has sudden onset moderate severity throbbing aching headache.  No double vision or blurred vision.  No numbness or weakness.  No nausea or vomiting.  No neck pain.  She takes a daily baby aspirin but no blood thinning medication.  Her symptoms are rapidly improving.  Her headache is nonradiating.  Past Medical History:  Diagnosis Date  . allergic rhinitis   . Benign heart murmur   . Depression    resolved  . GERD (gastroesophageal reflux disease)   . Hx of colonic polyps   . Hyperlipidemia   . Hypertension     Patient Active Problem List   Diagnosis Date Noted  . 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 Mass of left foot 08/01/2014  . Statin intolerance 07/29/2014  . Constipation 05/14/2014  . Encounter for preventive health examination 12/18/2012  . Hallux valgus with bunions 02/17/2012  . Allergic rhinitis due to pollen 08/13/2011  . Hyperlipidemia 08/13/2011  . Major depressive disorder, recurrent episode ([place])   . GERD (gastroesophageal reflux disease)   . Hypertension   . Benign heart murmur   . Hx of colonic polyps     Past Surgical History:  Procedure Laterality Date  . ABDOMINAL HYSTERECTOMY    . TOTAL ABDOMINAL HYSTERECTOMY W/ BILATERAL SALPINGOOPHORECTOMY  1991   excessive bleeding    Prior to Admission medications   Medication Sig Start Date End Date Taking?  Authorizing Provider  cetirizine (ZYRTEC) 5 MG tablet Take 5 mg by mouth daily.    [provider]  esomeprazole (NEXIUM) 20 MG capsule Take 20 mg by mouth daily at 12 noon.    [provider]  famotidine (PEPCID) 20 MG tablet TAKE 1 TABLET BY MOUTH TWO  TIMES DAILY 05/14/17   Crecencio Mc, MD  FLUoxetine (PROZAC) 40 MG capsule Take 1 capsule (40 mg total) by mouth daily. 12/21/16   Crecencio Mc, MD  losartan-hydrochlorothiazide (HYZAAR) 100-25 MG tablet Take 1 tablet by mouth daily. 12/21/16   Crecencio Mc, MD  Multiple Vitamin (MULTIVITAMIN) tablet Take 1 tablet by mouth daily.    [provider]    Allergies Patient has no known allergies.  Family History  Problem Relation Age of Onset  . Mental illness Mother 7       alzheimers dementia  . Cancer Father 20       prostate  . Heart disease Father        pacemaker,  no AMI    Social History Social History   Tobacco Use  . Smoking status: Never Smoker  . Smokeless tobacco: Never Used  Substance Use Topics  . Alcohol use: No  . Drug use: No    Review of Systems Constitutional: No fever/chills Eyes: No visual changes. ENT: No sore throat. Cardiovascular: Denies chest pain. Respiratory: Denies shortness of breath. Gastrointestinal: No abdominal pain.  No nausea, no vomiting.  No  diarrhea.  No constipation. Genitourinary: Negative for dysuria. Musculoskeletal: Negative for back pain. Skin: Negative for rash. Neurological: Positive for headache  ____________________________________________   PHYSICAL EXAM:  VITAL SIGNS: ED Triage Vitals  Enc Vitals Group     BP 06/26/17 2033 (!) 196/118     Pulse Rate 06/26/17 2033 88     Resp 06/26/17 2033 18     Temp 06/26/17 2033 98.4 F (36.9 C)     Temp Source 06/26/17 2033 Oral     SpO2 06/26/17 2033 96 %     Weight 06/26/17 2036 164 lb (74.4 kg)     Height 06/26/17 2036 5\' 6"  (1.676 m)     Head Circumference --      Peak Flow --       Pain Score 06/26/17 2041 8     Pain Loc --      Pain Edu? --      Excl. in [place]? --     Constitutional: Alert and oriented x4 well-appearing nontoxic no diaphoresis speaks in full clear sentences Eyes: PERRL EOMI. mid range and brisk Head: Mild hematoma to right occiput. Nose: No congestion/rhinnorhea. Mouth/Throat: No trismus Neck: No stridor.  No midline tenderness or step-offs Cardiovascular: Normal rate, regular rhythm. Grossly normal heart sounds.  Good peripheral circulation. Respiratory: Normal respiratory effort.  No retractions. Lungs CTAB and moving good air Gastrointestinal: Soft nontender Musculoskeletal: No lower extremity edema   Neurologic:  Normal speech and language. No gross focal neurologic deficits are appreciated. Skin:  Skin is warm, dry and intact. No rash noted. Psychiatric: Mood and affect are normal. Speech and behavior are normal.    ____________________________________________   DIFFERENTIAL includes but not limited to  Intracerebral hemorrhage, cervical spine fracture, concussion ____________________________________________   LABS (all labs ordered are listed, but only abnormal results are displayed)  Labs Reviewed - No data to display   __________________________________________  EKG   ____________________________________________  RADIOLOGY  Head CT and cervical spine CTs reviewed by me with no acute disease ____________________________________________   PROCEDURES  Procedure(s) performed: no  Procedures  Critical Care performed: no  Observation: no ____________________________________________   INITIAL IMPRESSION / ASSESSMENT AND PLAN / ED COURSE  Pertinent labs & imaging results that were available during my care of the patient were reviewed by me and considered in my medical decision making (see chart for details).  By the time I saw the patient she had negative CT scans.  Feels improved after ibuprofen.  Her neuro exam  is normal.  I discussed concussion precautions.  She is discharged home in improved condition verbalizes understanding and agreement the plan.    ----------------------------------------- 3:37 AM on 06/27/2017 -----------------------------------------  The patient's head and neck CTs are negative.  She is neuro intact.  She does not take blood thinning medication.  Her pain is adequately controlled.  She had a clear mechanical fall and did not syncopized.  I appreciate her slightly elevated blood pressure and I have referred her back to primary care.  ____________________________________________   FINAL CLINICAL IMPRESSION(S) / ED DIAGNOSES  Final diagnoses:  Fall, initial encounter  Minor head injury, initial encounter  Elevated blood pressure reading      NEW MEDICATIONS STARTED DURING THIS VISIT:  Discharge Medication List as of 06/27/2017  3:36 AM       Note:  This document was prepared using Dragon voice recognition software and may include unintentional dictation errors.     Darel Hong, MD 06/27/17 [phone]

## 2017-06-29 ENCOUNTER — Encounter: Payer: Self-pay | Admitting: Internal Medicine

## 2017-07-03 ENCOUNTER — Encounter: Payer: Self-pay | Admitting: Internal Medicine

## 2017-07-03 DIAGNOSIS — Z87820 Personal history of traumatic brain injury: Secondary | ICD-10-CM | POA: Insufficient documentation

## 2017-07-09 ENCOUNTER — Ambulatory Visit: Payer: Managed Care, Other (non HMO) | Admitting: Internal Medicine

## 2017-07-09 ENCOUNTER — Encounter: Payer: Self-pay | Admitting: Internal Medicine

## 2017-07-09 DIAGNOSIS — Z87820 Personal history of traumatic brain injury: Secondary | ICD-10-CM | POA: Diagnosis not present

## 2017-07-09 NOTE — Patient Instructions (Signed)
Concussion, Adult  A concussion is a brain injury from a direct hit (blow) to the head or body. This injury causes the brain to shake quickly back and forth inside the skull. It is caused by:   A hit to the head.   A quick and sudden movement (jolt) of the head or neck.    How fast you will get better from a concussion depends on many things like how bad your concussion was, what part of your brain was hurt, how old you are, and how healthy you were before the concussion. Recovery can take time. It is important to wait to return to activity until a doctor says it is safe and your symptoms are all gone.  Follow these instructions at home:  Activity   Limit activities that need a lot of thought or concentration. These include:  ? Homework or work for your job.  ? Watching TV.  ? Computer work.  ? Playing memory games and puzzles.   Rest. Rest helps the brain to heal. Make sure you:  ? Get plenty of sleep at night. Do not stay up late.  ? Go to bed at the same time every day.  ? Rest during the day. Take naps or rest breaks when you feel tired.   It can be dangerous if you get another concussion before the first one has healed Do not do activities that could cause a second concussion, such as riding a bike or playing sports.   Ask your doctor when you can return to your normal activities, like driving, riding a bike, or using machinery. Your ability to react may be slower. Do not do these activities if you are dizzy. Your doctor will likely give you a plan for slowly going back to activities.  General instructions   Take over-the-counter and prescription medicines only as told by your doctor.   Do not drink alcohol until your doctor says you can.   If it is harder than usual to remember things, write them down.   If you are easily distracted, try to do one thing at a time. For example, do not try to watch TV while making dinner.   Talk with family members or close friends when you need to make important  decisions.   Watch your symptoms and tell other people to do the same. Other problems (complications) can happen after a concussion. Older adults with a brain injury may have a higher risk of serious problems, such as a blood clot in the brain.   Tell your teachers, school nurse, school counselor, coach, athletic trainer, or work manager about your injury and symptoms. Tell them about what you can or cannot do. They should watch for:  ? More problems with attention or concentration.  ? More trouble remembering or learning new information.  ? More time needed to do tasks or assignments.  ? Being more annoyed (irritable) or having a harder time dealing with stress.  ? Any other symptoms that get worse.   Keep all follow-up visits as told by your health care provider. This is important.  Prevention   It is very important that you donot get another brain injury, especially before you have healed. In rare cases, another injury can cause permanent brain damage, brain swelling, or death. You have the most risk if you get another head injury in the first 7-10 days after you were hurt before. To avoid injuries:  ? Wear a seat belt when you ride in   a car.  ? Do not drink too much alcohol.  ? Avoid activities that could make you get a second concussion, like contact sports.  ? Wear a helmet when you do activities like:   Biking.   Skiing.   Skateboarding.   Skating.  ? Make your home safe by:   Removing things from the floor or stairs that could make you trip.   Using grab bars in bathrooms and handrails by stairs.   Placing non-slip mats on floors and in bathtubs.   Putting more light in dark areas.  Contact a doctor if:   Your symptoms get worse.   You have new symptoms.   You keep having symptoms for more than 2 weeks.  Get help right away if:   You have bad headaches, or your headaches get worse.   You have weakness in any part of your body.   You have loss of feeling (numbness).   You feel off  balance.   You keep throwing up (vomiting).   You feel more sleepy.   The black center of one eye (pupil) is bigger than the other one.   You twitch or shake violently (convulse) or have a seizure.   Your speech is not clear (is slurred).   You feel more tired, more confused, or more annoyed.   You do not recognize people or places.   You have neck pain.   It is hard to wake you up.   You have strange behavior changes.   You pass out (lose consciousness).  Summary   A concussion is a brain injury from a direct hit (blow) to the head or body.   This condition is treated with rest and careful watching of symptoms.   If you keep having symptoms for more than 2 weeks, call your doctor.  This information is not intended to replace advice given to you by your health care provider. Make sure you discuss any questions you have with your health care provider.  Document Released: 02/15/2009 Document Revised: 02/12/2016 Document Reviewed: 02/12/2016  Elsevier Interactive Patient Education  2017 [employer]

## 2017-07-09 NOTE — Progress Notes (Signed)
Subjective:  Patient ID: Grace [name], female    DOB: 02/14/1946  Age: 72 y.o. MRN: [id number]  CC: The encounter diagnosis was History of multiple concussions.  HPI Grace [name] presents for follow up on ED visit for blunt head  trauma resulting in  LOC after falling off a horse .  The fall and LOC was witnessed.  Patient fell to the side , was wearing a helmet.  Hit head on the ground right sided of head.  Had immediate on right side , had a few minutes of confusion and amnesia that lasting  < 45 minutes,  Went to ER . Head CT normal.  Told she did not have a concussion and sent home  Still having headache,  althought bette.r some Loss of balance with change in poisition , lying ot sitting   Did not rest brain bc no instructions given..  Still having a headaceh   Outpatient Medications Prior to Visit  Medication Sig Dispense Refill  . cetirizine (ZYRTEC) 5 MG tablet Take 5 mg by mouth daily.    Marland Kitchen esomeprazole (NEXIUM) 20 MG capsule Take 20 mg by mouth daily at 12 noon.    . famotidine (PEPCID) 20 MG tablet TAKE 1 TABLET BY MOUTH TWO  TIMES DAILY 180 tablet 0  . FLUoxetine (PROZAC) 40 MG capsule Take 1 capsule (40 mg total) by mouth daily. 90 capsule 2  . losartan-hydrochlorothiazide (HYZAAR) 100-25 MG tablet Take 1 tablet by mouth daily. 90 tablet 3  . Multiple Vitamin (MULTIVITAMIN) tablet Take 1 tablet by mouth daily.     No facility-administered medications prior to visit.     Review of Systems;  Patient denies fevers, malaise, unintentional weight loss, skin rash, eye pain, sinus congestion and sinus pain, sore throat, dysphagia,  hemoptysis , cough, dyspnea, wheezing, chest pain, palpitations, orthopnea, edema, abdominal pain, nausea, melena, diarrhea, constipation, flank pain, dysuria, hematuria, urinary  Frequency, nocturia, numbness, tingling, seizures,  Focal weakness, Loss of consciousness,  Tremor, insomnia, depression, anxiety, and suicidal ideation.       Objective:  BP (!) 148/80 (BP Location: Left Arm, Patient Position: Sitting, Cuff Size: Normal)   Pulse 64   Temp 98.2 F (36.8 C) (Oral)   Resp 14   Ht 5\' 6"  (1.676 m)   Wt 161 lb 3.2 oz (73.1 kg)   SpO2 96%   BMI 26.02 kg/m   BP Readings from Last 3 Encounters:  07/09/17 (!) 148/80  06/27/17 (!) 166/94  05/10/17 130/88    Wt Readings from Last 3 Encounters:  07/09/17 161 lb 3.2 oz (73.1 kg)  06/26/17 164 lb (74.4 kg)  05/10/17 160 lb 6 oz (72.7 kg)    General appearance: alert, cooperative and appears stated age Ears: normal TM's and external ear canals both ears Throat: lips, mucosa, and tongue normal; teeth and gums normal Neck: no adenopathy, no carotid bruit, supple, symmetrical, trachea midline and thyroid not enlarged, symmetric, no tenderness/mass/nodules Back: symmetric, no curvature. ROM normal. No CVA tenderness. Lungs: clear to auscultation bilaterally Heart: regular rate and rhythm, S1, S2 normal, no murmur, click, rub or gallop Abdomen: soft, non-tender; bowel sounds normal; no masses,  no organomegaly Pulses: 2+ and symmetric Skin: Skin color, texture, turgor normal. No rashes or lesions Lymph nodes: Cervical, supraclavicular, and axillary nodes normal. Neuro:  awake and interactive with normal mood and affect. Higher cortical functions are normal. Speech is clear without word-finding difficulty or dysarthria. Extraocular movements are intact. Visual fields of both  eyes are grossly intact. Sensation to light touch is grossly intact bilaterally of upper and lower extremities. Motor examination shows 4+/5 symmetric hand grip and upper extremity and 5/5 lower extremity strength. There is no pronation or drift. Gait is non-ataxic    No results found for: HGBA1C  Lab Results  Component Value Date   CREATININE 0.99 05/03/2017   CREATININE 0.98 01/08/2017   CREATININE 0.97 06/19/2016    Lab Results  Component Value Date   WBC 7.4 11/25/2014   HGB  14.0 11/25/2014   HCT 41.2 11/25/2014   PLT 224.0 11/25/2014   GLUCOSE 97 05/03/2017   CHOL 221 (H) 05/03/2017   TRIG 96 05/03/2017   HDL 63 05/03/2017   LDLDIRECT 130 (H) 12/16/2015   LDLCALC 139 (H) 05/03/2017   ALT 19 05/03/2017   AST 19 05/03/2017   NA 142 05/03/2017   K 4.3 05/03/2017   CL 103 05/03/2017   CREATININE 0.99 05/03/2017   BUN 16 05/03/2017   CO2 27 05/03/2017   TSH 1.810 05/10/2017    No results found.  Assessment & Plan:   Problem List Items Addressed This Visit    History of multiple concussions    Concussion care outlined.           I am having Grace [name]. Grace "Gerald Stabs" maintain her cetirizine, multivitamin, esomeprazole, losartan-hydrochlorothiazide, FLUoxetine, and famotidine.  No orders of the defined types were placed in this encounter.   There are no discontinued medications.  Follow-up: Return in about 6 months (around 01/08/2018) for hypertension .   Crecencio Mc, MD

## 2017-07-10 NOTE — Assessment & Plan Note (Addendum)
Patient is stable post discharge from ER and has no new issues or questions about her discharge plans.  concussion care outlined.

## 2017-07-13 ENCOUNTER — Other Ambulatory Visit: Payer: Self-pay

## 2017-07-13 MED ORDER — FLUOXETINE HCL 40 MG PO CAPS: 40.0000 mg | ORAL_CAPSULE | Freq: Every day | ORAL | 2 refills | Status: DC

## 2017-10-14 ENCOUNTER — Other Ambulatory Visit: Payer: Self-pay | Admitting: Internal Medicine

## 2017-10-30 ENCOUNTER — Other Ambulatory Visit: Payer: Self-pay | Admitting: Internal Medicine

## 2018-01-16 ENCOUNTER — Ambulatory Visit: Payer: Managed Care, Other (non HMO) | Admitting: Internal Medicine

## 2018-01-16 ENCOUNTER — Encounter: Payer: Self-pay | Admitting: Internal Medicine

## 2018-01-16 VITALS — BP 118/86 | HR 69 | Temp 98.0°F | Resp 14 | Ht 66.0 in | Wt 158.6 lb

## 2018-01-16 DIAGNOSIS — Z1239 Encounter for other screening for malignant neoplasm of breast: Secondary | ICD-10-CM

## 2018-01-16 DIAGNOSIS — K449 Diaphragmatic hernia without obstruction or gangrene: Secondary | ICD-10-CM | POA: Diagnosis not present

## 2018-01-16 DIAGNOSIS — G629 Polyneuropathy, unspecified: Secondary | ICD-10-CM

## 2018-01-16 DIAGNOSIS — I10 Essential (primary) hypertension: Secondary | ICD-10-CM | POA: Diagnosis not present

## 2018-01-16 DIAGNOSIS — N6082 Other benign mammary dysplasias of left breast: Secondary | ICD-10-CM

## 2018-01-16 DIAGNOSIS — Z0000 Encounter for general adult medical examination without abnormal findings: Secondary | ICD-10-CM | POA: Diagnosis not present

## 2018-01-16 DIAGNOSIS — Z86010 Personal history of colonic polyps: Secondary | ICD-10-CM

## 2018-01-16 DIAGNOSIS — M792 Neuralgia and neuritis, unspecified: Secondary | ICD-10-CM

## 2018-01-16 MED ORDER — DOXYCYCLINE HYCLATE 100 MG PO TABS: 100.0000 mg | ORAL_TABLET | Freq: Two times a day (BID) | ORAL | 0 refills | Status: DC

## 2018-01-16 MED ORDER — LOSARTAN POTASSIUM-HCTZ 100-25 MG PO TABS: 1.0000 | ORAL_TABLET | Freq: Every day | ORAL | 3 refills | Status: DC

## 2018-01-16 NOTE — Progress Notes (Addendum)
Patient ID: KAMORAH NEVILS, female    DOB: Aug 22, 1945  Age: 72 y.o. MRN: [id number]  The patient is here for annual preventive examination and management of other chronic and acute problems  .  Colon polyps by last screening colonoscopy in 2017,   due every 5 years  Mammogram  normal august 2018 [facility]     The risk factors are reflected in the social history.  The roster of all physicians providing medical care to patient - is listed in the Snapshot section of the chart.  Activities of daily living:  The patient is 100% independent in all ADLs: dressing, toileting, feeding as well as independent mobility  Home safety : The patient has smoke detectors in the home. They wear seatbelts.  There are no firearms at home. There is no violence in the home.   There is no risks for hepatitis, STDs or HIV. There is no   history of blood transfusion. They have no travel history to infectious disease endemic areas of the world.  The patient has seen their dentist in the last six month. They have seen their eye doctor in the last year. They admit to slight hearing difficulty with regard to whispered voices and some television programs.  They have deferred audiologic testing in the last year.  They do not  have excessive sun exposure. Discussed the need for sun protection: hats, long sleeves and use of sunscreen if there is significant sun exposure.   Diet: the importance of a healthy diet is discussed. They do have a healthy diet.  The benefits of regular aerobic exercise were discussed. She walks 4 times per week ,  20 minutes.   Depression screen: there are no signs or vegative symptoms of depression- irritability, change in appetite, anhedonia, sadness/tearfullness.  Cognitive assessment: the patient manages all their financial and personal affairs and is actively engaged. They could relate day,date,year and events; recalled 2/3 objects at 3 minutes; performed clock-face test normally.  The following  portions of the patient's history were reviewed and updated as appropriate: allergies, current medications, past family history, past medical history,  past surgical history, past social history  and problem list.  Visual acuity was not assessed per patient preference since she has regular follow up with her ophthalmologist. Hearing and body mass index were assessed and reviewed.   During the course of the visit the patient was educated and counseled about appropriate screening and preventive services including : fall prevention , diabetes screening, nutrition counseling, colorectal cancer screening, and recommended immunizations.    CC: The primary encounter diagnosis was Neuropathy. Diagnoses of Essential hypertension, Breast cancer screening, Encounter for preventive health examination, Sebaceous cyst of breast, left, Paraesophageal hiatal hernia, Neuralgia of right lower extremity, and Hx of colonic polyps were also pertinent to this visit.   Lower extremity neuralgia:  Started in Dec 2018 after a shingles episode in November  :  She reports persistent tingling of right lateral  leg from the  knee  Down,  Occasionally occurring (once a week) on the left side as well .not sure if it is provoked. By crossing legs .  Has not been screened for heavy metals by recent Neurologic evaluation  In May .  Screening labs were normal,  EMG/[place] study was advised but not done   Recurrence of nodule / boil under left breast.  Was drained a year ago .  Exam c/w a sebaceous cyst   hiatal hernia with GERD by EGD done 3 years  ago  Takes pepcid and nexium     History Nadja has a past medical history of allergic rhinitis, Benign heart murmur, Depression, GERD (gastroesophageal reflux disease), colonic polyps, Hyperlipidemia, and Hypertension.   She has a past surgical history that includes Abdominal hysterectomy and Total abdominal hysterectomy w/ bilateral salpingoophorectomy (1991).   Her family history  includes Cancer (age of onset: 40) in her father; Heart disease in her father; Mental illness (age of onset: 75) in her mother.She reports that she has never smoked. She has never used smokeless tobacco. She reports that she does not drink alcohol or use drugs.  Outpatient Medications Prior to Visit  Medication Sig Dispense Refill  . cetirizine (ZYRTEC) 5 MG tablet Take 5 mg by mouth daily.    Marland Kitchen esomeprazole (NEXIUM) 20 MG capsule Take 20 mg by mouth daily at 12 noon.    . famotidine (PEPCID) 20 MG tablet TAKE 1 TABLET BY MOUTH TWO  TIMES DAILY 180 tablet 1  . FLUoxetine (PROZAC) 40 MG capsule Take 1 capsule (40 mg total) by mouth daily. 90 capsule 2  . Multiple Vitamin (MULTIVITAMIN) tablet Take 1 tablet by mouth daily.    Marland Kitchen losartan-hydrochlorothiazide (HYZAAR) 100-25 MG tablet TAKE 1 TABLET BY MOUTH  DAILY 90 tablet 3   No facility-administered medications prior to visit.     Review of Systems   Patient denies headache, fevers, malaise, unintentional weight loss, skin rash, eye pain, sinus congestion and sinus pain, sore throat, dysphagia,  hemoptysis , cough, dyspnea, wheezing, chest pain, palpitations, orthopnea, edema, abdominal pain, nausea, melena, diarrhea, constipation, flank pain, dysuria, hematuria, urinary  Frequency, nocturia, , seizures,  Focal weakness, Loss of consciousness,  Tremor, insomnia, depression, anxiety, and suicidal ideation.      Objective:  BP 118/86 (BP Location: Left Arm, Patient Position: Sitting, Cuff Size: Normal)   Pulse 69   Temp 98 F (36.7 C) (Oral)   Resp 14   Ht '5\' 6"'  (1.676 m)   Wt 158 lb 9.6 oz (71.9 kg)   SpO2 97%   BMI 25.60 kg/m   Physical Exam   General appearance: alert, cooperative and appears stated age Ears: normal TM's and external ear canals both ears Throat: lips, mucosa, and tongue normal; teeth and gums normal Neck: no adenopathy, no carotid bruit, supple, symmetrical, trachea midline and thyroid not enlarged, symmetric, no  tenderness/mass/nodules Back: symmetric, no curvature. ROM normal. No CVA tenderness. Lungs: clear to auscultation bilaterally Heart: regular rate and rhythm, S1, S2 normal, no murmur, click, rub or gallop Abdomen: soft, non-tender; bowel sounds normal; no masses,  no organomegaly Pulses: 2+ and symmetric Skin: Skin color, texture, turgor normal. No rashes or lesions Lymph nodes: Cervical, supraclavicular, and axillary nodes normal. Neuro: CNs 2-12 intact. DTRs 2+/4 in biceps, brachioradialis, patellars and achilles. Muscle strength 5/5 in upper and lower exremities. Fine resting tremor bilaterally both hands cerebellar function normal. Romberg negative.  No pronator drift.   Gait normal.      Assessment & Plan:   Problem List Items Addressed This Visit    Encounter for preventive health examination    age appropriate education and counseling updated, referrals for preventative services and immunizations addressed, dietary and smoking counseling addressed, most recent labs reviewed.  I have personally reviewed and have noted:  1) the patient's medical and social history 2) The pt's use of alcohol, tobacco, and illicit drugs 3) The patient's current medications and supplements 4) Functional ability including ADL's, fall risk, home safety risk, hearing  and visual impairment 5) Diet and physical activities 6) Evidence for depression or mood disorder 7) The patient's height, weight, and BMI have been recorded in the chart  I have made referrals, and provided counseling and education based on review of the above      Hx of colonic polyps    Reportedly done in 2017 by Dr Collene Mares. No records available and no path report in Epic    Requesting records.       Hypertension    Well controlled on current regimen. Renal function stable, no changes today.  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 Marland Kitchen  Heavy metals screen ordered.        Paraesophageal hiatal hernia    She is less symptomatic with regular use of Nexium and famotidine.  And continues to defer surgical referral.       Sebaceous cyst of breast, left    Recommend surgical consult for definitive removal but she declines.  If it is not currently infected,  But is rather large, about the size of a chickpea.  Recommend using hot compresses to encourage spontaneous drainage,  rx for doxycycline given to use prn for signs of infection        Other Visit Diagnoses    Neuropathy    -  Primary   Relevant Orders   Heavy Metals Profile, Urine   Breast cancer screening       Relevant Orders   MM DIGITAL SCREENING BILATERAL      I have changed Shirlyn Goltz. Cerniglia "Chris"'s losartan-hydrochlorothiazide. I am also having her start on doxycycline. Additionally, I am having her maintain her cetirizine, multivitamin, esomeprazole, FLUoxetine, and famotidine.  Meds ordered this encounter  Medications  . doxycycline (VIBRA-TABS) 100 MG tablet    Sig: Take 1 tablet (100 mg total) by mouth 2 (two) times daily.    Dispense:  20 tablet    Refill:  0  . losartan-hydrochlorothiazide (HYZAAR) 100-25 MG tablet    Sig: Take 1 tablet by mouth daily.    Dispense:  90 tablet    Refill:  3    Medications Discontinued During This Encounter  Medication Reason  . losartan-hydrochlorothiazide (HYZAAR) 100-25 MG tablet Reorder    Follow-up: Return in about 6 months (around 07/17/2018).   Crecencio Mc, MD

## 2018-01-16 NOTE — Patient Instructions (Addendum)
Allegra is available generically as fexofenadine and it comes in 60 mg and 180 mg once daily strengths.   It is non drowsy   claritin is also available generically as loratidine .    Your nodule under left breast is a sebaceous cyst. They can get recurrent "plugged" with sebum (see below) and then get infeted with surface bacteria from your skin   Use warm /hot compresses on it daily and swab with alcohol or astringent daily  Start doxycycline  if you develop redness or worsening tenderness  If you start the antibiotic,  Please take a probiotic ( Align, Floraque or Culturelle) of the generic version of one of these  For a minimum of 3 weeks to prevent a serious antibiotic associated diarrhea  Called clostridium dificile colitis    You can take famotidine every 12 hours if needed for GERD

## 2018-01-17 LAB — COMPREHENSIVE METABOLIC PANEL
A/G RATIO: 1.9 (ref 1.2–2.2)
ALBUMIN: 4.5 g/dL (ref 3.5–4.8)
ALK PHOS: 47 IU/L (ref 39–117)
ALT: 19 IU/L (ref 0–32)
AST: 19 IU/L (ref 0–40)
BUN / CREAT RATIO: 18 (ref 12–28)
BUN: 15 mg/dL (ref 8–27)
Bilirubin Total: 0.5 mg/dL (ref 0.0–1.2)
CO2: 22 mmol/L (ref 20–29)
Calcium: 9.6 mg/dL (ref 8.7–10.3)
Chloride: 100 mmol/L (ref 96–106)
Creatinine, Ser: 0.85 mg/dL (ref 0.57–1.00)
GFR calc Af Amer: 80 mL/min/{1.73_m2} (ref >59)
GFR, EST NON AFRICAN AMERICAN: 70 mL/min/{1.73_m2} (ref >59)
GLOBULIN, TOTAL: 2.4 g/dL (ref 1.5–4.5)
Glucose: 89 mg/dL (ref 65–99)
POTASSIUM: 4.2 mmol/L (ref 3.5–5.2)
Sodium: 141 mmol/L (ref 134–144)
Total Protein: 6.9 g/dL (ref 6.0–8.5)

## 2018-01-19 DIAGNOSIS — N6082 Other benign mammary dysplasias of left breast: Secondary | ICD-10-CM | POA: Insufficient documentation

## 2018-01-19 DIAGNOSIS — M792 Neuralgia and neuritis, unspecified: Secondary | ICD-10-CM | POA: Insufficient documentation

## 2018-01-19 DIAGNOSIS — G629 Polyneuropathy, unspecified: Secondary | ICD-10-CM | POA: Insufficient documentation

## 2018-01-19 NOTE — Assessment & Plan Note (Signed)
She is less symptomatic with regular use of Nexium and famotidine.  And continues to defer surgical referral.

## 2018-01-19 NOTE — Assessment & Plan Note (Signed)
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 Marland Kitchen  Heavy metals screen ordered.

## 2018-01-19 NOTE — Assessment & Plan Note (Signed)
Recommend surgical consult for definitive removal but she declines.  If it is not currently infected,  But is rather large, about the size of a chickpea.  Recommend using hot compresses to encourage spontaneous drainage,  rx for doxycycline given to use prn for signs of infection

## 2018-01-19 NOTE — Assessment & Plan Note (Signed)
Well controlled on current regimen. Renal function stable, no changes today.  Lab Results  Component Value Date   CREATININE 0.85 01/16/2018   Lab Results  Component Value Date   NA 141 01/16/2018   K 4.2 01/16/2018   CL 100 01/16/2018   CO2 22 01/16/2018

## 2018-01-19 NOTE — Assessment & Plan Note (Signed)

## 2018-01-19 NOTE — Assessment & Plan Note (Signed)
Reportedly done in 2017 by Dr Collene Mares. No records available and no path report in Epic    Requesting records.

## 2018-01-21 ENCOUNTER — Other Ambulatory Visit (INDEPENDENT_AMBULATORY_CARE_PROVIDER_SITE_OTHER): Payer: Managed Care, Other (non HMO)

## 2018-01-21 DIAGNOSIS — G629 Polyneuropathy, unspecified: Secondary | ICD-10-CM

## 2018-01-23 LAB — HEAVY METALS PROFILE, URINE
ARSENIC UR: NOT DETECTED ug/L (ref 0–50)
ARSENIC(INORGANIC), U: NOT DETECTED ug/L (ref 0–19)
Creatinine(Crt),U: 0.64 g/L (ref 0.30–3.00)
Lead, Rand Ur: NOT DETECTED ug/L (ref 0–49)
Mercury, Ur: NOT DETECTED ug/L (ref 0–19)

## 2018-01-29 ENCOUNTER — Telehealth: Payer: Self-pay | Admitting: Internal Medicine

## 2018-01-29 MED ORDER — OLMESARTAN MEDOXOMIL-HCTZ 40-25 MG PO TABS: 1.0000 | ORAL_TABLET | Freq: Every day | ORAL | 0 refills | Status: DC

## 2018-01-29 NOTE — Telephone Encounter (Signed)
Copied from [place] [id number]. Topic: Quick Communication - See Telephone Encounter >> Jan 29, 2018  7:38 AM Conception Chancy, NT wrote: CRM for notification. See Telephone encounter for: 01/29/18.  Patient is calling to follow up on her mychart message she sent in regards to losartan-hydrochlorothiazide (HYZAAR) 100-25 MG tablet being out of stock. She states she is needing a replacement sent to her mail order pharmacy as she is completely out of this medication.  [place], [place] [facility] [address] [place] [address] Phone: [phone] Fax: [phone]

## 2018-01-29 NOTE — Telephone Encounter (Signed)
Please advise 

## 2018-01-29 NOTE — Telephone Encounter (Signed)
Spoke with pt and she stated that she received the mychart message that Dr. Derrel Nip sent her. She stated that she will reply back as soon as she can. Asked the pt if she will need a seven day supply and she stated that she thinks she will be fine because she received a message from [facility] stating that they are processing the order.

## 2018-02-01 LAB — HM MAMMOGRAPHY

## 2018-03-17 ENCOUNTER — Other Ambulatory Visit: Payer: Self-pay | Admitting: Internal Medicine

## 2018-04-01 ENCOUNTER — Other Ambulatory Visit: Payer: Self-pay | Admitting: Internal Medicine

## 2018-04-30 MED ORDER — HYDROCHLOROTHIAZIDE 25 MG PO TABS: 25.0000 mg | ORAL_TABLET | Freq: Every day | ORAL | 1 refills | Status: DC

## 2018-04-30 MED ORDER — OLMESARTAN MEDOXOMIL 40 MG PO TABS: 40.0000 mg | ORAL_TABLET | Freq: Every day | ORAL | 1 refills | Status: DC

## 2018-04-30 NOTE — Telephone Encounter (Signed)
Spoke with pt and she stated that she is currently taking the olmesartan/hctz. The losartan/hctz has been discontinued. The pt stated that she is okay with taking the medication separately. I have sent in the HCTZ 25mg  but not sure which olmesartan to send in, Could you please send in the olmesartan 40mg ?

## 2018-04-30 NOTE — Telephone Encounter (Signed)
In reviewing, it appears she was on losartan/hctz.  Apparently this was changed to benicar/hctz.  Need to clarify exactly what she was/is taking.  Also, need to clarify that if company out of combined medication then see if they have the meds individually.

## 2018-04-30 NOTE — Telephone Encounter (Signed)
rx sent in for benicar 40mg  q day for her to take with hctz 25mg  q day.  Let me know if any problems.

## 2018-05-01 ENCOUNTER — Other Ambulatory Visit: Payer: Self-pay | Admitting: Internal Medicine

## 2018-05-01 NOTE — Telephone Encounter (Signed)
Pt called stating there was a hold on her BP medication. Called [facility] and informed pharmacist that the medication combination is out of stock. Clarified the pt is supposed to be on Benicar and HCTZ and will take them in place of the combination drug. Pt called and updated.

## 2018-05-01 NOTE — Telephone Encounter (Signed)
Copied from [place] [id number]. Topic: Quick Communication - Rx Refill/Question >> May 01, 2018 11:58 AM Jeri Cos wrote: Medication: olmesartan (BENICAR) 40 MG tablet  Has the patient contacted their pharmacy? Yes.    (Agent: If yes, when and what did the pharmacy advise?) Pharmacy advised pt that her medicine was on a "hold" status  Preferred Pharmacy (with phone number or street name): [place], [place] [phone] (Phone) [phone] (Fax)    Agent: Please be advised that RX refills may take up to 3 business days. We ask that you follow-up with your pharmacy.

## 2018-08-26 ENCOUNTER — Other Ambulatory Visit: Payer: Self-pay | Admitting: Internal Medicine

## 2018-09-23 ENCOUNTER — Other Ambulatory Visit: Payer: Self-pay | Admitting: Internal Medicine

## 2018-11-20 ENCOUNTER — Other Ambulatory Visit: Payer: Self-pay | Admitting: Internal Medicine

## 2019-01-16 ENCOUNTER — Ambulatory Visit: Payer: Managed Care, Other (non HMO) | Admitting: Internal Medicine

## 2019-01-16 ENCOUNTER — Encounter: Payer: Self-pay | Admitting: Internal Medicine

## 2019-01-16 ENCOUNTER — Other Ambulatory Visit: Payer: Self-pay

## 2019-01-16 VITALS — BP 118/80 | HR 67 | Temp 97.1°F | Ht 65.0 in | Wt 162.4 lb

## 2019-01-16 DIAGNOSIS — Z1239 Encounter for other screening for malignant neoplasm of breast: Secondary | ICD-10-CM | POA: Diagnosis not present

## 2019-01-16 DIAGNOSIS — I10 Essential (primary) hypertension: Secondary | ICD-10-CM | POA: Diagnosis not present

## 2019-01-16 DIAGNOSIS — E7800 Pure hypercholesterolemia, unspecified: Secondary | ICD-10-CM | POA: Diagnosis not present

## 2019-01-16 DIAGNOSIS — F330 Major depressive disorder, recurrent, mild: Secondary | ICD-10-CM

## 2019-01-16 DIAGNOSIS — R5383 Other fatigue: Secondary | ICD-10-CM

## 2019-01-16 DIAGNOSIS — Z0000 Encounter for general adult medical examination without abnormal findings: Secondary | ICD-10-CM

## 2019-01-16 DIAGNOSIS — R944 Abnormal results of kidney function studies: Secondary | ICD-10-CM

## 2019-01-16 DIAGNOSIS — R413 Other amnesia: Secondary | ICD-10-CM | POA: Diagnosis not present

## 2019-01-16 MED ORDER — MOMETASONE FUROATE 0.1 % EX CREA: 1.0000 "application " | TOPICAL_CREAM | Freq: Every day | CUTANEOUS | 0 refills | Status: DC

## 2019-01-16 NOTE — Patient Instructions (Signed)
Your annual mammogram has been ordered.  You are encouraged (required) to call to make your appointment at [facility]   Health Maintenance After Age 71 After age 92, you are at a higher risk for certain long-term diseases and infections as well as injuries from falls. Falls are a major cause of broken bones and head injuries in people who are older than age 58. Getting regular preventive care can help to keep you healthy and well. Preventive care includes getting regular testing and making lifestyle changes as recommended by your health care provider. Talk with your health care provider about:  Which screenings and tests you should have. A screening is a test that checks for a disease when you have no symptoms.  A diet and exercise plan that is right for you. What should I know about screenings and tests to prevent falls? Screening and testing are the best ways to find a health problem early. Early diagnosis and treatment give you the best chance of managing medical conditions that are common after age 72. Certain conditions and lifestyle choices may make you more likely to have a fall. Your health care provider may recommend:  Regular vision checks. Poor vision and conditions such as cataracts can make you more likely to have a fall. If you wear glasses, make sure to get your prescription updated if your vision changes.  Medicine review. Work with your health care provider to regularly review all of the medicines you are taking, including over-the-counter medicines. Ask your health care provider about any side effects that may make you more likely to have a fall. Tell your health care provider if any medicines that you take make you feel dizzy or sleepy.  Osteoporosis screening. Osteoporosis is a condition that causes the bones to get weaker. This can make the bones weak and cause them to break more easily.  Blood pressure screening. Blood pressure changes and medicines to control  blood pressure can make you feel dizzy.  Strength and balance checks. Your health care provider may recommend certain tests to check your strength and balance while standing, walking, or changing positions.  Foot health exam. Foot pain and numbness, as well as not wearing proper footwear, can make you more likely to have a fall.  Depression screening. You may be more likely to have a fall if you have a fear of falling, feel emotionally low, or feel unable to do activities that you used to do.  Alcohol use screening. Using too much alcohol can affect your balance and may make you more likely to have a fall. What actions can I take to lower my risk of falls? General instructions  Talk with your health care provider about your risks for falling. Tell your health care provider if: ? You fall. Be sure to tell your health care provider about all falls, even ones that seem minor. ? You feel dizzy, sleepy, or off-balance.  Take over-the-counter and prescription medicines only as told by your health care provider. These include any supplements.  Eat a healthy diet and maintain a healthy weight. A healthy diet includes low-fat dairy products, low-fat (lean) meats, and fiber from whole grains, beans, and lots of fruits and vegetables. Home safety  Remove any tripping hazards, such as rugs, cords, and clutter.  Install safety equipment such as grab bars in bathrooms and safety rails on stairs.  Keep rooms and walkways well-lit. Activity   Follow a regular exercise program to stay fit. This will help  you maintain your balance. Ask your health care provider what types of exercise are appropriate for you.  If you need a cane or walker, use it as recommended by your health care provider.  Wear supportive shoes that have nonskid soles. Lifestyle  Do not drink alcohol if your health care provider tells you not to drink.  If you drink alcohol, limit how much you have: ? 0-1 drink a day for women.  ? 0-2 drinks a day for men.  Be aware of how much alcohol is in your drink. In the [place] one drink equals one typical bottle of beer (12 oz), one-half glass of wine (5 oz), or one shot of hard liquor (1 oz).  Do not use any products that contain nicotine or tobacco, such as cigarettes and e-cigarettes. If you need help quitting, ask your health care provider. Summary  Having a healthy lifestyle and getting preventive care can help to protect your health and wellness after age 14.  Screening and testing are the best way to find a health problem early and help you avoid having a fall. Early diagnosis and treatment give you the best chance for managing medical conditions that are more common for people who are older than age 21.  Falls are a major cause of broken bones and head injuries in people who are older than age 63. Take precautions to prevent a fall at home.  Work with your health care provider to learn what changes you can make to improve your health and wellness and to prevent falls. This information is not intended to replace advice given to you by your health care provider. Make sure you discuss any questions you have with your health care provider. Document Released: 01/10/2017 Document Revised: 06/20/2018 Document Reviewed: 01/10/2017 Elsevier Patient Education  2020 [employer].

## 2019-01-16 NOTE — Progress Notes (Signed)
Patient ID: Grace [name], female    DOB: 05/28/1945  Age: 73 y.o. MRN: [id number]  The patient is here for annual PREVENTIVE  examination and management of other chronic and acute problems.   The risk factors are reflected in the social history.  The roster of all physicians providing medical care to patient - is listed in the Snapshot section of the chart.  Activities of daily living:  The patient is 100% independent in all ADLs: dressing, toileting, feeding as well as independent mobility  Home safety : The patient has smoke detectors in the home. They wear seatbelts.  There are no firearms at home. There is no violence in the home.   There is no risks for hepatitis, STDs or HIV. There is no   history of blood transfusion. They have no travel history to infectious disease endemic areas of the world.  The patient has seen their dentist in the last six month. They have seen their eye doctor in the last year. They admit to slight hearing difficulty with regard to whispered voices and some television programs.  They have deferred audiologic testing in the last year.  They do not  have excessive sun exposure. Discussed the need for sun protection: hats, long sleeves and use of sunscreen if there is significant sun exposure.   Diet: the importance of a healthy diet is discussed. They do have a healthy diet.  The benefits of regular aerobic exercise were discussed. She walks 4 times per week ,  20 minutes.   Depression screen: there are no signs or vegative symptoms of depression- irritability, change in appetite, anhedonia, sadness/tearfullness.  Cognitive assessment: the patient manages all their financial and personal affairs and is actively engaged. They could relate day,date,year and events; recalled 2/3 objects at 3 minutes; performed clock-face test normally.  The following portions of the patient's history were reviewed and updated as appropriate: allergies, current medications, past  family history, past medical history,  past surgical history, past social history  and problem list.  Visual acuity was not assessed per patient preference since she has regular follow up with her ophthalmologist. Hearing and body mass index were assessed and reviewed.   During the course of the visit the patient was educated and counseled about appropriate screening and preventive services including : fall prevention , diabetes screening, nutrition counseling, colorectal cancer screening, and recommended immunizations.    CC: The primary encounter diagnosis was Pure hypercholesterolemia. Diagnoses of Essential hypertension, Memory difficulties, Fatigue, unspecified type, Encounter for breast cancer screening other than mammogram, Encounter for preventive health examination, and Mild episode of recurrent major depressive disorder ([place]) were also pertinent to this visit.   10 Hypertension: patient checks blood pressure twice weekly at home.  Readings have been for the most part > 140/80 at rest . Patient is following a reduce salt diet most days and is taking medications as prescribed  2( Grace [name] has a past medical history of allergic rhinitis, Benign heart murmur, Depression, GERD (gastroesophageal reflux disease), colonic polyps, Hyperlipidemia, and Hypertension.   She has a past surgical history that includes Abdominal hysterectomy and Total abdominal hysterectomy w/ bilateral salpingoophorectomy (1991).   Her family history includes Cancer (age of onset: 34) in her father; Heart disease in her father; Mental illness (age of onset: 2) in her mother.She reports that she has never smoked. She has never used smokeless tobacco. She reports that she does not drink alcohol or use drugs.  Outpatient Medications  Prior to Visit  Medication Sig Dispense Refill  . cetirizine (ZYRTEC) 5 MG tablet Take 5 mg by mouth daily.    Marland Kitchen doxycycline (VIBRA-TABS) 100 MG tablet Take 1  tablet (100 mg total) by mouth 2 (two) times daily. 20 tablet 0  . esomeprazole (NEXIUM) 20 MG capsule Take 20 mg by mouth daily at 12 noon.    . famotidine (PEPCID) 20 MG tablet TAKE 1 TABLET BY MOUTH TWO  TIMES DAILY 180 tablet 1  . FLUoxetine (PROZAC) 40 MG capsule TAKE 1 CAPSULE BY MOUTH  DAILY 90 capsule 1  . hydrochlorothiazide (HYDRODIURIL) 25 MG tablet TAKE 1 TABLET BY MOUTH  DAILY 90 tablet 0  . Multiple Vitamin (MULTIVITAMIN) tablet Take 1 tablet by mouth daily.    Marland Kitchen olmesartan (BENICAR) 40 MG tablet TAKE 1 TABLET BY MOUTH  DAILY 90 tablet 0   No facility-administered medications prior to visit.     Review of Systems   Patient denies headache, fevers, malaise, unintentional weight loss, skin rash, eye pain, sinus congestion and sinus pain, sore throat, dysphagia,  hemoptysis , cough, dyspnea, wheezing, chest pain, palpitations, orthopnea, edema, abdominal pain, nausea, melena, diarrhea, constipation, flank pain, dysuria, hematuria, urinary  Frequency, nocturia, numbness, tingling, seizures,  Focal weakness, Loss of consciousness,  Tremor, insomnia, depression, anxiety, and suicidal ideation.     Objective:  BP 118/80   Pulse 67   Temp (!) 97.1 F (36.2 C) (Temporal)   Ht 5\' 5"  (1.651 m)   Wt 162 lb 6.4 oz (73.7 kg)   SpO2 97%   BMI 27.02 kg/m   Physical Exam   General appearance: alert, cooperative and appears stated age Head: Normocephalic, without obvious abnormality, atraumatic Eyes: conjunctivae/corneas clear. PERRL, EOM's intact. Fundi benign. Ears: normal TM's and external ear canals both ears Nose: Nares normal. Septum midline. Mucosa normal. No drainage or sinus tenderness. Throat: lips, mucosa, and tongue normal; teeth and gums normal Neck: no adenopathy, no carotid bruit, no JVD, supple, symmetrical, trachea midline and thyroid not enlarged, symmetric, no tenderness/mass/nodules Lungs: clear to auscultation bilaterally Breasts: normal appearance, no masses or  tenderness Heart: regular rate and rhythm, S1, S2 normal, no murmur, click, rub or gallop Abdomen: soft, non-tender; bowel sounds normal; no masses,  no organomegaly Extremities: extremities normal, atraumatic, no cyanosis or edema.  Bilateral foot deformities noted with surgical scars and excessive callous formation  Pulses: 2+ and symmetric Skin: Skin color, texture, turgor normal. No rashes or lesions Neurologic: Alert and oriented X 3, normal strength and tone. Normal symmetric reflexes. Normal coordination and gait.      Assessment & Plan:   Problem List Items Addressed This Visit      Unprioritized   Major depressive disorder, recurrent episode ([place])    Recurrent, managed with prozac.  Patient prefers to remain on current medication given recurrence of symptoms with prior  weaning trials. Refills given         Hyperlipidemia - Primary    She is Statin intolerant due to myalgias.  Her LDL remains closed to goal  with quercetin  Lab Results  Component Value Date   CHOL 224 (H) 01/16/2019   HDL 66 01/16/2019   LDLCALC 138 (H) 01/16/2019   LDLDIRECT 130 (H) 12/16/2015   TRIG 113 01/16/2019   CHOLHDL 3.4 01/16/2019         Relevant Orders   Lipid panel (Completed)   Encounter for preventive health examination    age appropriate education and counseling updated, referrals  for preventative services and immunizations addressed, dietary and smoking counseling addressed, most recent labs reviewed.  I have personally reviewed and have noted:  1) the patient's medical and social history 2) The pt's use of alcohol, tobacco, and illicit drugs 3) The patient's current medications and supplements 4) Functional ability including ADL's, fall risk, home safety risk, hearing and visual impairment 5) Diet and physical activities 6) Evidence for depression or mood disorder 7) The patient's height, weight, and BMI have been recorded in the chart  I have made referrals, and provided  counseling and education based on review of the above      Hypertension   Relevant Orders   Comprehensive metabolic panel (Completed)    Other Visit Diagnoses    Memory difficulties       Fatigue, unspecified type       Relevant Orders   CBC with Differential/Platelet (Completed)   TSH (Completed)   Encounter for breast cancer screening other than mammogram       Relevant Orders   3d mammogram [facility]      I am having Shirlyn Goltz. Albino "Gerald Stabs" start on mometasone. I am also having her maintain her cetirizine, multivitamin, esomeprazole, famotidine, doxycycline, FLUoxetine, hydrochlorothiazide, and olmesartan.  Meds ordered this encounter  Medications  . mometasone (ELOCON) 0.1 % cream    Sig: Apply 1 application topically daily.    Dispense:  45 g    Refill:  0    There are no discontinued medications.  Follow-up: No follow-ups on file.   Crecencio Mc, MD

## 2019-01-17 LAB — CBC WITH DIFFERENTIAL/PLATELET
Basophils Absolute: 0.1 10*3/uL (ref 0.0–0.2)
Basos: 1 %
EOS (ABSOLUTE): 0.1 10*3/uL (ref 0.0–0.4)
Eos: 2 %
Hematocrit: 38.6 % (ref 34.0–46.6)
Hemoglobin: 13.5 g/dL (ref 11.1–15.9)
Immature Grans (Abs): 0 10*3/uL (ref 0.0–0.1)
Immature Granulocytes: 0 %
Lymphocytes Absolute: 2.2 10*3/uL (ref 0.7–3.1)
Lymphs: 35 %
MCH: 32.3 pg (ref 26.6–33.0)
MCHC: 35 g/dL (ref 31.5–35.7)
MCV: 92 fL (ref 79–97)
Monocytes Absolute: 0.5 10*3/uL (ref 0.1–0.9)
Monocytes: 7 %
Neutrophils Absolute: 3.5 10*3/uL (ref 1.4–7.0)
Neutrophils: 55 %
Platelets: 248 10*3/uL (ref 150–450)
RBC: 4.18 x10E6/uL (ref 3.77–5.28)
RDW: 12.4 % (ref 11.7–15.4)
WBC: 6.4 10*3/uL (ref 3.4–10.8)

## 2019-01-17 LAB — COMPREHENSIVE METABOLIC PANEL
ALT: 17 IU/L (ref 0–32)
AST: 17 IU/L (ref 0–40)
Albumin/Globulin Ratio: 2.2 (ref 1.2–2.2)
Albumin: 4.4 g/dL (ref 3.7–4.7)
Alkaline Phosphatase: 53 IU/L (ref 39–117)
BUN/Creatinine Ratio: 15 (ref 12–28)
BUN: 16 mg/dL (ref 8–27)
Bilirubin Total: 0.4 mg/dL (ref 0.0–1.2)
CO2: 27 mmol/L (ref 20–29)
Calcium: 9.6 mg/dL (ref 8.7–10.3)
Chloride: 99 mmol/L (ref 96–106)
Creatinine, Ser: 1.04 mg/dL — ABNORMAL HIGH (ref 0.57–1.00)
GFR calc Af Amer: 62 mL/min/{1.73_m2} (ref >59)
GFR calc non Af Amer: 54 mL/min/{1.73_m2} — ABNORMAL LOW (ref >59)
Globulin, Total: 2 g/dL (ref 1.5–4.5)
Glucose: 93 mg/dL (ref 65–99)
Potassium: 4.3 mmol/L (ref 3.5–5.2)
Sodium: 137 mmol/L (ref 134–144)
Total Protein: 6.4 g/dL (ref 6.0–8.5)

## 2019-01-17 LAB — TSH: TSH: 1.82 u[IU]/mL (ref 0.450–4.500)

## 2019-01-17 LAB — LIPID PANEL
Chol/HDL Ratio: 3.4 ratio (ref 0.0–4.4)
Cholesterol, Total: 224 mg/dL — ABNORMAL HIGH (ref 100–199)
HDL: 66 mg/dL (ref >39)
LDL Chol Calc (NIH): 138 mg/dL — ABNORMAL HIGH (ref 0–99)
Triglycerides: 113 mg/dL (ref 0–149)
VLDL Cholesterol Cal: 20 mg/dL (ref 5–40)

## 2019-01-18 NOTE — Assessment & Plan Note (Signed)
Recurrent, managed with prozac.  Patient prefers to remain on current medication given recurrence of symptoms with prior  weaning trials. Refills given

## 2019-01-18 NOTE — Assessment & Plan Note (Signed)

## 2019-01-18 NOTE — Assessment & Plan Note (Signed)
She is Statin intolerant due to myalgias.  Her LDL remains closed to goal  with quercetin  Lab Results  Component Value Date   CHOL 224 (H) 01/16/2019   HDL 66 01/16/2019   LDLCALC 138 (H) 01/16/2019   LDLDIRECT 130 (H) 12/16/2015   TRIG 113 01/16/2019   CHOLHDL 3.4 01/16/2019

## 2019-01-24 ENCOUNTER — Other Ambulatory Visit: Payer: Self-pay

## 2019-01-24 ENCOUNTER — Other Ambulatory Visit (INDEPENDENT_AMBULATORY_CARE_PROVIDER_SITE_OTHER): Payer: Managed Care, Other (non HMO)

## 2019-01-24 DIAGNOSIS — R944 Abnormal results of kidney function studies: Secondary | ICD-10-CM

## 2019-01-24 NOTE — Addendum Note (Signed)
Addended by: Leeanne Rio on: 01/24/2019 02:51 PM   Modules accepted: Orders

## 2019-01-25 LAB — BASIC METABOLIC PANEL
BUN/Creatinine Ratio: 12 (ref 12–28)
BUN: 14 mg/dL (ref 8–27)
CO2: 25 mmol/L (ref 20–29)
Calcium: 9.2 mg/dL (ref 8.7–10.3)
Chloride: 103 mmol/L (ref 96–106)
Creatinine, Ser: 1.15 mg/dL — ABNORMAL HIGH (ref 0.57–1.00)
GFR calc Af Amer: 55 mL/min/{1.73_m2} — ABNORMAL LOW (ref >59)
GFR calc non Af Amer: 48 mL/min/{1.73_m2} — ABNORMAL LOW (ref >59)
Glucose: 101 mg/dL — ABNORMAL HIGH (ref 65–99)
Potassium: 4 mmol/L (ref 3.5–5.2)
Sodium: 140 mmol/L (ref 134–144)

## 2019-01-26 ENCOUNTER — Telehealth: Payer: Self-pay | Admitting: Internal Medicine

## 2019-01-26 DIAGNOSIS — N1831 Chronic kidney disease, stage 3a: Secondary | ICD-10-CM

## 2019-01-26 NOTE — Telephone Encounter (Signed)
My Chart message sent

## 2019-01-28 NOTE — Addendum Note (Signed)
Addended by: Crecencio Mc on: 01/28/2019 12:49 PM   Modules accepted: Orders

## 2019-01-31 NOTE — Telephone Encounter (Signed)
Requested report on 01/27/19

## 2019-02-09 ENCOUNTER — Other Ambulatory Visit: Payer: Self-pay | Admitting: Internal Medicine

## 2019-03-21 ENCOUNTER — Encounter: Payer: Self-pay | Admitting: Internal Medicine

## 2019-04-06 ENCOUNTER — Other Ambulatory Visit: Payer: Self-pay | Admitting: Internal Medicine

## 2019-07-22 ENCOUNTER — Other Ambulatory Visit: Payer: Self-pay

## 2019-07-24 ENCOUNTER — Other Ambulatory Visit: Payer: Self-pay

## 2019-07-24 ENCOUNTER — Encounter: Payer: Self-pay | Admitting: Internal Medicine

## 2019-07-24 ENCOUNTER — Ambulatory Visit: Payer: Managed Care, Other (non HMO) | Admitting: Internal Medicine

## 2019-07-24 VITALS — BP 118/82 | HR 83 | Temp 98.0°F | Resp 14 | Ht 65.0 in | Wt 160.0 lb

## 2019-07-24 DIAGNOSIS — L02211 Cutaneous abscess of abdominal wall: Secondary | ICD-10-CM | POA: Insufficient documentation

## 2019-07-24 DIAGNOSIS — N1831 Chronic kidney disease, stage 3a: Secondary | ICD-10-CM | POA: Diagnosis not present

## 2019-07-24 DIAGNOSIS — N6082 Other benign mammary dysplasias of left breast: Secondary | ICD-10-CM | POA: Diagnosis not present

## 2019-07-24 DIAGNOSIS — E7800 Pure hypercholesterolemia, unspecified: Secondary | ICD-10-CM | POA: Diagnosis not present

## 2019-07-24 DIAGNOSIS — I10 Essential (primary) hypertension: Secondary | ICD-10-CM

## 2019-07-24 MED ORDER — FUROSEMIDE 20 MG PO TABS: 20.0000 mg | ORAL_TABLET | Freq: Every day | ORAL | 0 refills | Status: DC

## 2019-07-24 MED ORDER — CEPHALEXIN 500 MG PO CAPS: 500.0000 mg | ORAL_CAPSULE | Freq: Four times a day (QID) | ORAL | 0 refills | Status: DC

## 2019-07-24 NOTE — Patient Instructions (Signed)
You can use furosemide AS NEEDED for severe fluid retention  Take the cephalexin 3-4 times daily with food for one week   Daily use of Probiotics for  3 weeks advised to reduce risk of C dificile colitis.    Dr Job Founds will see you at [facility] at 11:45 to drain the abscess on your abdominal wall  [address] (2nd floor of the building that houses [facility] )

## 2019-07-24 NOTE — Assessment & Plan Note (Signed)
Became tender and red one week ago,  Enlarging .  Started draining on Monday. No fevers or chill.  Referral to [facility] for I & D

## 2019-07-24 NOTE — Addendum Note (Signed)
Addended by: Leeanne Rio on: 07/24/2019 08:56 AM   Modules accepted: Orders

## 2019-07-24 NOTE — Assessment & Plan Note (Signed)
Improved control on olmesartan and amlodipine.  htz stopped due to hyponatremia which had improved post suspension.  Has days of severe fluid retention ,  Prn lasix prescribed

## 2019-07-24 NOTE — Assessment & Plan Note (Addendum)
Present for nearly 2 years,  Now enlarged and infected and draining.  Cephalexin  surgial consult for I & D

## 2019-07-24 NOTE — Progress Notes (Signed)
Subjective:  Patient ID: Grace [name], female    DOB: [birth year]  Age: 74 y.o. MRN: [id number]  CC: The primary encounter diagnosis was Stage 3a chronic kidney disease. Diagnoses of Pure hypercholesterolemia, Cutaneous abscess of abdominal wall, Sebaceous cyst of breast, left, and Essential hypertension were also pertinent to this visit.  HPI SOLIA FAHL presents for 6 month follow up on chronic conditions.  This visit occurred during the SARS-CoV-2 public health emergency.  Safety protocols were in place, including screening questions prior to the visit, additional usage of staff PPE, and extensive cleaning of exam room while observing appropriate contact time as indicated for disinfecting solutions.    Patient has received NO  doses of the available COVID 19 vaccines.   Patient continues to mask when outside of the home except when walking in yard or at safe distances from others .  Patient denies any change in mood or development of unhealthy behaviors resuting from the pandemic's restriction of activities and socialization.  Patient is taking her medications as prescribed and notes no adverse effects. Medication change by nephrologist in December due to hyponatremiat:  hctz stopped and amlodipine started  Home BP readings have been done twice per week and are usually < 130/80 .  She is avoiding added salt in her diet and walking regularly about 3 times per week for exercise  .    Dec 2020 labs per nephrology  Na 133 K  5.2   Cl 98 co2 30   cff 0.86   Bun 12  Glu 63  Ca 9.3 gfr 87    ABD WALL ABSCESS STARTED DRAINING  3 DAYS AGO,  BUT HAS been enlarging for the past week.  warmth and redness no fevers . Present for 2 years as sebacous cyst  Now clearly infected     Outpatient Medications Prior to Visit  Medication Sig Dispense Refill  . cetirizine (ZYRTEC) 5 MG tablet Take 5 mg by mouth daily.    Marland Kitchen esomeprazole (NEXIUM) 20 MG capsule Take 20 mg by mouth daily at 12 noon.    .  famotidine (PEPCID) 20 MG tablet TAKE 1 TABLET BY MOUTH TWO  TIMES DAILY 180 tablet 1  . FLUoxetine (PROZAC) 40 MG capsule TAKE 1 CAPSULE BY MOUTH  DAILY 90 capsule 3  . mometasone (ELOCON) 0.1 % cream Apply 1 application topically daily. 45 g 0  . Multiple Vitamin (MULTIVITAMIN) tablet Take 1 tablet by mouth daily.    Marland Kitchen olmesartan (BENICAR) 40 MG tablet TAKE 1 TABLET BY MOUTH  DAILY 90 tablet 3  . amLODipine (NORVASC) 2.5 MG tablet Take 2.5 mg by mouth daily.    Marland Kitchen doxycycline (VIBRA-TABS) 100 MG tablet Take 1 tablet (100 mg total) by mouth 2 (two) times daily. (Patient not taking: Reported on 07/24/2019) 20 tablet 0  . hydrochlorothiazide (HYDRODIURIL) 25 MG tablet TAKE 1 TABLET BY MOUTH  DAILY (Patient not taking: Reported on 07/24/2019) 90 tablet 3   No facility-administered medications prior to visit.    Review of Systems;  Patient denies headache, fevers, malaise, unintentional weight loss, skin rash, eye pain, sinus congestion and sinus pain, sore throat, dysphagia,  hemoptysis , cough, dyspnea, wheezing, chest pain, palpitations, orthopnea, edema, abdominal pain, nausea, melena, diarrhea, constipation, flank pain, dysuria, hematuria, urinary  Frequency, nocturia, numbness, tingling, seizures,  Focal weakness, Loss of consciousness,  Tremor, insomnia, depression, anxiety, and suicidal ideation.      Objective:  BP 118/82 (BP Location: Left Arm, Patient Position:  Sitting, Cuff Size: Normal)   Pulse 83   Temp 98 F (36.7 C) (Temporal)   Resp 14   Ht 5\' 5"  (1.651 m)   Wt 160 lb (72.6 kg)   SpO2 99%   BMI 26.63 kg/m   BP Readings from Last 3 Encounters:  07/24/19 118/82  01/16/19 118/80  01/16/18 118/86    Wt Readings from Last 3 Encounters:  07/24/19 160 lb (72.6 kg)  01/16/19 162 lb 6.4 oz (73.7 kg)  01/16/18 158 lb 9.6 oz (71.9 kg)    General appearance: alert, cooperative and appears stated age Ears: normal TM's and external ear canals both ears Throat: lips, mucosa,  and tongue normal; teeth and gums normal Neck: no adenopathy, no carotid bruit, supple, symmetrical, trachea midline and thyroid not enlarged, symmetric, no tenderness/mass/nodules Back: symmetric, no curvature. ROM normal. No CVA tenderness. Lungs: clear to auscultation bilaterally Heart: regular rate and rhythm, S1, S2 normal, no murmur, click, rub or gallop Abdomen: fluctant indurated cutaneous abscess under left breast.  Draining pus Pulses: 2+ and symmetric Skin: Skin color, texture, turgor normal. No rashes or lesions Lymph nodes: Cervical, supraclavicular, and axillary nodes normal.  No results found for: HGBA1C  Lab Results  Component Value Date   CREATININE 1.15 (H) 01/24/2019   CREATININE 1.04 (H) 01/16/2019   CREATININE 0.85 01/16/2018    Lab Results  Component Value Date   WBC 6.4 01/16/2019   HGB 13.5 01/16/2019   HCT 38.6 01/16/2019   PLT 248 01/16/2019   GLUCOSE 101 (H) 01/24/2019   CHOL 224 (H) 01/16/2019   TRIG 113 01/16/2019   HDL 66 01/16/2019   LDLDIRECT 130 (H) 12/16/2015   LDLCALC 138 (H) 01/16/2019   ALT 17 01/16/2019   AST 17 01/16/2019   NA 140 01/24/2019   K 4.0 01/24/2019   CL 103 01/24/2019   CREATININE 1.15 (H) 01/24/2019   BUN 14 01/24/2019   CO2 25 01/24/2019   TSH 1.820 01/16/2019    No results found.  Assessment & Plan:   Problem List Items Addressed This Visit      Unprioritized   Cutaneous abscess of abdominal wall    Became tender and red one week ago,  Enlarging .  Started draining on Monday. No fevers or chill.  Referral to [facility] for I & D       Relevant Orders   Ambulatory referral to General Surgery   CBC with Differential/Platelet   Hyperlipidemia   Relevant Medications   amLODipine (NORVASC) 2.5 MG tablet   furosemide (LASIX) 20 MG tablet   Other Relevant Orders   Lipid panel   Hepatic function panel   Hypertension    Improved control on olmesartan and amlodipine.  htz stopped due to hyponatremia  which had improved post suspension.  Has days of severe fluid retention ,  Prn lasix prescribed       Relevant Medications   amLODipine (NORVASC) 2.5 MG tablet   furosemide (LASIX) 20 MG tablet   Sebaceous cyst of breast, left    Present for nearly 2 years,  Now enlarged and infected and draining.  Cephalexin  surgial consult for I & D       Other Visit Diagnoses    Stage 3a chronic kidney disease    -  Primary   Relevant Orders   Renal function panel      I have discontinued Lorel Noll. Dieudonne "Chris"'s doxycycline and hydrochlorothiazide. I am also having her start on cephALEXin  and furosemide. Additionally, I am having her maintain her cetirizine, multivitamin, esomeprazole, famotidine, mometasone, olmesartan, FLUoxetine, and amLODipine.  Meds ordered this encounter  Medications  . cephALEXin (KEFLEX) 500 MG capsule    Sig: Take 1 capsule (500 mg total) by mouth 4 (four) times daily.    Dispense:  28 capsule    Refill:  0  . furosemide (LASIX) 20 MG tablet    Sig: Take 1 tablet (20 mg total) by mouth daily. As needed for fluid retention    Dispense:  30 tablet    Refill:  0    Medications Discontinued During This Encounter  Medication Reason  . hydrochlorothiazide (HYDRODIURIL) 25 MG tablet Discontinued by provider  . doxycycline (VIBRA-TABS) 100 MG tablet Completed Course   I provided  30 minutes of  face-to-face time during this encounter reviewing patient's current problems and past surgeries, labs and imaging studies, providing counseling on the above mentioned problems , and coordination  of care . Follow-up: No follow-ups on file.   Crecencio Mc, MD

## 2019-07-25 LAB — RENAL FUNCTION PANEL
Albumin: 4.3 g/dL (ref 3.7–4.7)
BUN/Creatinine Ratio: 14 (ref 12–28)
BUN: 13 mg/dL (ref 8–27)
CO2: 25 mmol/L (ref 20–29)
Calcium: 9.3 mg/dL (ref 8.7–10.3)
Chloride: 102 mmol/L (ref 96–106)
Creatinine, Ser: 0.94 mg/dL (ref 0.57–1.00)
GFR calc Af Amer: 70 mL/min/{1.73_m2} (ref >59)
GFR calc non Af Amer: 61 mL/min/{1.73_m2} (ref >59)
Glucose: 88 mg/dL (ref 65–99)
Phosphorus: 3.9 mg/dL (ref 3.0–4.3)
Potassium: 4.5 mmol/L (ref 3.5–5.2)
Sodium: 142 mmol/L (ref 134–144)

## 2019-07-25 LAB — CBC WITH DIFFERENTIAL/PLATELET
Basophils Absolute: 0.1 10*3/uL (ref 0.0–0.2)
Basos: 1 %
EOS (ABSOLUTE): 0.1 10*3/uL (ref 0.0–0.4)
Eos: 2 %
Hematocrit: 39 % (ref 34.0–46.6)
Hemoglobin: 12.9 g/dL (ref 11.1–15.9)
Immature Grans (Abs): 0 10*3/uL (ref 0.0–0.1)
Immature Granulocytes: 0 %
Lymphocytes Absolute: 2.1 10*3/uL (ref 0.7–3.1)
Lymphs: 35 %
MCH: 31.2 pg (ref 26.6–33.0)
MCHC: 33.1 g/dL (ref 31.5–35.7)
MCV: 94 fL (ref 79–97)
Monocytes Absolute: 0.4 10*3/uL (ref 0.1–0.9)
Monocytes: 7 %
Neutrophils Absolute: 3.4 10*3/uL (ref 1.4–7.0)
Neutrophils: 55 %
Platelets: 261 10*3/uL (ref 150–450)
RBC: 4.14 x10E6/uL (ref 3.77–5.28)
RDW: 12.4 % (ref 11.7–15.4)
WBC: 6.1 10*3/uL (ref 3.4–10.8)

## 2019-07-25 LAB — HEPATIC FUNCTION PANEL
ALT: 12 IU/L (ref 0–32)
AST: 15 IU/L (ref 0–40)
Alkaline Phosphatase: 59 IU/L (ref 39–117)
Bilirubin Total: 0.4 mg/dL (ref 0.0–1.2)
Bilirubin, Direct: 0.09 mg/dL (ref 0.00–0.40)
Total Protein: 6.3 g/dL (ref 6.0–8.5)

## 2019-07-25 LAB — LIPID PANEL
Chol/HDL Ratio: 3.8 ratio (ref 0.0–4.4)
Cholesterol, Total: 219 mg/dL — ABNORMAL HIGH (ref 100–199)
HDL: 58 mg/dL (ref >39)
LDL Chol Calc (NIH): 144 mg/dL — ABNORMAL HIGH (ref 0–99)
Triglycerides: 98 mg/dL (ref 0–149)
VLDL Cholesterol Cal: 17 mg/dL (ref 5–40)

## 2019-08-13 ENCOUNTER — Encounter: Payer: Self-pay | Admitting: Internal Medicine

## 2019-08-13 ENCOUNTER — Telehealth (INDEPENDENT_AMBULATORY_CARE_PROVIDER_SITE_OTHER): Payer: Managed Care, Other (non HMO) | Admitting: Internal Medicine

## 2019-08-13 DIAGNOSIS — L03116 Cellulitis of left lower limb: Secondary | ICD-10-CM | POA: Diagnosis not present

## 2019-08-13 MED ORDER — CEPHALEXIN 500 MG PO CAPS: 500.0000 mg | ORAL_CAPSULE | Freq: Four times a day (QID) | ORAL | 0 refills | Status: DC

## 2019-08-13 MED ORDER — DOXYCYCLINE HYCLATE 100 MG PO TABS: 100.0000 mg | ORAL_TABLET | Freq: Two times a day (BID) | ORAL | 0 refills | Status: DC

## 2019-08-13 NOTE — Progress Notes (Signed)
Virtual Visit via [employer]  This visit type was conducted due to national recommendations for restrictions regarding the COVID-19 pandemic (e.g. social distancing).  This format is felt to be most appropriate for this patient at this time.  All issues noted in this document were discussed and addressed.  No physical exam was performed (except for noted visual exam findings with Video Visits).   I connected with@ on 08/13/19 at 11:30 AM EDT by a video enabled telemedicine application  and verified that I am speaking with the correct person using two identifiers. Location patient: home Location provider: work or home office Persons participating in the virtual visit: patient, provider  I discussed the limitations, risks, security and privacy concerns of performing an evaluation and management service by telephone and the availability of in person appointments. I also discussed with the patient that there may be a patient responsible charge related to this service. The patient expressed understanding and agreed to proceed.  Reason for visit:  NEW ONSET RASH LOWER LEG  HPI:  74 YR OLD  Female presents with  New onset  pruritic papular rash that started  On Sunday morning. Saturday had symptoms of chills and malaise.  Woke up Sunday feeling much better but noticed new red papules on calf,  Tender .  Has not spread since Sunday . No fevers or headaches.    ROS: See pertinent positives and negatives per HPI.  Past Medical History:  Diagnosis Date  . allergic rhinitis   . Benign heart murmur   . Depression    resolved  . GERD (gastroesophageal reflux disease)   . Hx of colonic polyps   . Hyperlipidemia   . Hypertension     Past Surgical History:  Procedure Laterality Date  . ABDOMINAL HYSTERECTOMY    . TOTAL ABDOMINAL HYSTERECTOMY W/ BILATERAL SALPINGOOPHORECTOMY  1991   excessive bleeding    Family History  Problem Relation Age of Onset  . Mental illness Mother 43       alzheimers  dementia  . Cancer Father 81       prostate  . Heart disease Father        pacemaker,  no AMI    SOCIAL HX[place]   reports that she has never smoked. She has never used smokeless tobacco. She reports that she does not drink alcohol or use drugs.   Current Outpatient Medications:  .  amLODipine (NORVASC) 2.5 MG tablet, Take 2.5 mg by mouth daily., Disp: , Rfl:  .  cetirizine (ZYRTEC) 5 MG tablet, Take 5 mg by mouth daily., Disp: , Rfl:  .  esomeprazole (NEXIUM) 20 MG capsule, Take 20 mg by mouth daily at 12 noon., Disp: , Rfl:  .  famotidine (PEPCID) 20 MG tablet, TAKE 1 TABLET BY MOUTH TWO  TIMES DAILY, Disp: 180 tablet, Rfl: 1 .  FLUoxetine (PROZAC) 40 MG capsule, TAKE 1 CAPSULE BY MOUTH  DAILY, Disp: 90 capsule, Rfl: 3 .  furosemide (LASIX) 20 MG tablet, Take 1 tablet (20 mg total) by mouth daily. As needed for fluid retention, Disp: 30 tablet, Rfl: 0 .  Multiple Vitamin (MULTIVITAMIN) tablet, Take 1 tablet by mouth daily., Disp: , Rfl:  .  olmesartan (BENICAR) 40 MG tablet, TAKE 1 TABLET BY MOUTH  DAILY, Disp: 90 tablet, Rfl: 3 .  cephALEXin (KEFLEX) 500 MG capsule, Take 1 capsule (500 mg total) by mouth 4 (four) times daily., Disp: 28 capsule, Rfl: 0 .  doxycycline (VIBRA-TABS) 100  MG tablet, Take 1 tablet (100 mg total) by mouth 2 (two) times daily., Disp: 20 tablet, Rfl: 0 .  mometasone (ELOCON) 0.1 % cream, Apply 1 application topically daily. (Patient not taking: Reported on 08/13/2019), Disp: 45 g, Rfl: 0  EXAM:  VITALS per patient if applicable:  GENERAL: alert, oriented, appears well and in no acute distress  HEENT: atraumatic, conjunttiva clear, no obvious abnormalities on inspection of external nose and ears  NECK: normal movements of the head and neck  LUNGS: on inspection no signs of respiratory distress, breathing rate appears normal, no obvious gross SOB, gasping or wheezing  CV: no obvious cyanosis  MS: moves all visible extremities without  noticeable abnormality  ExtL right leg with discrete papules,  Red,  Confined to calf/shin,   PSYCH/NEURO: pleasant and cooperative, no obvious depression or anxiety, speech and thought processing grossly intact  ASSESSMENT AND PLAN:  Discussed the following assessment and plan:  Cellulitis of leg without foot, left  Cellulitis of leg without foot, left Treating with cephalexin given absence of tick borne illness sigsn or symptoms and papules suggestive of flea bites.  Probiotic advised. Add or change to doxycycline if tick is found or if she develops fever and headache.    I discussed the assessment and treatment plan with the patient. The patient was provided an opportunity to ask questions and all were answered. The patient agreed with the plan and demonstrated an understanding of the instructions.   The patient was advised to call back or seek an in-person evaluation if the symptoms worsen or if the condition fails to improve as anticipated.  I provided  30 minutes of  face-to-face time during this encounter reviewing patient's current problems and past surgeries, labs and imaging studies, providing counseling on the above mentioned problems , and coordination  of care .  Crecencio Mc, MD

## 2019-08-13 NOTE — Progress Notes (Signed)
Saturday after lunch pt stated that she felt dizzy, got the chills and had a headache. Pt stated when she woke up Sunday morning and had a rash on her lower left leg. Pt stated that her the area the rash is located has a tingling sensation and is tender to the touch.

## 2019-08-13 NOTE — Patient Instructions (Addendum)
I am treating you for cellulitis that looks like it started with flea bites or some other insect  Start the cephalexin today and take it 4 times daily with food  Use stool softener, miralax or dulcolax for constipation  If you develop fever and a headache,  Switch antibiotics to doxycycline and LET ME KNOW, because we will need to check you in a few weeks for [facility] Spotted Fever    Stay on your probiotic for 2 more weeks

## 2019-08-13 NOTE — Assessment & Plan Note (Signed)
Treating with cephalexin given absence of tick borne illness sigsn or symptoms and papules suggestive of flea bites.  Probiotic advised. Add or change to doxycycline if tick is found or if she develops fever and headache.

## 2019-10-11 IMAGING — CT CT CERVICAL SPINE W/O CM
4 of 7 series · 14 of 33 positions shown, 15 images · non-contrast
Comparison: None.

CLINICAL DATA: Fall while mounting a horse.  Headache.

EXAM:
CT HEAD WITHOUT CONTRAST
CT CERVICAL SPINE WITHOUT CONTRAST
TECHNIQUE: Multidetector CT imaging of the head and cervical spine was
performed following the standard protocol without intravenous
contrast. Multiplanar CT image reconstructions of the cervical spine
were also generated.

[Series 7: c spine soft · axial · 0.27mm/px · z∈[-213,-143]mm · 3 of 89 slices shown]
[im 18/89  soft-tissue]
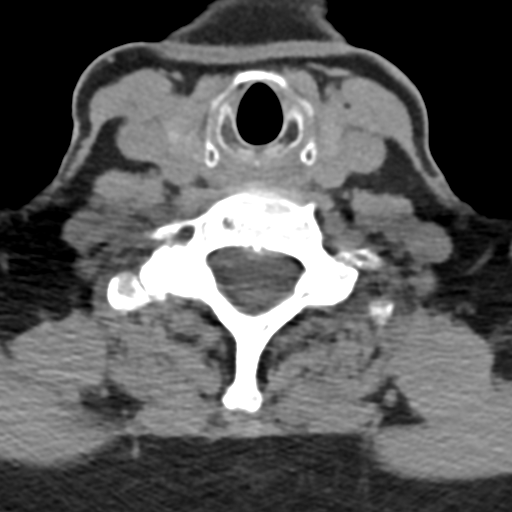
[im 36/89  soft-tissue]
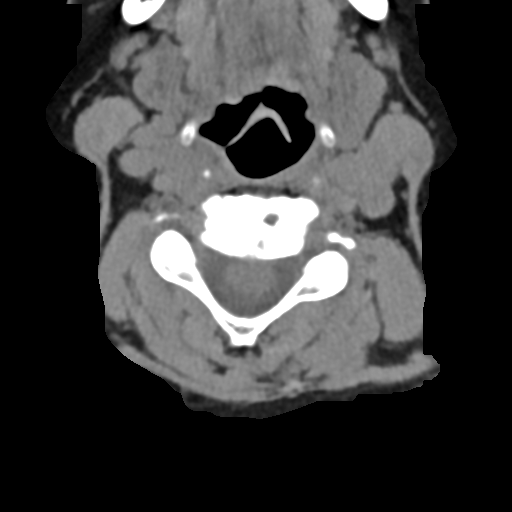
[im 53/89  soft-tissue]
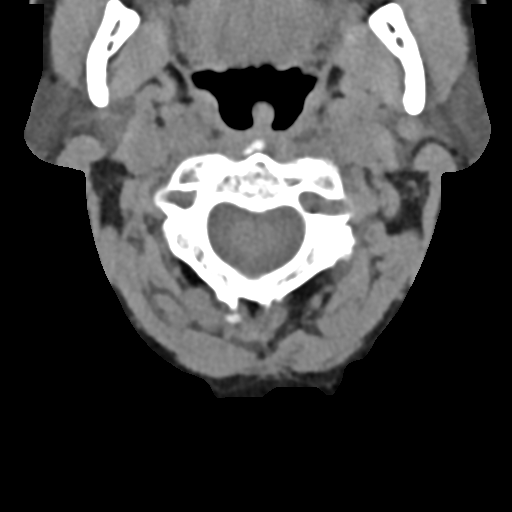

[Series 8: sagittal bone · sagittal · 0.25mm/px · 4 of 45 slices shown]
[im 9/45  bone]
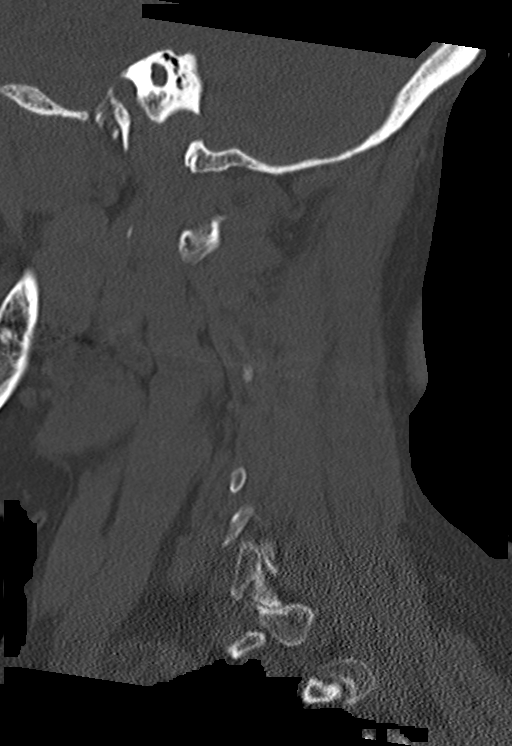
[im 18/45  bone]
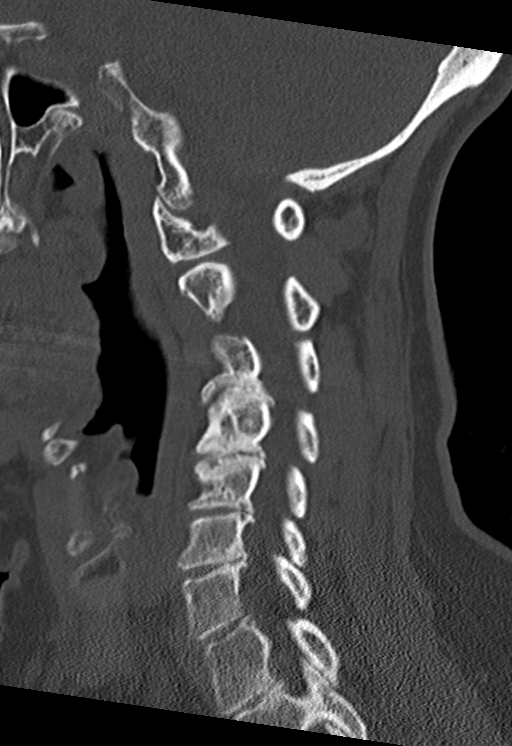
[im 27/45  bone]
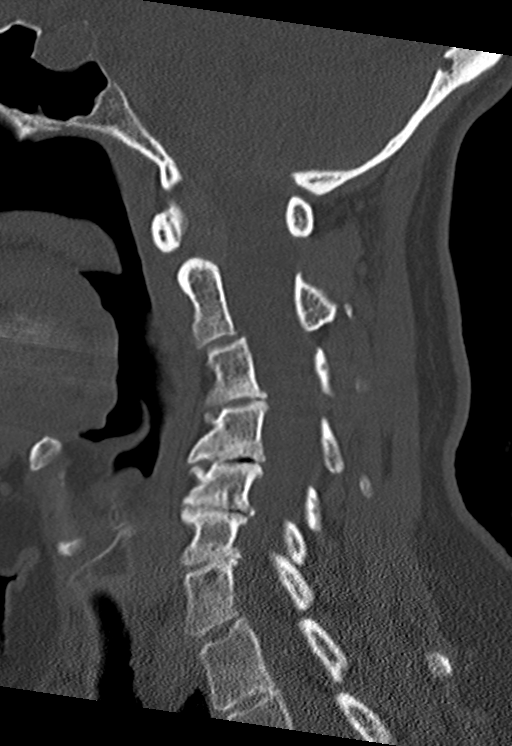
[im 36/45  bone]
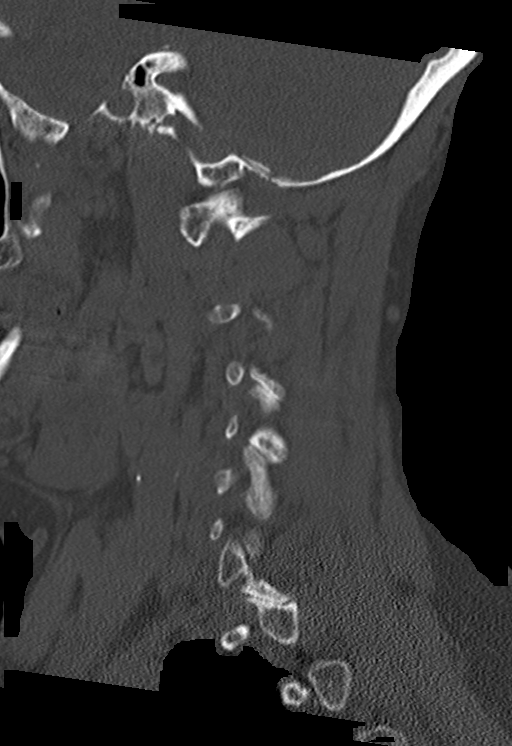

[Series 9: coronal bone · coronal · 0.21mm/px · 3 of 45 slices shown]
[im 12/45  bone]
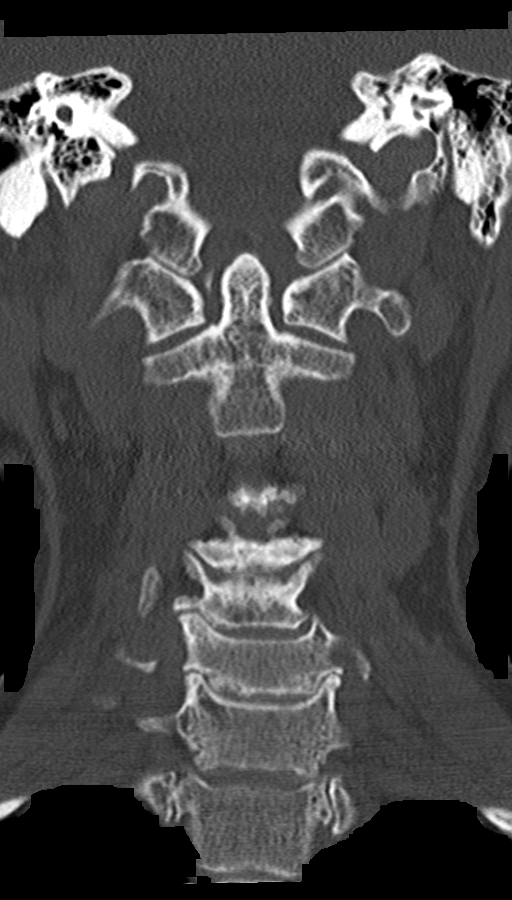
[im 23/45  bone]
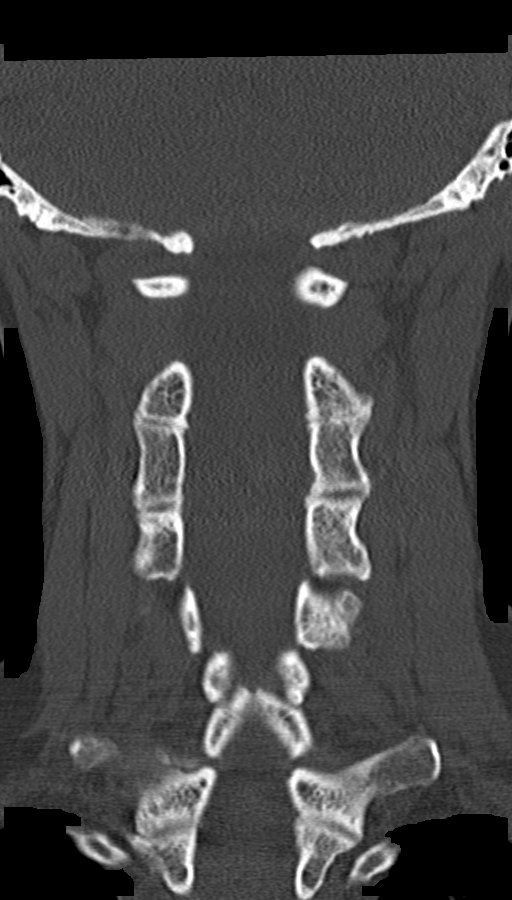
[im 34/45  bone]
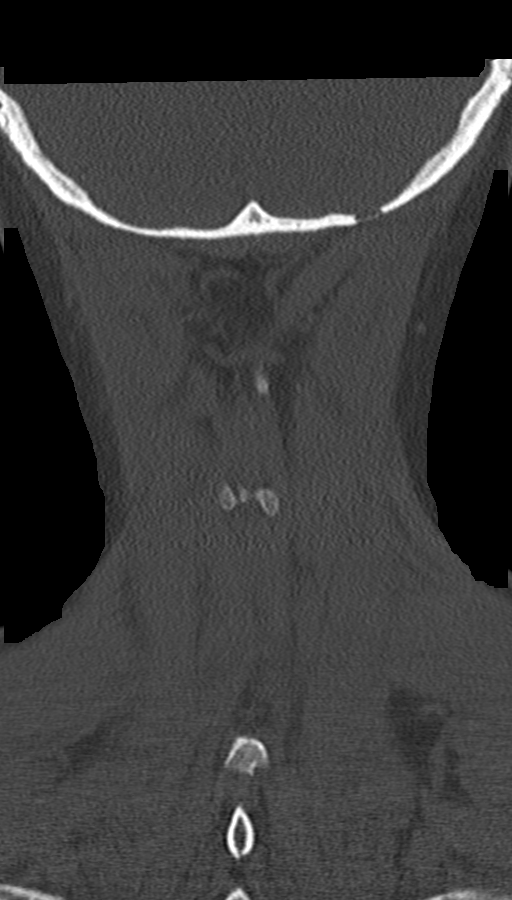

[Series 10: orthogonal bone · axial · 0.25mm/px · z∈[-226,-116]mm · 4 of 95 slices shown, 5 images]
[im 19/95  soft-tissue]
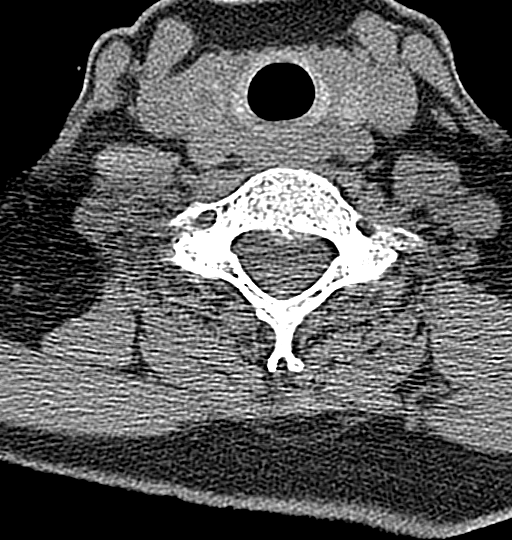
[im 19/95  bone]
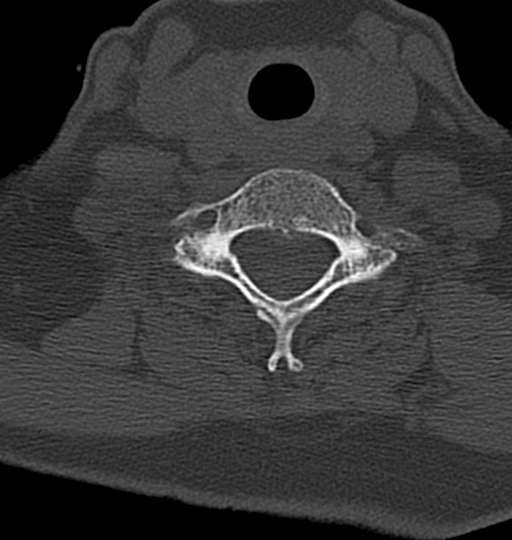
[im 38/95  bone]
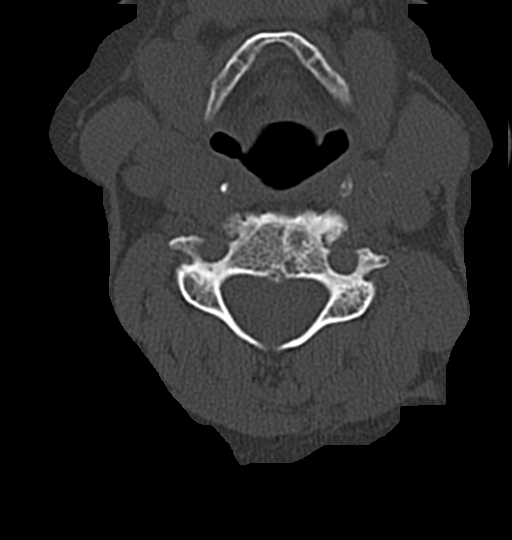
[im 57/95  bone]
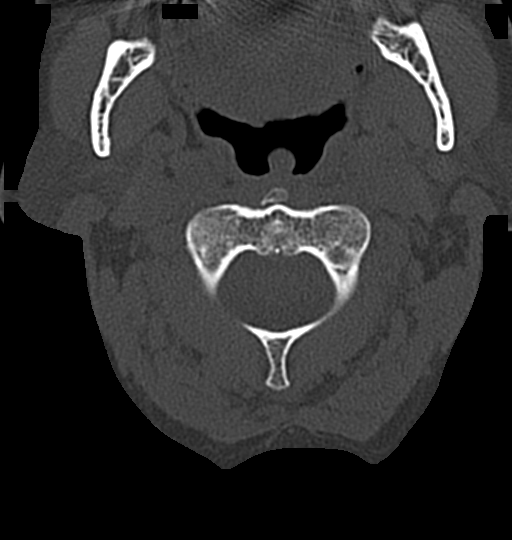
[im 76/95  bone]
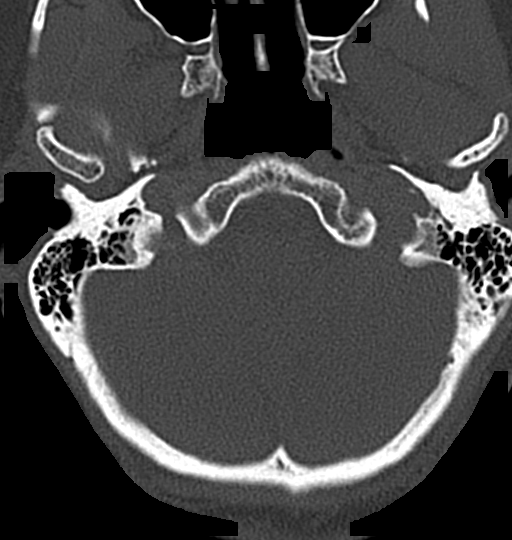

[14 of 33 positions shown; findings below may reference images not displayed]

FINDINGS: CT HEAD FINDINGS

Brain: No mass lesion, intraparenchymal hemorrhage or extra-axial
collection. No evidence of acute cortical infarct. Brain parenchyma
and CSF-containing spaces are normal for age.

Vascular: No hyperdense vessel or unexpected calcification.

Skull: Normal visualized skull base, calvarium and extracranial soft
tissues.

Sinuses/Orbits: No sinus fluid levels or advanced mucosal
thickening. No mastoid effusion. Normal orbits.

CT CERVICAL SPINE FINDINGS

Alignment: Reversal of normal cervical lordosis. Occipital condyles
and lateral masses of C1 and C2 are aligned. No facet widening.

Skull base and vertebrae: No acute fracture.

Soft tissues and spinal canal: No prevertebral fluid or swelling. No
visible canal hematoma.

Disc levels: Moderate right C5-6 foraminal stenosis.

Upper chest: No pneumothorax, pulmonary nodule or pleural effusion.

Other: Normal visualized paraspinal cervical soft tissues.
IMPRESSION: 1. Normal head CT.
2. No acute cervical spine fracture.
3. Reversal of the normal cervical lordosis, likely degenerative.

## 2019-11-06 NOTE — Telephone Encounter (Signed)
Pt wants a call back about the covid vaccine before she gets it tomorrow    Pt also said that she sent the wrong image

## 2020-01-20 ENCOUNTER — Ambulatory Visit (INDEPENDENT_AMBULATORY_CARE_PROVIDER_SITE_OTHER): Payer: Managed Care, Other (non HMO) | Admitting: Dermatology

## 2020-01-20 ENCOUNTER — Other Ambulatory Visit: Payer: Self-pay

## 2020-01-20 DIAGNOSIS — D2371 Other benign neoplasm of skin of right lower limb, including hip: Secondary | ICD-10-CM | POA: Diagnosis not present

## 2020-01-20 DIAGNOSIS — D2239 Melanocytic nevi of other parts of face: Secondary | ICD-10-CM | POA: Diagnosis not present

## 2020-01-20 DIAGNOSIS — I8393 Asymptomatic varicose veins of bilateral lower extremities: Secondary | ICD-10-CM

## 2020-01-20 DIAGNOSIS — Z1283 Encounter for screening for malignant neoplasm of skin: Secondary | ICD-10-CM | POA: Diagnosis not present

## 2020-01-20 DIAGNOSIS — L814 Other melanin hyperpigmentation: Secondary | ICD-10-CM

## 2020-01-20 DIAGNOSIS — D229 Melanocytic nevi, unspecified: Secondary | ICD-10-CM

## 2020-01-20 DIAGNOSIS — L820 Inflamed seborrheic keratosis: Secondary | ICD-10-CM | POA: Diagnosis not present

## 2020-01-20 DIAGNOSIS — D1800 Hemangioma unspecified site: Secondary | ICD-10-CM

## 2020-01-20 DIAGNOSIS — L821 Other seborrheic keratosis: Secondary | ICD-10-CM

## 2020-01-20 DIAGNOSIS — Z85828 Personal history of other malignant neoplasm of skin: Secondary | ICD-10-CM

## 2020-01-20 DIAGNOSIS — L578 Other skin changes due to chronic exposure to nonionizing radiation: Secondary | ICD-10-CM

## 2020-01-20 DIAGNOSIS — L918 Other hypertrophic disorders of the skin: Secondary | ICD-10-CM

## 2020-01-20 DIAGNOSIS — D239 Other benign neoplasm of skin, unspecified: Secondary | ICD-10-CM

## 2020-01-20 NOTE — Progress Notes (Signed)
Follow-Up Visit   Subjective  Grace [name] is a 74 y.o. female who presents for the following: Annual Exam (Patient with a history of BCC. She is not aware of anything new or changing today. ).  There are a few spots at neck that get irritated- get caught on clothing, jewelry.  Patient here for full body skin exam and skin cancer screening.   The following portions of the chart were reviewed this encounter and updated as appropriate:      Review of Systems:  No other skin or systemic complaints except as noted in HPI or Assessment and Plan.  Objective  Well appearing patient in no apparent distress; mood and affect are within normal limits.  A full examination was performed including scalp, head, eyes, ears, nose, lips, neck, chest, axillae, abdomen, back, buttocks, bilateral upper extremities, bilateral lower extremities, hands, feet, fingers, toes, fingernails, and toenails. All findings within normal limits unless otherwise noted below.  Objective  right lower neck x 4 (4): Erythematous keratotic or waxy stuck-on papule  Objective  Right Medial Knee: Firm pink/brown papulenodule with dimple sign.   Objective  Lower Legs: Small blue varicosities   Objective  right nasolabial: 30mm firm flesh papule    Assessment & Plan  Inflamed seborrheic keratosis (4) right lower neck x 4  Destruction of lesion - right lower neck x 4  Destruction method: cryotherapy   Informed consent: discussed and consent obtained   Lesion destroyed using liquid nitrogen: Yes   Region frozen until ice ball extended beyond lesion: Yes   Outcome: patient tolerated procedure well with no complications   Post-procedure details: wound care instructions given    Dermatofibroma Right Medial Knee  Benign, observe.    Spider veins of both lower extremities Lower Legs  Benign, observe.    Nevus right nasolabial  Vs Cyst  Benign-appearing.  Observation.  Call clinic for new or changing  lesions.  Recommend daily use of broad spectrum spf 30+ sunscreen to sun-exposed areas.     Acrochordons (Skin Tags) - Fleshy, skin-colored pedunculated papules - Benign appearing.  - Observe. - If desired, they can be removed with an in office procedure that is not covered by insurance. - Please call the clinic if you notice any new or changing lesions.  Lentigines - Scattered tan macules - Discussed due to sun exposure - Benign, observe - Call for any changes  Seborrheic Keratoses - Stuck-on, waxy, tan-brown papules and plaques  - Discussed benign etiology and prognosis. - Observe - Call for any changes  Melanocytic Nevi - Tan-brown and/or pink-flesh-colored symmetric macules and papules at face - Benign appearing on exam today - Observation - Call clinic for new or changing moles - Recommend daily use of broad spectrum spf 30+ sunscreen to sun-exposed areas.   Hemangiomas - Red papules - Discussed benign nature - Observe - Call for any changes  Actinic Damage - Chronic, secondary to cumulative UV/sun exposure - diffuse scaly erythematous macules with underlying dyspigmentation - Recommend daily broad spectrum sunscreen SPF 30+ to sun-exposed areas, reapply every 2 hours as needed.  - Call for new or changing lesions.  Skin cancer screening performed today.  History of Basal Cell Carcinoma of the Skin - No evidence of recurrence today at face - Recommend regular full body skin exams - Recommend daily broad spectrum sunscreen SPF 30+ to sun-exposed areas, reapply every 2 hours as needed.  - Call if any new or changing lesions are noted between office  visits   Return in about 1 year (around 01/19/2021) for TBSE.  Graciella Belton, RMA, am acting as scribe for Brendolyn Patty, MD . Documentation: I have reviewed the above documentation for accuracy and completeness, and I agree with the above.  Brendolyn Patty MD

## 2020-01-20 NOTE — Patient Instructions (Addendum)
Melanoma ABCDEs  Melanoma is the most dangerous type of skin cancer, and is the leading cause of death from skin disease.  You are more likely to develop melanoma if you:  Have light-colored skin, light-colored eyes, or red or blond hair  Spend a lot of time in the sun  Tan regularly, either outdoors or in a tanning bed  Have had blistering sunburns, especially during childhood  Have a close family member who has had a melanoma  Have atypical moles or large birthmarks  Early detection of melanoma is key since treatment is typically straightforward and cure rates are extremely high if we catch it early.   The first sign of melanoma is often a change in a mole or a new dark spot.  The ABCDE system is a way of remembering the signs of melanoma.  A for asymmetry:  The two halves do not match. B for border:  The edges of the growth are irregular. C for color:  A mixture of colors are present instead of an even brown color. D for diameter:  Melanomas are usually (but not always) greater than 6mm - the size of a pencil eraser. E for evolution:  The spot keeps changing in size, shape, and color.  Please check your skin once per month between visits. You can use a small mirror in front and a large mirror behind you to keep an eye on the back side or your body.   If you see any new or changing lesions before your next follow-up, please call to schedule a visit.  Please continue daily skin protection including broad spectrum sunscreen SPF 30+ to sun-exposed areas, reapplying every 2 hours as needed when you're outdoors.    Cryotherapy Aftercare  . Wash gently with soap and water everyday.   . Apply Vaseline and Band-Aid daily until healed.   Seborrheic Keratosis  What causes seborrheic keratoses? Seborrheic keratoses are harmless, common skin growths that first appear during adult life.  As time goes by, more growths appear.  Some people may develop a large number of them.  Seborrheic  keratoses appear on both covered and uncovered body parts.  They are not caused by sunlight.  The tendency to develop seborrheic keratoses can be inherited.  They vary in color from skin-colored to gray, brown, or even black.  They can be either smooth or have a rough, warty surface.   Seborrheic keratoses are superficial and look as if they were stuck on the skin.  Under the microscope this type of keratosis looks like layers upon layers of skin.  That is why at times the top layer may seem to fall off, but the rest of the growth remains and re-grows.    Treatment Seborrheic keratoses do not need to be treated, but can easily be removed in the office.  Seborrheic keratoses often cause symptoms when they rub on clothing or jewelry.  Lesions can be in the way of shaving.  If they become inflamed, they can cause itching, soreness, or burning.  Removal of a seborrheic keratosis can be accomplished by freezing, burning, or surgery. If any spot bleeds, scabs, or grows rapidly, please return to have it checked, as these can be an indication of a skin cancer.  

## 2020-02-02 ENCOUNTER — Other Ambulatory Visit: Payer: Self-pay

## 2020-02-02 ENCOUNTER — Ambulatory Visit (INDEPENDENT_AMBULATORY_CARE_PROVIDER_SITE_OTHER): Payer: Managed Care, Other (non HMO) | Admitting: Internal Medicine

## 2020-02-02 ENCOUNTER — Encounter: Payer: Self-pay | Admitting: Internal Medicine

## 2020-02-02 VITALS — BP 122/84 | HR 56 | Temp 98.1°F | Resp 15 | Ht 65.0 in | Wt 163.6 lb

## 2020-02-02 DIAGNOSIS — E7800 Pure hypercholesterolemia, unspecified: Secondary | ICD-10-CM | POA: Diagnosis not present

## 2020-02-02 DIAGNOSIS — Z1231 Encounter for screening mammogram for malignant neoplasm of breast: Secondary | ICD-10-CM | POA: Diagnosis not present

## 2020-02-02 DIAGNOSIS — D126 Benign neoplasm of colon, unspecified: Secondary | ICD-10-CM

## 2020-02-02 DIAGNOSIS — M21611 Bunion of right foot: Secondary | ICD-10-CM

## 2020-02-02 DIAGNOSIS — M25561 Pain in right knee: Secondary | ICD-10-CM

## 2020-02-02 DIAGNOSIS — I10 Essential (primary) hypertension: Secondary | ICD-10-CM

## 2020-02-02 DIAGNOSIS — F330 Major depressive disorder, recurrent, mild: Secondary | ICD-10-CM

## 2020-02-02 DIAGNOSIS — Z0000 Encounter for general adult medical examination without abnormal findings: Secondary | ICD-10-CM

## 2020-02-02 DIAGNOSIS — M2011 Hallux valgus (acquired), right foot: Secondary | ICD-10-CM

## 2020-02-02 MED ORDER — AMLODIPINE BESYLATE 2.5 MG PO TABS: 2.5000 mg | ORAL_TABLET | Freq: Every day | ORAL | 3 refills | Status: DC

## 2020-02-02 MED ORDER — ZOSTER VAC RECOMB ADJUVANTED 50 MCG/0.5ML IM SUSR: 0.5000 mL | Freq: Once | INTRAMUSCULAR | 1 refills | Status: AC

## 2020-02-02 MED ORDER — OLMESARTAN MEDOXOMIL 40 MG PO TABS: 40.0000 mg | ORAL_TABLET | Freq: Every day | ORAL | 3 refills | Status: DC

## 2020-02-02 NOTE — Assessment & Plan Note (Signed)
With OA and DJD.  Tylenol 1000 mg q 12 hours

## 2020-02-02 NOTE — Assessment & Plan Note (Addendum)
Well controlled on current regimen of olmesartan 40 mg and amlodipine 2.5 mg daily  Using furosemide on weekends only.. Renal function is due, no changes today.  Lab Results  Component Value Date   CREATININE 0.94 07/24/2019   Lab Results  Component Value Date   NA 142 07/24/2019   K 4.5 07/24/2019   CL 102 07/24/2019   CO2 25 07/24/2019

## 2020-02-02 NOTE — Patient Instructions (Addendum)
For your knee pain   You can add up to 2000 mg of acetominophen (tylenol) every day safely  In divided doses (500 mg every 6 hours  Or 1000 mg every 12 hours.)   Mammogram ordered.  Due after dec 16   gI referral made for April 2022 colonoscopy due   The ShingRx vaccine is now available in local pharmacies and is much more protective than Zostavaxs,  It is therefore ADVISED for all interested adults over 50 to prevent shingles.   If your pharmacy is not going to give it until the [place],  You can go to the [facility] outpatient pharmacy   [phone] located  In the [facility] specialty building,  On the bottom floor (in thea back of the building)   Health Maintenance After Age 28 After age 70, you are at a higher risk for certain long-term diseases and infections as well as injuries from falls. Falls are a major cause of broken bones and head injuries in people who are older than age 28. Getting regular preventive care can help to keep you healthy and well. Preventive care includes getting regular testing and making lifestyle changes as recommended by your health care provider. Talk with your health care provider about:  Which screenings and tests you should have. A screening is a test that checks for a disease when you have no symptoms.  A diet and exercise plan that is right for you. What should I know about screenings and tests to prevent falls? Screening and testing are the best ways to find a health problem early. Early diagnosis and treatment give you the best chance of managing medical conditions that are common after age 80. Certain conditions and lifestyle choices may make you more likely to have a fall. Your health care provider may recommend:  Regular vision checks. Poor vision and conditions such as cataracts can make you more likely to have a fall. If you wear glasses, make sure to get your prescription updated if your vision changes.  Medicine review. Work with  your health care provider to regularly review all of the medicines you are taking, including over-the-counter medicines. Ask your health care provider about any side effects that may make you more likely to have a fall. Tell your health care provider if any medicines that you take make you feel dizzy or sleepy.  Osteoporosis screening. Osteoporosis is a condition that causes the bones to get weaker. This can make the bones weak and cause them to break more easily.  Blood pressure screening. Blood pressure changes and medicines to control blood pressure can make you feel dizzy.  Strength and balance checks. Your health care provider may recommend certain tests to check your strength and balance while standing, walking, or changing positions.  Foot health exam. Foot pain and numbness, as well as not wearing proper footwear, can make you more likely to have a fall.  Depression screening. You may be more likely to have a fall if you have a fear of falling, feel emotionally low, or feel unable to do activities that you used to do.  Alcohol use screening. Using too much alcohol can affect your balance and may make you more likely to have a fall. What actions can I take to lower my risk of falls? General instructions  Talk with your health care provider about your risks for falling. Tell your health care provider if: ? You fall. Be sure to tell your health care  provider about all falls, even ones that seem minor. ? You feel dizzy, sleepy, or off-balance.  Take over-the-counter and prescription medicines only as told by your health care provider. These include any supplements.  Eat a healthy diet and maintain a healthy weight. A healthy diet includes low-fat dairy products, low-fat (lean) meats, and fiber from whole grains, beans, and lots of fruits and vegetables. Home safety  Remove any tripping hazards, such as rugs, cords, and clutter.  Install safety equipment such as grab bars in bathrooms  and safety rails on stairs.  Keep rooms and walkways well-lit. Activity   Follow a regular exercise program to stay fit. This will help you maintain your balance. Ask your health care provider what types of exercise are appropriate for you.  If you need a cane or walker, use it as recommended by your health care provider.  Wear supportive shoes that have nonskid soles. Lifestyle  Do not drink alcohol if your health care provider tells you not to drink.  If you drink alcohol, limit how much you have: ? 0-1 drink a day for women. ? 0-2 drinks a day for men.  Be aware of how much alcohol is in your drink. In the [place] one drink equals one typical bottle of beer (12 oz), one-half glass of wine (5 oz), or one shot of hard liquor (1 oz).  Do not use any products that contain nicotine or tobacco, such as cigarettes and e-cigarettes. If you need help quitting, ask your health care provider. Summary  Having a healthy lifestyle and getting preventive care can help to protect your health and wellness after age 14.  Screening and testing are the best way to find a health problem early and help you avoid having a fall. Early diagnosis and treatment give you the best chance for managing medical conditions that are more common for people who are older than age 3.  Falls are a major cause of broken bones and head injuries in people who are older than age 21. Take precautions to prevent a fall at home.  Work with your health care provider to learn what changes you can make to improve your health and wellness and to prevent falls. This information is not intended to replace advice given to you by your health care provider. Make sure you discuss any questions you have with your health care provider. Document Revised: 06/20/2018 Document Reviewed: 01/10/2017 Elsevier Patient Education  2020 [employer].

## 2020-02-02 NOTE — Assessment & Plan Note (Signed)

## 2020-02-02 NOTE — Assessment & Plan Note (Signed)
Recurrent, managed with prozac.  Patient prefers to remain on current medication given recurrence of symptoms with prior  weaning trials. Refills given

## 2020-02-02 NOTE — Progress Notes (Signed)
Patient ID: SKYLARR LIZ, female    DOB: [birth year]  Age: 74 y.o. MRN: [id number]  The patient is here for annual preventive examination and management of other chronic and acute problems.   The risk factors are reflected in the social history.  The roster of all physicians providing medical care to patient - is listed in the Snapshot section of the chart.  Activities of daily living:  The patient is 100% independent in all ADLs: dressing, toileting, feeding as well as independent mobility  Home safety : The patient has smoke detectors in the home. They wear seatbelts.  There are no firearms at home. There is no violence in the home.   There is no risks for hepatitis, STDs or HIV. There is no   history of blood transfusion. They have no travel history to infectious disease endemic areas of the world.  The patient has seen their dentist in the last six month. They have seen their eye doctor in the last year.  She has hearing loss with regard  to whispered voices and some television programs.  They have deferred audiologic testing in the last year.  They do not  have excessive sun exposure. Discussed the need for sun protection: hats, long sleeves and use of sunscreen if there is significant sun exposure.   Diet: the importance of a healthy diet is discussed. They do have a healthy diet.  The benefits of regular aerobic exercise were discussed. She walks 4 times per week ,  20 minutes.   Depression screen: there are no signs or vegative symptoms of depression- irritability, change in appetite, anhedonia, sadness/tearfullness.  Cognitive assessment: the patient manages all their financial and personal affairs and is actively engaged. They could relate day,date,year and events; recalled 2/3 objects at 3 minutes; performed clock-face test normally.  The following portions of the patient's history were reviewed and updated as appropriate: allergies, current medications, past family history, past  medical history,  past surgical history, past social history  and problem list.  Visual acuity was not assessed per patient preference since she has regular follow up with her ophthalmologist. Hearing and body mass index were assessed and reviewed.   During the course of the visit the patient was educated and counseled about appropriate screening and preventive services including : fall prevention , diabetes screening, nutrition counseling, colorectal cancer screening, and recommended immunizations.    CC: The primary encounter diagnosis was Tubular adenoma of colon. 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  Hypertension: patient checks blood pressure twice weekly at home.  Readings have been for the most part < 140/80 at rest . Patient is following a reduce salt diet most days and is taking medications as prescribed  Right knee pain,  Intermittent occurs after inactivity  "catches" sometimes.  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   She has  a past surgical history that includes Abdominal hysterectomy and Total abdominal hysterectomy w/ bilateral salpingoophorectomy (1991).   Her family history includes Cancer (age of onset: 79) in her father; Heart disease in her father; Mental illness (age of onset: 40) in her  mother.She reports that she has never smoked. She has never used smokeless tobacco. She reports that she does not drink alcohol and does not use drugs.  Outpatient Medications Prior to Visit  Medication Sig Dispense Refill  . cetirizine (ZYRTEC) 5 MG tablet Take 5 mg by mouth daily.    Marland Kitchen esomeprazole (NEXIUM) 20 MG capsule Take 20 mg by mouth daily at 12 noon.    . famotidine (PEPCID) 20 MG tablet TAKE 1 TABLET BY MOUTH TWO  TIMES DAILY 180 tablet 1  . FLUoxetine (PROZAC) 40 MG capsule TAKE 1 CAPSULE BY MOUTH  DAILY 90 capsule 3  . furosemide (LASIX) 20 MG tablet Take 1 tablet (20 mg total) by mouth daily. As needed for fluid retention 30 tablet 0  . mometasone (ELOCON) 0.1 % cream Apply 1 application topically daily. 45 g 0  . Multiple Vitamin (MULTIVITAMIN) tablet Take 1 tablet by mouth daily.    Marland Kitchen amLODipine (NORVASC) 2.5 MG tablet Take 2.5 mg by mouth daily.    Marland Kitchen olmesartan (BENICAR) 40 MG tablet TAKE 1 TABLET BY MOUTH  DAILY 90 tablet 3  . cephALEXin (KEFLEX) 500 MG capsule Take 1 capsule (500 mg total) by mouth 4 (four) times daily. 28 capsule 0  . doxycycline (VIBRA-TABS) 100 MG tablet Take 1 tablet (100 mg total) by mouth 2 (two) times daily. 20 tablet 0   No facility-administered medications prior to visit.    Review of Systems   Patient denies headache, fevers, malaise, unintentional weight loss, skin rash, eye pain, sinus congestion and sinus pain, sore throat, dysphagia,  hemoptysis , cough, dyspnea, wheezing, chest pain, palpitations, orthopnea, edema, abdominal pain, nausea, melena, diarrhea, constipation, flank pain, dysuria, hematuria, urinary  Frequency, nocturia, numbness, tingling, seizures,  Focal weakness, Loss of consciousness,  Tremor, insomnia, depression, anxiety, and suicidal ideation.     Objective:  BP 122/84 (BP Location: Left Arm, Patient Position: Sitting, Cuff Size: Normal)   Pulse (!) 56   Temp 98.1 F (36.7 C) (Oral)   Resp 15   Ht 5\' 5"  (1.651 m)   Wt  163 lb 9.6 oz (74.2 kg)   SpO2 98%   BMI 27.22 kg/m   Physical Exam  General appearance: alert, cooperative and appears stated age Ears: normal TM's and external ear canals both ears Throat: lips, mucosa, and tongue normal; teeth and gums normal Neck: no adenopathy, no carotid bruit, supple, symmetrical, trachea midline and thyroid not enlarged, symmetric, no tenderness/mass/nodules Back: symmetric, no curvature. ROM normal. No CVA tenderness. Lungs: clear to auscultation bilaterally Heart: regular rate and rhythm, S1, S2 normal, no murmur, click, rub or gallop Abdomen: soft, non-tender; bowel sounds normal; no masses,  no organomegaly Pulses: 2+ and symmetric Ext:  Crepitus  Skin: Skin color, texture, turgor normal. No rashes or lesions Lymph nodes: Cervical, supraclavicular, and axillary nodes normal.   Assessment & Plan:   Problem List Items Addressed This Visit      Unprioritized   Right anterior knee pain    With OA and DJD.  Tylenol 1000 mg q 12 hours       Major depressive disorder, recurrent episode (HCC)    Recurrent, managed with prozac.  Patient prefers to remain on current medication given recurrence  of symptoms with prior  weaning trials. Refills given         Hypertension    Well controlled on current regimen of olmesartan 40 mg and amlodipine 2.5 mg daily  Using furosemide on weekends only.. Renal function is due, no changes today.  Lab Results  Component Value Date   CREATININE 0.94 07/24/2019   Lab Results  Component Value Date   NA 142 07/24/2019   K 4.5 07/24/2019   CL 102 07/24/2019   CO2 25 07/24/2019         Relevant Medications   olmesartan (BENICAR) 40 MG tablet   amLODipine (NORVASC) 2.5 MG tablet   Other Relevant Orders   Comprehensive metabolic panel   Hyperlipidemia   Relevant Medications   olmesartan (BENICAR) 40 MG tablet   amLODipine (NORVASC) 2.5 MG tablet   Other Relevant Orders   Lipid panel   TSH   Hallux valgus with  bunions   Encounter for preventive health examination    age appropriate education and counseling updated, referrals for preventative services and immunizations addressed, dietary and smoking counseling addressed, most recent labs reviewed.  I have personally reviewed and have noted:  1) the patient's medical and social history 2) The pt's use of alcohol, tobacco, and illicit drugs 3) The patient's current medications and supplements 4) Functional ability including ADL's, fall risk, home safety risk, hearing and visual impairment 5) Diet and physical activities 6) Evidence for depression or mood disorder 7) The patient's height, weight, and BMI have been recorded in the chart  I have made referrals, and provided counseling and education based on review of the above       Other Visit Diagnoses    Tubular adenoma of colon    -  Primary   Relevant Orders   Ambulatory referral to Gastroenterology   Breast cancer screening by mammogram       Relevant Orders   MM DIGITAL SCREENING BILATERAL      I have discontinued Heela Heishman. Chandley "Chris"'s cephALEXin and doxycycline. I have also changed her olmesartan and amLODipine. Additionally, I am having her start on Zoster Vaccine Adjuvanted. Lastly, I am having her maintain her cetirizine, multivitamin, esomeprazole, famotidine, mometasone, FLUoxetine, and furosemide.  Meds ordered this encounter  Medications  . olmesartan (BENICAR) 40 MG tablet    Sig: Take 1 tablet (40 mg total) by mouth daily.    Dispense:  90 tablet    Refill:  3    Requesting 1 year supply  . amLODipine (NORVASC) 2.5 MG tablet    Sig: Take 1 tablet (2.5 mg total) by mouth daily.    Dispense:  90 tablet    Refill:  3  . Zoster Vaccine Adjuvanted [facility]) injection    Sig: Inject 0.5 mLs into the muscle once for 1 dose.    Dispense:  1 each    Refill:  1    Medications Discontinued During This Encounter  Medication Reason  . cephALEXin (KEFLEX) 500 MG capsule    . doxycycline (VIBRA-TABS) 100 MG tablet   . olmesartan (BENICAR) 40 MG tablet Reorder  . amLODipine (NORVASC) 2.5 MG tablet Reorder    Follow-up: No follow-ups on file.   Crecencio Mc, MD

## 2020-02-03 LAB — COMPREHENSIVE METABOLIC PANEL
ALT: 15 IU/L (ref 0–32)
AST: 18 IU/L (ref 0–40)
Albumin/Globulin Ratio: 2.4 — ABNORMAL HIGH (ref 1.2–2.2)
Albumin: 4.7 g/dL (ref 3.7–4.7)
Alkaline Phosphatase: 61 IU/L (ref 44–121)
BUN/Creatinine Ratio: 12 (ref 12–28)
BUN: 12 mg/dL (ref 8–27)
Bilirubin Total: 0.5 mg/dL (ref 0.0–1.2)
CO2: 29 mmol/L (ref 20–29)
Calcium: 9.5 mg/dL (ref 8.7–10.3)
Chloride: 102 mmol/L (ref 96–106)
Creatinine, Ser: 1.01 mg/dL — ABNORMAL HIGH (ref 0.57–1.00)
GFR calc Af Amer: 64 mL/min/{1.73_m2} (ref >59)
GFR calc non Af Amer: 56 mL/min/{1.73_m2} — ABNORMAL LOW (ref >59)
Globulin, Total: 2 g/dL (ref 1.5–4.5)
Glucose: 89 mg/dL (ref 65–99)
Potassium: 4.6 mmol/L (ref 3.5–5.2)
Sodium: 140 mmol/L (ref 134–144)
Total Protein: 6.7 g/dL (ref 6.0–8.5)

## 2020-02-03 LAB — TSH: TSH: 2.45 u[IU]/mL (ref 0.450–4.500)

## 2020-02-03 LAB — LIPID PANEL
Chol/HDL Ratio: 4 ratio (ref 0.0–4.4)
Cholesterol, Total: 253 mg/dL — ABNORMAL HIGH (ref 100–199)
HDL: 63 mg/dL (ref >39)
LDL Chol Calc (NIH): 164 mg/dL — ABNORMAL HIGH (ref 0–99)
Triglycerides: 147 mg/dL (ref 0–149)
VLDL Cholesterol Cal: 26 mg/dL (ref 5–40)

## 2020-02-04 NOTE — Progress Notes (Signed)
Your labs are normal  except  for your cholesterol,  which has risen by about 40 pots since last visit.   I know that you are not able to take statins ,  but I was wondering if you would be willing to have a trial of zetia. you may tolerate it better than the statins that you did not tolerate previously.  It works by inhibiting the absorption of cholesterol by the small intestine, so it lowers LDL .  If you are willing to try it , let me know and I I will send it to your pharmacy for a 3 month trial.   Regards,   Deborra Medina, MD

## 2020-02-10 ENCOUNTER — Other Ambulatory Visit: Payer: Self-pay | Admitting: Internal Medicine

## 2020-02-10 MED ORDER — EZETIMIBE 10 MG PO TABS: 10.0000 mg | ORAL_TABLET | Freq: Every day | ORAL | 3 refills | Status: DC

## 2020-02-27 LAB — HM MAMMOGRAPHY

## 2020-03-03 ENCOUNTER — Telehealth: Payer: Self-pay | Admitting: *Deleted

## 2020-03-03 DIAGNOSIS — E7800 Pure hypercholesterolemia, unspecified: Secondary | ICD-10-CM

## 2020-03-03 NOTE — Telephone Encounter (Signed)
Please place future orders for lab appt.   NOTE: PT NEEDS LABS ORDERS FOR [facility].  Thanks

## 2020-03-03 NOTE — Telephone Encounter (Signed)
Thank you for the [place] reminder!   Fasting labs ordered (and I STILL forgot the first time)  TT

## 2020-03-09 ENCOUNTER — Other Ambulatory Visit (INDEPENDENT_AMBULATORY_CARE_PROVIDER_SITE_OTHER): Payer: Managed Care, Other (non HMO)

## 2020-03-09 ENCOUNTER — Other Ambulatory Visit: Payer: Self-pay

## 2020-03-09 DIAGNOSIS — E7800 Pure hypercholesterolemia, unspecified: Secondary | ICD-10-CM

## 2020-03-10 LAB — LIPID PANEL
Chol/HDL Ratio: 3.6 ratio (ref 0.0–4.4)
Cholesterol, Total: 219 mg/dL — ABNORMAL HIGH (ref 100–199)
HDL: 61 mg/dL (ref >39)
LDL Chol Calc (NIH): 137 mg/dL — ABNORMAL HIGH (ref 0–99)
Triglycerides: 117 mg/dL (ref 0–149)
VLDL Cholesterol Cal: 21 mg/dL (ref 5–40)

## 2020-03-10 LAB — COMPREHENSIVE METABOLIC PANEL
ALT: 14 IU/L (ref 0–32)
AST: 16 IU/L (ref 0–40)
Albumin/Globulin Ratio: 1.8 (ref 1.2–2.2)
Albumin: 4.2 g/dL (ref 3.7–4.7)
Alkaline Phosphatase: 61 IU/L (ref 44–121)
BUN/Creatinine Ratio: 12 (ref 12–28)
BUN: 12 mg/dL (ref 8–27)
Bilirubin Total: 0.3 mg/dL (ref 0.0–1.2)
CO2: 25 mmol/L (ref 20–29)
Calcium: 9.2 mg/dL (ref 8.7–10.3)
Chloride: 105 mmol/L (ref 96–106)
Creatinine, Ser: 1.04 mg/dL — ABNORMAL HIGH (ref 0.57–1.00)
GFR calc Af Amer: 62 mL/min/{1.73_m2} (ref >59)
GFR calc non Af Amer: 54 mL/min/{1.73_m2} — ABNORMAL LOW (ref >59)
Globulin, Total: 2.3 g/dL (ref 1.5–4.5)
Glucose: 89 mg/dL (ref 65–99)
Potassium: 4.8 mmol/L (ref 3.5–5.2)
Sodium: 142 mmol/L (ref 134–144)
Total Protein: 6.5 g/dL (ref 6.0–8.5)

## 2020-03-21 ENCOUNTER — Other Ambulatory Visit: Payer: Self-pay | Admitting: Internal Medicine

## 2020-05-22 ENCOUNTER — Encounter: Payer: Self-pay | Admitting: Internal Medicine

## 2020-07-16 LAB — HM COLONOSCOPY

## 2020-07-22 ENCOUNTER — Encounter: Payer: Self-pay | Admitting: Internal Medicine

## 2020-07-22 DIAGNOSIS — K219 Gastro-esophageal reflux disease without esophagitis: Secondary | ICD-10-CM

## 2020-08-02 ENCOUNTER — Other Ambulatory Visit: Payer: Self-pay

## 2020-08-02 ENCOUNTER — Ambulatory Visit: Payer: Managed Care, Other (non HMO) | Admitting: Internal Medicine

## 2020-08-02 ENCOUNTER — Encounter: Payer: Self-pay | Admitting: Internal Medicine

## 2020-08-02 VITALS — BP 120/70 | HR 64 | Temp 98.5°F | Ht 65.0 in | Wt 161.0 lb

## 2020-08-02 DIAGNOSIS — E7800 Pure hypercholesterolemia, unspecified: Secondary | ICD-10-CM

## 2020-08-02 DIAGNOSIS — K219 Gastro-esophageal reflux disease without esophagitis: Secondary | ICD-10-CM

## 2020-08-02 DIAGNOSIS — K449 Diaphragmatic hernia without obstruction or gangrene: Secondary | ICD-10-CM

## 2020-08-02 DIAGNOSIS — I10 Essential (primary) hypertension: Secondary | ICD-10-CM

## 2020-08-02 DIAGNOSIS — T466X5A Adverse effect of antihyperlipidemic and antiarteriosclerotic drugs, initial encounter: Secondary | ICD-10-CM

## 2020-08-02 DIAGNOSIS — N182 Chronic kidney disease, stage 2 (mild): Secondary | ICD-10-CM | POA: Insufficient documentation

## 2020-08-02 DIAGNOSIS — Z780 Asymptomatic menopausal state: Secondary | ICD-10-CM

## 2020-08-02 DIAGNOSIS — N183 Chronic kidney disease, stage 3 unspecified: Secondary | ICD-10-CM | POA: Insufficient documentation

## 2020-08-02 DIAGNOSIS — M791 Myalgia, unspecified site: Secondary | ICD-10-CM

## 2020-08-02 DIAGNOSIS — F330 Major depressive disorder, recurrent, mild: Secondary | ICD-10-CM

## 2020-08-02 DIAGNOSIS — N1831 Chronic kidney disease, stage 3a: Secondary | ICD-10-CM

## 2020-08-02 NOTE — Patient Instructions (Signed)
NO CHANGES TODAY  USE THE NEXIUM EVERY OTHER DAY TO KEEP SYMPTOMS OF GERD AT A MINIMUM.    DEXA SCAN ORDERED;  WHEN THEY CALL YOU CAN SCHEDULE IN THE FALL

## 2020-08-02 NOTE — Progress Notes (Signed)
Subjective:  Patient ID: Grace [name], female    DOB: [birth year]  Age: 75 y.o. MRN: [id number]  CC: The primary encounter diagnosis was Primary hypertension. Diagnoses of Pure hypercholesterolemia, Postmenopausal estrogen deficiency, Chronic kidney disease (CKD) stage G2/A2, mildly decreased glomerular filtration rate (GFR) between 60-89 mL/min/1.73 square meter and albuminuria creatinine ratio between 30-299 mg/g, Gastroesophageal reflux disease without esophagitis, Hiatal hernia, Mild episode of recurrent major depressive disorder ([place]), and Myalgia due to statin were also pertinent to this visit.  HPI Grace [name] presents for follow up on hypertension,  CKD stage 3 and other chronic issues  S/p colonscopy one week ago by Juanita Craver  Left eye cataract surgery by  Dr Jola Schmidt in [place] with lens implantation has had a minor  complication ,  Vision blurred due to a "wrinkle"  Procedure to correct has been postponed due to colonscopy for 3 weeks . Vision is gradually improving, may not need the procedure. Still farsighted.    HTN:  Patient is taking her medications as prescribed and notes no adverse effects.  Home BP readings have been done about once per week and are  generally < 130/80 .  She is avoiding added salt in her diet and physically [place] .  Amlodipine 2.5 mg, olmesartan 40 mg daily   Hiatal hernia:  Vomits occasionally if misses famotidine dose .  Takes nexium occasionally ffor several days when symptoms become persistent .  Has symptoms if she overeats or bends over after eating   CKD: avoiding NSAIDS.  GFR due for repeat today  Outpatient Medications Prior to Visit  Medication Sig Dispense Refill  . amLODipine (NORVASC) 2.5 MG tablet Take 1 tablet (2.5 mg total) by mouth daily. 90 tablet 3  . cetirizine (ZYRTEC) 5 MG tablet Take 5 mg by mouth daily.    Marland Kitchen esomeprazole (NEXIUM) 20 MG capsule Take 20 mg by mouth daily at 12 noon.    . ezetimibe (ZETIA)  10 MG tablet Take 1 tablet (10 mg total) by mouth daily. 90 tablet 3  . famotidine (PEPCID) 20 MG tablet TAKE 1 TABLET BY MOUTH TWO  TIMES DAILY 180 tablet 1  . FLUoxetine (PROZAC) 40 MG capsule TAKE 1 CAPSULE BY MOUTH  DAILY 90 capsule 3  . furosemide (LASIX) 20 MG tablet Take 1 tablet (20 mg total) by mouth daily. As needed for fluid retention 30 tablet 0  . mometasone (ELOCON) 0.1 % cream Apply 1 application topically daily. 45 g 0  . Multiple Vitamin (MULTIVITAMIN) tablet Take 1 tablet by mouth daily.    Marland Kitchen olmesartan (BENICAR) 40 MG tablet Take 1 tablet (40 mg total) by mouth daily. 90 tablet 3  . prednisoLONE acetate (PRED FORTE) 1 % ophthalmic suspension 1 drop 4 (four) times daily.    Marland Kitchen CLENPIQ 10-3.5-12 MG-GM -GM/160ML SOLN Take by mouth as directed.    . moxifloxacin (VIGAMOX) 0.5 % ophthalmic solution Place 1 drop into the left eye 4 (four) times daily.     No facility-administered medications prior to visit.    Review of Systems;  Patient denies headache, fevers, malaise, unintentional weight loss, skin rash, eye pain, sinus congestion and sinus pain, sore throat, dysphagia,  hemoptysis , cough, dyspnea, wheezing, chest pain, palpitations, orthopnea, edema, abdominal pain, nausea, melena, diarrhea, constipation, flank pain, dysuria, hematuria, urinary  Frequency, nocturia, numbness, tingling, seizures,  Focal weakness, Loss of consciousness,  Tremor, insomnia, depression, anxiety, and suicidal ideation.  Objective:  BP 120/70   Pulse 64   Temp 98.5 F (36.9 C) (Oral)   Ht 5\' 5"  (1.651 m)   Wt 161 lb (73 kg)   SpO2 97%   BMI 26.79 kg/m   BP Readings from Last 3 Encounters:  08/02/20 120/70  02/02/20 122/84  08/13/19 132/80    Wt Readings from Last 3 Encounters:  08/02/20 161 lb (73 kg)  02/02/20 163 lb 9.6 oz (74.2 kg)  08/13/19 164 lb (74.4 kg)    General appearance: alert, cooperative and appears stated age Ears: normal TM's and external ear canals both  ears Throat: lips, mucosa, and tongue normal; teeth and gums normal Neck: no adenopathy, no carotid bruit, supple, symmetrical, trachea midline and thyroid not enlarged, symmetric, no tenderness/mass/nodules Back: symmetric, no curvature. ROM normal. No CVA tenderness. Lungs: clear to auscultation bilaterally Heart: regular rate and rhythm, S1, S2 normal, no murmur, click, rub or gallop Abdomen: soft, non-tender; bowel sounds normal; no masses,  no organomegaly Pulses: 2+ and symmetric Skin: Skin color, texture, turgor normal. No rashes or lesions Lymph nodes: Cervical, supraclavicular, and axillary nodes normal.  No results found for: HGBA1C  Lab Results  Component Value Date   CREATININE 1.04 (H) 03/09/2020   CREATININE 1.01 (H) 02/02/2020   CREATININE 0.94 07/24/2019    Lab Results  Component Value Date   WBC 6.1 07/24/2019   HGB 12.9 07/24/2019   HCT 39.0 07/24/2019   PLT 261 07/24/2019   GLUCOSE 89 03/09/2020   CHOL 219 (H) 03/09/2020   TRIG 117 03/09/2020   HDL 61 03/09/2020   LDLDIRECT 130 (H) 12/16/2015   LDLCALC 137 (H) 03/09/2020   ALT 14 03/09/2020   AST 16 03/09/2020   NA 142 03/09/2020   K 4.8 03/09/2020   CL 105 03/09/2020   CREATININE 1.04 (H) 03/09/2020   BUN 12 03/09/2020   CO2 25 03/09/2020   TSH 2.450 02/02/2020    No results found.  Assessment & Plan:   Problem List Items Addressed This Visit      Unprioritized   Chronic kidney disease (CKD) stage G2/A2, mildly decreased glomerular filtration rate (GFR) between 60-89 mL/min/1.73 square meter and albuminuria creatinine ratio between 30-299 mg/g    Managed with olmesartan and tight control of BP,  avoidance of NSAIDs.  She is on an ARB for control of hypertension,, and statin intolerant due to myalgias.       GERD (gastroesophageal reflux disease)    No Barett's esophagus by  May 2022 EGD.  Continue daily H2 blocker with prn use of PPI       Relevant Medications   CLENPIQ 10-3.5-12 MG-GM  -GM/160ML SOLN   Hiatal hernia    Reviewed treatment options for management.  Continue H2/PPI,  Smaller meals, activity modification        Hyperlipidemia    She is Statin intolerant due to myalgias.  Her LDL remains close to goal  with quercetin  Lab Results  Component Value Date   CHOL 219 (H) 03/09/2020   HDL 61 03/09/2020   LDLCALC 137 (H) 03/09/2020   LDLDIRECT 130 (H) 12/16/2015   TRIG 117 03/09/2020   CHOLHDL 3.6 03/09/2020         Relevant Orders   Comprehensive metabolic panel   Lipid panel   Hypertension - Primary   Relevant Orders   Microalbumin / creatinine urine ratio   Major depressive disorder, recurrent episode (HCC)    Recurrent, managed with prozac.  Patient  prefers to remain on current medication given recurrence of symptoms with prior  weaning trials. Refills given         Myalgia due to statin    She is Statin intolerant due to myalgias.  Her LDL remains closed to goal  with quercetin  Lab Results  Component Value Date   CHOL 219 (H) 03/09/2020   HDL 61 03/09/2020   LDLCALC 137 (H) 03/09/2020   LDLDIRECT 130 (H) 12/16/2015   TRIG 117 03/09/2020   CHOLHDL 3.6 03/09/2020          Other Visit Diagnoses    Postmenopausal estrogen deficiency       Relevant Orders   DG Bone Density     . I provided  30 minutes of  face-to-face time during this encounter reviewing patient's current problems and recent EGD/colonoscopy, eye surgery, labs and imaging studies, providing counseling on the above mentioned problems , and coordination  of care .  I am having Iviana Blasingame. Hagadorn "Gerald Stabs" maintain her cetirizine, multivitamin, esomeprazole, famotidine, mometasone, furosemide, olmesartan, amLODipine, ezetimibe, FLUoxetine, moxifloxacin, prednisoLONE acetate, and Clenpiq.  No orders of the defined types were placed in this encounter.   There are no discontinued medications.  Follow-up: Return in about 6 months (around 02/02/2021).   Crecencio Mc,  MD

## 2020-08-03 LAB — COMPREHENSIVE METABOLIC PANEL
ALT: 11 IU/L (ref 0–32)
AST: 15 IU/L (ref 0–40)
Albumin/Globulin Ratio: 1.8 (ref 1.2–2.2)
Albumin: 4.3 g/dL (ref 3.7–4.7)
Alkaline Phosphatase: 66 IU/L (ref 44–121)
BUN/Creatinine Ratio: 12 (ref 12–28)
BUN: 12 mg/dL (ref 8–27)
Bilirubin Total: 0.5 mg/dL (ref 0.0–1.2)
CO2: 24 mmol/L (ref 20–29)
Calcium: 9.5 mg/dL (ref 8.7–10.3)
Chloride: 102 mmol/L (ref 96–106)
Creatinine, Ser: 1.03 mg/dL — ABNORMAL HIGH (ref 0.57–1.00)
Globulin, Total: 2.4 g/dL (ref 1.5–4.5)
Glucose: 84 mg/dL (ref 65–99)
Potassium: 4.4 mmol/L (ref 3.5–5.2)
Sodium: 141 mmol/L (ref 134–144)
Total Protein: 6.7 g/dL (ref 6.0–8.5)
eGFR: 57 mL/min/{1.73_m2} — ABNORMAL LOW (ref >59)

## 2020-08-03 LAB — LIPID PANEL
Chol/HDL Ratio: 3.1 ratio (ref 0.0–4.4)
Cholesterol, Total: 219 mg/dL — ABNORMAL HIGH (ref 100–199)
HDL: 70 mg/dL (ref >39)
LDL Chol Calc (NIH): 131 mg/dL — ABNORMAL HIGH (ref 0–99)
Triglycerides: 105 mg/dL (ref 0–149)
VLDL Cholesterol Cal: 18 mg/dL (ref 5–40)

## 2020-08-03 LAB — MICROALBUMIN / CREATININE URINE RATIO
Creatinine, Urine: 221.9 mg/dL
Microalb/Creat Ratio: 9 mg/g creat (ref 0–29)
Microalbumin, Urine: 20.4 ug/mL

## 2020-08-03 NOTE — Assessment & Plan Note (Signed)
She is Statin intolerant due to myalgias.  Her LDL remains close to goal  with quercetin  Lab Results  Component Value Date   CHOL 219 (H) 03/09/2020   HDL 61 03/09/2020   LDLCALC 137 (H) 03/09/2020   LDLDIRECT 130 (H) 12/16/2015   TRIG 117 03/09/2020   CHOLHDL 3.6 03/09/2020

## 2020-08-03 NOTE — Assessment & Plan Note (Signed)
Recurrent, managed with prozac.  Patient prefers to remain on current medication given recurrence of symptoms with prior  weaning trials. Refills given

## 2020-08-03 NOTE — Assessment & Plan Note (Signed)
She is Statin intolerant due to myalgias.  Her LDL remains closed to goal  with quercetin  Lab Results  Component Value Date   CHOL 219 (H) 03/09/2020   HDL 61 03/09/2020   LDLCALC 137 (H) 03/09/2020   LDLDIRECT 130 (H) 12/16/2015   TRIG 117 03/09/2020   CHOLHDL 3.6 03/09/2020

## 2020-08-03 NOTE — Assessment & Plan Note (Addendum)
No Barett's esophagus by  May 2022 EGD.  Continue daily H2 blocker with prn use of PPI

## 2020-08-03 NOTE — Assessment & Plan Note (Addendum)
Managed with olmesartan and tight control of BP,  avoidance of NSAIDs.  She is on an ARB for control of hypertension,, and statin intolerant due to myalgias.

## 2020-08-03 NOTE — Assessment & Plan Note (Signed)
Reviewed treatment options for management.  Continue H2/PPI,  Smaller meals, activity modification

## 2020-09-07 ENCOUNTER — Encounter: Payer: Self-pay | Admitting: Internal Medicine

## 2020-10-21 ENCOUNTER — Other Ambulatory Visit: Payer: Self-pay

## 2020-10-21 ENCOUNTER — Ambulatory Visit
Admission: RE | Admit: 2020-10-21 | Discharge: 2020-10-21 | Disposition: A | Discharge status: Home / Self Care | Payer: Managed Care, Other (non HMO) | Source: Ambulatory Visit | Attending: Internal Medicine | Admitting: Internal Medicine

## 2020-10-21 DIAGNOSIS — Z780 Asymptomatic menopausal state: Secondary | ICD-10-CM | POA: Insufficient documentation

## 2020-12-28 ENCOUNTER — Other Ambulatory Visit: Payer: Self-pay | Admitting: Internal Medicine

## 2021-01-05 ENCOUNTER — Ambulatory Visit: Payer: Managed Care, Other (non HMO) | Admitting: Internal Medicine

## 2021-01-11 ENCOUNTER — Other Ambulatory Visit: Payer: Self-pay | Admitting: Internal Medicine

## 2021-02-01 ENCOUNTER — Ambulatory Visit: Payer: Managed Care, Other (non HMO) | Admitting: Dermatology

## 2021-02-02 ENCOUNTER — Encounter: Payer: Self-pay | Admitting: Internal Medicine

## 2021-02-02 ENCOUNTER — Other Ambulatory Visit: Payer: Self-pay

## 2021-02-02 ENCOUNTER — Ambulatory Visit (INDEPENDENT_AMBULATORY_CARE_PROVIDER_SITE_OTHER): Payer: Managed Care, Other (non HMO) | Admitting: Internal Medicine

## 2021-02-02 VITALS — BP 124/82 | HR 68 | Temp 96.5°F | Ht 65.0 in | Wt 159.6 lb

## 2021-02-02 DIAGNOSIS — Z23 Encounter for immunization: Secondary | ICD-10-CM | POA: Diagnosis not present

## 2021-02-02 DIAGNOSIS — I890 Lymphedema, not elsewhere classified: Secondary | ICD-10-CM

## 2021-02-02 DIAGNOSIS — N182 Chronic kidney disease, stage 2 (mild): Secondary | ICD-10-CM | POA: Diagnosis not present

## 2021-02-02 DIAGNOSIS — F330 Major depressive disorder, recurrent, mild: Secondary | ICD-10-CM

## 2021-02-02 DIAGNOSIS — Z0000 Encounter for general adult medical examination without abnormal findings: Secondary | ICD-10-CM

## 2021-02-02 DIAGNOSIS — K449 Diaphragmatic hernia without obstruction or gangrene: Secondary | ICD-10-CM

## 2021-02-02 DIAGNOSIS — G6289 Other specified polyneuropathies: Secondary | ICD-10-CM | POA: Diagnosis not present

## 2021-02-02 NOTE — Assessment & Plan Note (Signed)
Colonoscopy normal this summer ([facility])  age appropriate education and counseling updated, referrals for preventative services and immunizations addressed, dietary and smoking counseling addressed, most recent labs reviewed.  I have personally reviewed and have noted:  1) the patient's medical and social history 2) The pt's use of alcohol, tobacco, and illicit drugs 3) The patient's current medications and supplements 4) Functional ability including ADL's, fall risk, home safety risk, hearing and visual impairment 5) Diet and physical activities 6) Evidence for depression or mood disorder 7) The patient's height, weight, and BMI have been recorded in the chart  I have made referrals, and provided counseling and education based on review of the above

## 2021-02-02 NOTE — Assessment & Plan Note (Signed)
Of RLE.  Screening was negative and repeated by Neurology May 2019.  EMG.[place] studies recommended but deferred by patient . No motor symptoms

## 2021-02-02 NOTE — Assessment & Plan Note (Signed)
Recurrent, managed with prozac.  Patient prefers to remain on current medication given recurrence of symptoms with prior  weaning trials. Refills given

## 2021-02-02 NOTE — Assessment & Plan Note (Signed)
Suggested by appearance today , with intermittent petechial rash reviewed via photograph only.Marland Kitchen  Referring to Vascular Surgery for evaluation and treatment

## 2021-02-02 NOTE — Patient Instructions (Signed)
I am concerned that the rash and swelling in your right leg is due to lymphedema which can worsen if not managed consistently  Referral to [facility] to determine if this is the case and to treat   You have osteopenia by recent DEXA ,  so  Your  risk of fracture in the next  10 yrs is about 5% .  I recommend weight bearing exercise, 1200 mg calcium through diet /supplements ,  1000 units Vit D daily  And a Repeat DEXA in 2 years .  I recommend getting the majority of your calcium and Vitamin D  through dietary sources rather than supplements given the recent association of calcium supplements with increased coronary artery calcium scores    almond/coconut milk  , soy milk and cashew milk are great nondairy alternatives to dairy sources and are  low calorie low carb, low phosphorous and cholesterol free .   May  you have the time  with family to reflect on God's blessings ,  and may God bless you with health and prosperity in the [place] Year!  Regards,   Deborra Medina, MD

## 2021-02-02 NOTE — Assessment & Plan Note (Signed)
Reviewed treatment options for management.  Continue H2/PPI,  Smaller meals, activity modification

## 2021-02-02 NOTE — Assessment & Plan Note (Addendum)
Managed by [facility] with annual visits. Marland Kitchen  GFR has been stable for the past year.

## 2021-02-02 NOTE — Progress Notes (Signed)
Patient ID: Grace [name], female    DOB: Mar 02, 1946  Age: 75 y.o. MRN: [id number]  The patient is here for preventive examination and management of other chronic and acute problems.    This visit occurred during the SARS-CoV-2 public health emergency.  Safety protocols were in place, including screening questions prior to the visit, additional usage of staff PPE, and extensive cleaning of exam room while observing appropriate contact time as indicated for disinfecting solutions.   The risk factors are reflected in the social history.  The roster of all physicians providing medical care to patient - is listed in the Snapshot section of the chart.  Activities of daily living:  The patient is 100% independent in all ADLs: dressing, toileting, feeding as well as independent mobility  Home safety : The patient has smoke detectors in the home. They wear seatbelts.  There are no firearms at home. There is no violence in the home.   There is no risks for hepatitis, STDs or HIV. There is no   history of blood transfusion. They have no travel history to infectious disease endemic areas of the world.  The patient has seen their dentist in the last six month. They have seen their eye doctor in the last year. They admit to slight hearing difficulty with regard to whispered voices and some television programs.  They have deferred audiologic testing in the last year.  They do not  have excessive sun exposure. Discussed the need for sun protection: hats, long sleeves and use of sunscreen if there is significant sun exposure.   Diet: the importance of a healthy diet is discussed. They do have a healthy diet.  The benefits of regular aerobic exercise were discussed. She walks 4 times per week ,  20 minutes.   Depression screen: there are no signs or vegative symptoms of depression- irritability, change in appetite, anhedonia, sadness/tearfullness.  Cognitive assessment: the patient manages all their  financial and personal affairs and is actively engaged. They could relate day,date,year and events; recalled 2/3 objects at 3 minutes; performed clock-face test normally.  The following portions of the patient's history were reviewed and updated as appropriate: allergies, current medications, past family history, past medical history,  past surgical history, past social history  and problem list.  Visual acuity was not assessed per patient preference since she has regular follow up with her ophthalmologist. Hearing and body mass index were assessed and reviewed.   During the course of the visit the patient was educated and counseled about appropriate screening and preventive services including : fall prevention , diabetes screening, nutrition counseling, colorectal cancer screening, and recommended immunizations.    CC: The primary encounter diagnosis was Chronic kidney disease (CKD) stage G2/A2, mildly decreased glomerular filtration rate (GFR) between 60-89 mL/min/1.73 square meter and albuminuria creatinine ratio between 30-299 mg/g. Diagnoses of Lymphedema, Need for pneumococcal vaccination, Mild episode of recurrent major depressive disorder ([place]), Other polyneuropathy, Encounter for preventive health examination, and Hiatal hernia were also pertinent to this visit.  1)  recurrent episodes of a nonpruritic  petechial rash occurring on RLE .   Leg seems to be more swolle lately when episodes occur.  No fevers, joint pain or residual stigmata when rash resolves.   Also has episodes of tingling  suggestive of neuropathy.  Was referred to dr Melrose Nakayama in 2010 and deferred EMG/[place] studies .   Ankle has the appearance of lymphedema   History Grace [name] has a past medical history of allergic rhinitis,  Basal cell carcinoma, Benign heart murmur, Depression, GERD (gastroesophageal reflux disease), colonic polyps, Hyperlipidemia, and Hypertension.   She has a past surgical history that includes Abdominal  hysterectomy and Total abdominal hysterectomy w/ bilateral salpingoophorectomy (1991).   Her family history includes Cancer (age of onset: 18) in her father; Heart disease in her father; Mental illness (age of onset: 20) in her mother.She reports that she has never smoked. She has never used smokeless tobacco. She reports that she does not drink alcohol and does not use drugs.  Outpatient Medications Prior to Visit  Medication Sig Dispense Refill   amLODipine (NORVASC) 2.5 MG tablet TAKE 1 TABLET BY MOUTH  DAILY 90 tablet 3   cetirizine (ZYRTEC) 5 MG tablet Take 5 mg by mouth daily.     esomeprazole (NEXIUM) 20 MG capsule Take 20 mg by mouth daily at 12 noon.     ezetimibe (ZETIA) 10 MG tablet TAKE 1 TABLET BY MOUTH  DAILY 90 tablet 3   famotidine (PEPCID) 20 MG tablet TAKE 1 TABLET BY MOUTH TWO  TIMES DAILY 180 tablet 1   FLUoxetine (PROZAC) 40 MG capsule TAKE 1 CAPSULE BY MOUTH  DAILY 90 capsule 3   furosemide (LASIX) 20 MG tablet Take 1 tablet (20 mg total) by mouth daily. As needed for fluid retention 30 tablet 0   mometasone (ELOCON) 0.1 % cream Apply 1 application topically daily. 45 g 0   Multiple Vitamin (MULTIVITAMIN) tablet Take 1 tablet by mouth daily.     olmesartan (BENICAR) 40 MG tablet Take 1 tablet (40 mg total) by mouth daily. 90 tablet 3   CLENPIQ 10-3.5-12 MG-GM -GM/160ML SOLN Take by mouth as directed.     moxifloxacin (VIGAMOX) 0.5 % ophthalmic solution Place 1 drop into the left eye 4 (four) times daily.     prednisoLONE acetate (PRED FORTE) 1 % ophthalmic suspension 1 drop 4 (four) times daily.     No facility-administered medications prior to visit.    Review of Systems  Patient denies headache, fevers, malaise, unintentional weight loss, skin rash, eye pain, sinus congestion and sinus pain, sore throat, dysphagia,  hemoptysis , cough, dyspnea, wheezing, chest pain, palpitations, orthopnea, edema, abdominal pain, nausea, melena, diarrhea, constipation, flank pain,  dysuria, hematuria, urinary  Frequency, nocturia, numbness, tingling, seizures,  Focal weakness, Loss of consciousness,  Tremor, insomnia, depression, anxiety, and suicidal ideation.     Objective:  BP 124/82   Pulse 68   Temp (!) 96.5 F (35.8 C)   Ht 5\' 5"  (1.651 m)   Wt 159 lb 9.6 oz (72.4 kg)   SpO2 95%   BMI 26.56 kg/m   Physical Exam . General appearance: alert, cooperative and appears stated age Head: Normocephalic, without obvious abnormality, atraumatic Eyes: conjunctivae/corneas clear. PERRL, EOM's intact. Fundi benign. Ears: normal TM's and external ear canals both ears Nose: Nares normal. Septum midline. Mucosa normal. No drainage or sinus tenderness. Throat: lips, mucosa, and tongue normal; teeth and gums normal Neck: no adenopathy, no carotid bruit, no JVD, supple, symmetrical, trachea midline and thyroid not enlarged, symmetric, no tenderness/mass/nodules Lungs: clear to auscultation bilaterally Breasts: normal appearance, no masses or tenderness Heart: regular rate and rhythm, S1, S2 normal, no murmur, click, rub or gallop Abdomen: soft, non-tender; bowel sounds normal; no masses,  no organomegaly Extremities: extremities normal, atraumatic, no cyanosis .  Lymphedema at ankle  Pulses: 2+ and symmetric Skin: Skin color, texture, turgor normal. No rashes or lesions Neurologic: Alert and oriented X 3, normal strength  and tone. Normal symmetric reflexes. Normal coordination and gait.     Assessment & Plan:   Problem List Items Addressed This Visit     Major depressive disorder, recurrent episode ([place])    Recurrent, managed with prozac.  Patient prefers to remain on current medication given recurrence of symptoms with prior  weaning trials. Refills given         Encounter for preventive health examination    Colonoscopy normal this summer ([facility])  age appropriate education and counseling updated, referrals for preventative services and  immunizations addressed, dietary and smoking counseling addressed, most recent labs reviewed.  I have personally reviewed and have noted:   1) the patient's medical and social history 2) The pt's use of alcohol, tobacco, and illicit drugs 3) The patient's current medications and supplements 4) Functional ability including ADL's, fall risk, home safety risk, hearing and visual impairment 5) Diet and physical activities 6) Evidence for depression or mood disorder 7) The patient's height, weight, and BMI have been recorded in the chart   I have made referrals, and provided counseling and education based on review of the above      Hiatal hernia    Reviewed treatment options for management.  Continue H2/PPI,  Smaller meals, activity modification        Peripheral neuropathy    Of RLE.  Screening was negative and repeated by Neurology May 2019.  EMG.[place] studies recommended but deferred by patient . No motor symptoms       Chronic kidney disease (CKD) stage G2/A2, mildly decreased glomerular filtration rate (GFR) between 60-89 mL/min/1.73 square meter and albuminuria creatinine ratio between 30-299 mg/g - Primary    Managed by [facility] with annual visits. Marland Kitchen  GFR has been stable for the past year.       Relevant Orders   Comprehensive metabolic panel   Lipid panel   CBC with Differential/Platelet   Lymphedema    Suggested by appearance today , with intermittent petechial rash reviewed via photograph only.Marland Kitchen  Referring to Vascular Surgery for evaluation and treatment      Relevant Orders   Ambulatory referral to Vascular Surgery   Other Visit Diagnoses     Need for pneumococcal vaccination       Relevant Orders   Pneumococcal conjugate vaccine 20-valent (Prevnar 20) (Completed)       I am having Shirlyn Goltz. Rawling "Gerald Stabs" maintain her cetirizine, multivitamin, esomeprazole, famotidine, mometasone, furosemide, olmesartan, moxifloxacin, prednisoLONE acetate, Clenpiq, ezetimibe,  amLODipine, and FLUoxetine.  No orders of the defined types were placed in this encounter.   There are no discontinued medications.  Follow-up: No follow-ups on file.   Crecencio Mc, MD

## 2021-02-03 LAB — LIPID PANEL
Chol/HDL Ratio: 3.2 ratio (ref 0.0–4.4)
Cholesterol, Total: 217 mg/dL — ABNORMAL HIGH (ref 100–199)
HDL: 67 mg/dL (ref >39)
LDL Chol Calc (NIH): 131 mg/dL — ABNORMAL HIGH (ref 0–99)
Triglycerides: 109 mg/dL (ref 0–149)
VLDL Cholesterol Cal: 19 mg/dL (ref 5–40)

## 2021-02-03 LAB — COMPREHENSIVE METABOLIC PANEL
ALT: 17 IU/L (ref 0–32)
AST: 19 IU/L (ref 0–40)
Albumin/Globulin Ratio: 2.5 — ABNORMAL HIGH (ref 1.2–2.2)
Albumin: 4.7 g/dL (ref 3.7–4.7)
Alkaline Phosphatase: 53 IU/L (ref 44–121)
BUN/Creatinine Ratio: 10 — ABNORMAL LOW (ref 12–28)
BUN: 10 mg/dL (ref 8–27)
Bilirubin Total: 0.5 mg/dL (ref 0.0–1.2)
CO2: 25 mmol/L (ref 20–29)
Calcium: 9.3 mg/dL (ref 8.7–10.3)
Chloride: 102 mmol/L (ref 96–106)
Creatinine, Ser: 0.99 mg/dL (ref 0.57–1.00)
Globulin, Total: 1.9 g/dL (ref 1.5–4.5)
Glucose: 93 mg/dL (ref 70–99)
Potassium: 4.4 mmol/L (ref 3.5–5.2)
Sodium: 141 mmol/L (ref 134–144)
Total Protein: 6.6 g/dL (ref 6.0–8.5)
eGFR: 60 mL/min/{1.73_m2} (ref >59)

## 2021-02-03 LAB — CBC WITH DIFFERENTIAL/PLATELET
Basophils Absolute: 0.1 10*3/uL (ref 0.0–0.2)
Basos: 1 %
EOS (ABSOLUTE): 0.1 10*3/uL (ref 0.0–0.4)
Eos: 1 %
Hematocrit: 40.7 % (ref 34.0–46.6)
Hemoglobin: 14 g/dL (ref 11.1–15.9)
Immature Grans (Abs): 0 10*3/uL (ref 0.0–0.1)
Immature Granulocytes: 0 %
Lymphocytes Absolute: 2.4 10*3/uL (ref 0.7–3.1)
Lymphs: 32 %
MCH: 32.6 pg (ref 26.6–33.0)
MCHC: 34.4 g/dL (ref 31.5–35.7)
MCV: 95 fL (ref 79–97)
Monocytes Absolute: 0.5 10*3/uL (ref 0.1–0.9)
Monocytes: 6 %
Neutrophils Absolute: 4.6 10*3/uL (ref 1.4–7.0)
Neutrophils: 60 %
Platelets: 253 10*3/uL (ref 150–450)
RBC: 4.29 x10E6/uL (ref 3.77–5.28)
RDW: 12.9 % (ref 11.7–15.4)
WBC: 7.6 10*3/uL (ref 3.4–10.8)

## 2021-02-28 ENCOUNTER — Encounter (INDEPENDENT_AMBULATORY_CARE_PROVIDER_SITE_OTHER): Payer: Self-pay | Admitting: Nurse Practitioner

## 2021-02-28 ENCOUNTER — Ambulatory Visit (INDEPENDENT_AMBULATORY_CARE_PROVIDER_SITE_OTHER): Payer: Managed Care, Other (non HMO) | Admitting: Nurse Practitioner

## 2021-02-28 ENCOUNTER — Other Ambulatory Visit: Payer: Self-pay

## 2021-02-28 VITALS — BP 176/98 | HR 60 | Resp 16 | Wt 158.2 lb

## 2021-02-28 DIAGNOSIS — I890 Lymphedema, not elsewhere classified: Secondary | ICD-10-CM

## 2021-03-07 ENCOUNTER — Encounter (INDEPENDENT_AMBULATORY_CARE_PROVIDER_SITE_OTHER): Payer: Self-pay | Admitting: Nurse Practitioner

## 2021-03-07 NOTE — Progress Notes (Signed)
Subjective:    Patient ID: Grace [name], female    DOB: [birth year], 75 y.o.   MRN: [id number] Chief Complaint  Patient presents with   New Patient (Initial Visit)    Ref vascular consult    Mckenzy Salazar is a 75 year old female that is referred by her primary care physician Dr. Derrel Nip in regards to concern for lymphedema with notable red areas that occur with her worsening swelling.  Patient notes that this is not consistent.  It comes and goes much like the swelling itself.  She notes that it tends to be worse during the summer months.  She notes that the swelling is not something that is out of control for her currently.  She also utilizes conservative therapy tactics including elevation and activity.   Review of Systems  Cardiovascular:  Positive for leg swelling.  Skin:  Positive for rash.  All other systems reviewed and are negative.     Objective:   Physical Exam Vitals reviewed.  HENT:     Head: Normocephalic.  Cardiovascular:     Rate and Rhythm: Normal rate.     Pulses: Normal pulses.  Pulmonary:     Effort: Pulmonary effort is normal.  Skin:    General: Skin is warm and dry.  Neurological:     Mental Status: She is alert and oriented to person, place, and time.  Psychiatric:        Mood and Affect: Mood normal.        Behavior: Behavior normal.        Thought Content: Thought content normal.        Judgment: Judgment normal.    BP (!) 176/98 (BP Location: Left Arm)    Pulse 60    Resp 16    Wt 158 lb 3.2 oz (71.8 kg)    BMI 26.33 kg/m   Past Medical History:  Diagnosis Date   allergic rhinitis    Basal cell carcinoma    Areas of face   Benign heart murmur    Depression    resolved   GERD (gastroesophageal reflux disease)    Hx of colonic polyps    Hyperlipidemia    Hypertension     Social History   Socioeconomic History   Marital status: Married    Spouse name: Not on file   Number of children: Not on file   Years of education: Not on file    Highest education level: Not on file  Occupational History   Not on file  Tobacco Use   Smoking status: Never   Smokeless tobacco: Never  Substance and Sexual Activity   Alcohol use: No   Drug use: No   Sexual activity: Not on file  Other Topics Concern   Not on file  Social History Narrative   Not on file   Social Determinants of Health   Financial Resource Strain: Not on file  Food Insecurity: Not on file  Transportation Needs: Not on file  Physical Activity: Not on file  Stress: Not on file  Social Connections: Not on file  Intimate Partner Violence: Not on file    Past Surgical History:  Procedure Laterality Date   ABDOMINAL HYSTERECTOMY     TOTAL ABDOMINAL HYSTERECTOMY W/ BILATERAL SALPINGOOPHORECTOMY  1991   excessive bleeding    Family History  Problem Relation Age of Onset   Mental illness Mother 48       alzheimers dementia   Cancer Father 87  prostate   Heart disease Father        pacemaker,  no AMI    No Known Allergies  CBC Latest Ref Rng & Units 02/02/2021 07/24/2019 01/16/2019  WBC 3.4 - 10.8 x10E3/uL 7.6 6.1 6.4  Hemoglobin 11.1 - 15.9 g/dL 14.0 12.9 13.5  Hematocrit 34.0 - 46.6 % 40.7 39.0 38.6  Platelets 150 - 450 x10E3/uL 253 261 248      CMP     Component Value Date/Time   NA 141 02/02/2021 0937   K 4.4 02/02/2021 0937   CL 102 02/02/2021 0937   CO2 25 02/02/2021 0937   GLUCOSE 93 02/02/2021 0937   GLUCOSE 91 11/25/2014 0921   BUN 10 02/02/2021 0937   CREATININE 0.99 02/02/2021 0937   CREATININE 0.84 03/27/2012 0802   CALCIUM 9.3 02/02/2021 0937   PROT 6.6 02/02/2021 0937   ALBUMIN 4.7 02/02/2021 0937   AST 19 02/02/2021 0937   ALT 17 02/02/2021 0937   ALKPHOS 53 02/02/2021 0937   BILITOT 0.5 02/02/2021 0937   GFRNONAA 54 (L) 03/09/2020 0802   GFRNONAA 74 03/27/2012 0802   GFRAA 62 03/09/2020 0802   GFRAA 85 03/27/2012 0802     No results found.     Assessment & Plan:   1. Lymphedema Based on the patient's  description of symptoms it is likely that he is reddened areas are caused by lymphedema.  This is likely related to inflammation skin changes that occur during lymphedema.  We discussed conservative therapy including use of medical grade compression stockings, elevation of her lower extremities as well as activity.  We also discussed a lymphedema pump.  The patient feels that at this time the symptoms are fairly under good control.  Based on this we will forego any noninvasive testing at this time the patient follow-up with [place] on an as-needed basis symptoms worsen or change.     Current Outpatient Medications on File Prior to Visit  Medication Sig Dispense Refill   amLODipine (NORVASC) 2.5 MG tablet TAKE 1 TABLET BY MOUTH  DAILY 90 tablet 3   cetirizine (ZYRTEC) 5 MG tablet Take 5 mg by mouth daily.     esomeprazole (NEXIUM) 20 MG capsule Take 20 mg by mouth daily at 12 noon.     ezetimibe (ZETIA) 10 MG tablet TAKE 1 TABLET BY MOUTH  DAILY 90 tablet 3   famotidine (PEPCID) 20 MG tablet TAKE 1 TABLET BY MOUTH TWO  TIMES DAILY 180 tablet 1   FLUoxetine (PROZAC) 40 MG capsule TAKE 1 CAPSULE BY MOUTH  DAILY 90 capsule 3   furosemide (LASIX) 20 MG tablet Take 1 tablet (20 mg total) by mouth daily. As needed for fluid retention 30 tablet 0   Multiple Vitamin (MULTIVITAMIN) tablet Take 1 tablet by mouth daily.     olmesartan (BENICAR) 40 MG tablet Take 1 tablet (40 mg total) by mouth daily. 90 tablet 3   CLENPIQ 10-3.5-12 MG-GM -GM/160ML SOLN Take by mouth as directed.     mometasone (ELOCON) 0.1 % cream Apply 1 application topically daily. 45 g 0   moxifloxacin (VIGAMOX) 0.5 % ophthalmic solution Place 1 drop into the left eye 4 (four) times daily.     prednisoLONE acetate (PRED FORTE) 1 % ophthalmic suspension 1 drop 4 (four) times daily.     No current facility-administered medications on file prior to visit.    There are no Patient Instructions on file for this visit. No follow-ups on  file.   Arna Medici  Earley Favor, NP

## 2021-03-27 ENCOUNTER — Other Ambulatory Visit: Payer: Self-pay | Admitting: Internal Medicine

## 2021-04-04 LAB — HM MAMMOGRAPHY

## 2021-07-05 ENCOUNTER — Ambulatory Visit (INDEPENDENT_AMBULATORY_CARE_PROVIDER_SITE_OTHER): Payer: Managed Care, Other (non HMO) | Admitting: Dermatology

## 2021-07-05 DIAGNOSIS — D485 Neoplasm of uncertain behavior of skin: Secondary | ICD-10-CM

## 2021-07-05 DIAGNOSIS — L720 Epidermal cyst: Secondary | ICD-10-CM

## 2021-07-05 DIAGNOSIS — C44619 Basal cell carcinoma of skin of left upper limb, including shoulder: Secondary | ICD-10-CM

## 2021-07-05 DIAGNOSIS — L578 Other skin changes due to chronic exposure to nonionizing radiation: Secondary | ICD-10-CM

## 2021-07-05 DIAGNOSIS — L219 Seborrheic dermatitis, unspecified: Secondary | ICD-10-CM | POA: Diagnosis not present

## 2021-07-05 DIAGNOSIS — Z1283 Encounter for screening for malignant neoplasm of skin: Secondary | ICD-10-CM | POA: Diagnosis not present

## 2021-07-05 DIAGNOSIS — D692 Other nonthrombocytopenic purpura: Secondary | ICD-10-CM

## 2021-07-05 DIAGNOSIS — L814 Other melanin hyperpigmentation: Secondary | ICD-10-CM

## 2021-07-05 DIAGNOSIS — D2371 Other benign neoplasm of skin of right lower limb, including hip: Secondary | ICD-10-CM

## 2021-07-05 DIAGNOSIS — D229 Melanocytic nevi, unspecified: Secondary | ICD-10-CM

## 2021-07-05 DIAGNOSIS — D224 Melanocytic nevi of scalp and neck: Secondary | ICD-10-CM

## 2021-07-05 DIAGNOSIS — C4491 Basal cell carcinoma of skin, unspecified: Secondary | ICD-10-CM

## 2021-07-05 DIAGNOSIS — Z85828 Personal history of other malignant neoplasm of skin: Secondary | ICD-10-CM

## 2021-07-05 DIAGNOSIS — L821 Other seborrheic keratosis: Secondary | ICD-10-CM

## 2021-07-05 HISTORY — DX: Basal cell carcinoma of skin, unspecified: C44.91

## 2021-07-05 NOTE — Progress Notes (Signed)
? ?Follow-Up Visit ?  ?Subjective  ?Grace [name] is a 76 y.o. female who presents for the following: Annual Exam. ? ? ?The patient presents for Total-Body Skin Exam (TBSE) for skin cancer screening and mole check.  The patient has spots, moles and lesions to be evaluated, some may be new or changing. She has a couple of spots to check on her scalp and left forearm. She has a history of BCC of the face treated by Dr Sharlett Iles in the past.   ? ?The following portions of the chart were reviewed this encounter and updated as appropriate:  ?  ?  ? ?Review of Systems:  No other skin or systemic complaints except as noted in HPI or Assessment and Plan. ? ?Objective  ?Well appearing patient in no apparent distress; mood and affect are within normal limits. ? ?A full examination was performed including scalp, head, eyes, ears, nose, lips, neck, chest, axillae, abdomen, back, buttocks, bilateral upper extremities, bilateral lower extremities, hands, feet, fingers, toes, fingernails, and toenails. All findings within normal limits unless otherwise noted below. ? ?R sup vertex ?4.0 mm firm flesh papule ? ?Scalp, eyebrows ?Pink patches with greasy scale.  ? ?Left Forearm ?1.0 x 0.6 cm pink pearly papule ? ? ? ? ? ? ? ? ?Assessment & Plan  ?Skin cancer screening performed today. ? ?Actinic Damage ?- chronic, secondary to cumulative UV radiation exposure/sun exposure over time ?- diffuse scaly erythematous macules with underlying dyspigmentation ?- Recommend daily broad spectrum sunscreen SPF 30+ to sun-exposed areas, reapply every 2 hours as needed.  ?- Recommend staying in the shade or wearing long sleeves, sun glasses (UVA+UVB protection) and wide brim hats (4-inch brim around the entire circumference of the hat). ?- Call for new or changing lesions. ? ?History of Basal Cell Carcinoma of the Skin ?- No evidence of recurrence today of the face ?- Recommend regular full body skin exams ?- Recommend daily broad spectrum  sunscreen SPF 30+ to sun-exposed areas, reapply every 2 hours as needed.  ?- Call if any new or changing lesions are noted between office visits ? ?Seborrheic Keratoses ?- Stuck-on, waxy, tan-brown papules including ankles ?- Benign-appearing ?- Discussed benign etiology and prognosis. ?- Observe ?- Call for any changes ?-Recommend starting moisturizer with exfoliant (Urea, Salicylic acid, or Lactic acid) one to two times daily to help smooth rough and bumpy skin.  OTC options include Cetaphil Rough and Bumpy lotion (Urea), Eucerin Roughness Relief lotion or spot treatment cream (Urea), CeraVe SA lotion/cream for Rough and Bumpy skin (Sal Acid), Gold Bond Rough and Bumpy cream (Sal Acid), and AmLactin 12% lotion/cream (Lactic Acid).  If applying in morning, also apply sunscreen to sun-exposed areas, since these exfoliating moisturizers can increase sensitivity to sun. ? ?Milia ?- tiny firm white papule left zygoma ?- type of cyst ?- benign ?- may be extracted if symptomatic ?- observe ? ?Lentigines ?- Scattered tan macules ?- Due to sun exposure ?- Benign-appering, observe ?- Recommend daily broad spectrum sunscreen SPF 30+ to sun-exposed areas, reapply every 2 hours as needed. ?- Call for any changes ? ?Nevus ?R sup vertex ? ?Benign-appearing.  Observation.  Call clinic for new or changing moles.  Recommend daily use of broad spectrum spf 30+ sunscreen to sun-exposed areas.  ? ?Seborrheic dermatitis ?Scalp, eyebrows ? ?Seborrheic Dermatitis  ?-  is a chronic persistent rash characterized by pinkness and scaling most commonly of the mid face but also can occur on the scalp (dandruff), ears; mid  chest, mid back and groin.  It tends to be exacerbated by stress and cooler weather.  People who have neurologic disease may experience new onset or exacerbation of existing seborrheic dermatitis.  The condition is not curable but treatable and can be controlled. ? ?Pt declines Rx  ?Start 1% hydrocortisone cream to eyebrows  qd/bid prn flares. ?Start Scalpicin to scalp as needed for itch/flakes ?Continue Head & Shoulders shampoo - massage into scalp and let sit several minutes before rinsing.  ? ? ?Neoplasm of uncertain behavior of skin ?Left Forearm ? ?Skin / nail biopsy ?Type of biopsy: tangential   ?Informed consent: discussed and consent obtained   ?Patient was prepped and draped in usual sterile fashion: Area prepped with alcohol. ?Anesthesia: the lesion was anesthetized in a standard fashion   ?Anesthetic:  1% lidocaine w/ epinephrine 1-100,000 buffered w/ 8.4% NaHCO3 ?Instrument used: flexible razor blade   ?Hemostasis achieved with: pressure, aluminum chloride and electrodesiccation   ?Outcome: patient tolerated procedure well   ?Post-procedure details: wound care instructions given   ?Post-procedure details comment:  Ointment and small bandage applied ? ?Destruction of lesion ? ?Destruction method: electrodesiccation and curettage   ?Informed consent: discussed and consent obtained   ?Timeout:  patient name, date of birth, surgical site, and procedure verified ?Anesthesia: the lesion was anesthetized in a standard fashion   ?Anesthetic:  1% lidocaine w/ epinephrine 1-100,000 local infiltration ?Curettage performed in three different directions: Yes   ?Electrodesiccation performed over the curetted area: Yes   ?Final wound size (cm):  1.1 ?Hemostasis achieved with:  pressure, aluminum chloride and electrodesiccation ?Outcome: patient tolerated procedure well with no complications   ?Post-procedure details: wound care instructions given   ?Post-procedure details comment:  Ointment and bandage applied. ? ?Related Procedures ?Anatomic Pathology Report ? ?Melanocytic Nevi ?- Tan-brown and/or pink-flesh-colored symmetric macules and papules, including face ?- Benign appearing on exam today ?- Observation ?- Call clinic for new or changing moles ?- Recommend daily use of broad spectrum spf 30+ sunscreen to sun-exposed areas.   ? ?Dermatofibroma ?- Firm pink/brown papulenodule with dimple sign, right inferior knee ?- Benign appearing ?- Call for any changes ? ?Purpura - Chronic; persistent and recurrent.  Treatable, but not curable. ?- Violaceous macules and patches ?- Benign ?- Related to trauma, age, sun damage and/or use of blood thinners, chronic use of topical and/or oral steroids ?- Observe ?- Can use OTC arnica containing moisturizer such as Dermend Bruise Formula if desired ?- Call for worsening or other concerns ? ?Return in about 6 months (around 01/04/2022) for Hx BCC, f/u bx. ? ?I, Jamesetta Orleans, CMA, am acting as scribe for Brendolyn Patty, MD . ?Documentation: I have reviewed the above documentation for accuracy and completeness, and I agree with the above. ? ?Brendolyn Patty MD  ? ? ?

## 2021-07-05 NOTE — Patient Instructions (Addendum)
Seborrheic Dermatitis  ?-  is a chronic persistent rash characterized by pinkness and scaling most commonly of the mid face but also can occur on the scalp (dandruff), ears; mid chest, mid back and groin.  It tends to be exacerbated by stress and cooler weather.  People who have neurologic disease may experience new onset or exacerbation of existing seborrheic dermatitis.  The condition is not curable but treatable and can be controlled. ? ? ?Start 1% hydrocortisone cream to eyebrows one to two times a day for scaling/pinkness. ?Start Scalpicin to scalp as needed for itch/flakes ?Continue Head & Shoulders shampoo - massage into scalp and let sit several minutes before rinsing.  ? ? ?Recommend starting moisturizer with exfoliant (Urea, Salicylic acid, or Lactic acid) one to two times daily to help smooth rough and bumpy skin.  OTC options include Cetaphil Rough and Bumpy lotion (Urea), Eucerin Roughness Relief lotion or spot treatment cream (Urea), CeraVe SA lotion/cream for Rough and Bumpy skin (Sal Acid), Gold Bond Rough and Bumpy cream (Sal Acid), and AmLactin 12% lotion/cream (Lactic Acid).  If applying in morning, also apply sunscreen to sun-exposed areas, since these exfoliating moisturizers can increase sensitivity to sun. ? ? ?Recommend Salicylic acid Patches to thick areas on feet. ? ?Wound Care Instructions ? ?Cleanse wound gently with soap and water once a day then pat dry with clean gauze. Apply a thing coat of Petrolatum (petroleum jelly, "Vaseline") over the wound (unless you have an allergy to this). We recommend that you use a new, sterile tube of Vaseline. Do not pick or remove scabs. Do not remove the yellow or white "healing tissue" from the base of the wound. ? ?Cover the wound with fresh, clean, nonstick gauze and secure with paper tape. You may use Band-Aids in place of gauze and tape if the would is small enough, but would recommend trimming much of the tape off as there is often too much.  Sometimes Band-Aids can irritate the skin. ? ?You should call the office for your biopsy report after 1 week if you have not already been contacted. ? ?If you experience any problems, such as abnormal amounts of bleeding, swelling, significant bruising, significant pain, or evidence of infection, please call the office immediately. ? ?FOR ADULT SURGERY PATIENTS: If you need something for pain relief you may take 1 extra strength Tylenol (acetaminophen) AND 2 Ibuprofen ('200mg'$  each) together every 4 hours as needed for pain. (do not take these if you are allergic to them or if you have a reason you should not take them.) Typically, you may only need pain medication for 1 to 3 days.  ? ? ?If You Need Anything After Your Visit ? ?If you have any questions or concerns for your doctor, please call our main line at [phone] and press option 4 to reach your doctor's medical assistant. If no one answers, please leave a voicemail as directed and we will return your call as soon as possible. Messages left after 4 pm will be answered the following business day.  ? ?You may also send [place] a message via MyChart. We typically respond to MyChart messages within 1-2 business days. ? ?For prescription refills, please ask your pharmacy to contact our office. Our fax number is [phone]. ? ?If you have an urgent issue when the clinic is closed that cannot wait until the next business day, you can page your doctor at the number below.   ? ?Please note that while we do our best to  be available for urgent issues outside of office hours, we are not available 24/7.  ? ?If you have an urgent issue and are unable to reach [place], you may choose to seek medical care at your doctor's office, retail clinic, urgent care center, or emergency room. ? ?If you have a medical emergency, please immediately call 911 or go to the emergency department. ? ?Pager Numbers ? ?- Dr. Nehemiah Massed: [phone] ? ?- Dr. Laurence Ferrari: [phone] ? ?- Dr. Nicole Kindred:  [phone] ? ?In the event of inclement weather, please call our main line at [phone] for an update on the status of any delays or closures. ? ?Dermatology Medication Tips: ?Please keep the boxes that topical medications come in in order to help keep track of the instructions about where and how to use these. Pharmacies typically print the medication instructions only on the boxes and not directly on the medication tubes.  ? ?If your medication is too expensive, please contact our office at [phone] option 4 or send [place] a message through [place].  ? ?We are unable to tell what your co-pay for medications will be in advance as this is different depending on your insurance coverage. However, we may be able to find a substitute medication at lower cost or fill out paperwork to get insurance to cover a needed medication.  ? ?If a prior authorization is required to get your medication covered by your insurance company, please allow [place] 1-2 business days to complete this process. ? ?Drug prices often vary depending on where the prescription is filled and some pharmacies may offer cheaper prices. ? ?The website www.goodrx.com contains coupons for medications through different pharmacies. The prices here do not account for what the cost may be with help from insurance (it may be cheaper with your insurance), but the website can give you the price if you did not use any insurance.  ?- You can print the associated coupon and take it with your prescription to the pharmacy.  ?- You may also stop by our office during regular business hours and pick up a GoodRx coupon card.  ?- If you need your prescription sent electronically to a different pharmacy, notify our office through [facility] or by phone at [phone] option 4. ? ? ? ? ?Si Usted Necesita Algo Despu?s de Su Visita ? ?Tambi?n puede enviarnos un mensaje a trav?s de MyChart. Por lo general respondemos a los mensajes de MyChart en el transcurso de 1 a 2  d?as h?biles. ? ?Para renovar recetas, por favor pida a su farmacia que se ponga en contacto con nuestra oficina. Nuestro n?mero de fax es el [phone]. ? ?Si tiene un asunto urgente cuando la cl?nica est? cerrada y que no puede esperar hasta el siguiente d?a h?bil, puede llamar/localizar a su doctor(a) al n?mero que aparece a continuaci?n.  ? ?Por favor, tenga en cuenta que aunque hacemos todo lo posible para estar disponibles para asuntos urgentes fuera del horario de oficina, no estamos disponibles las 24 horas del d?a, los 7 d?as de la semana.  ? ?Si tiene un problema urgente y no puede comunicarse con nosotros, puede optar por buscar atenci?n m?dica  en el consultorio de su doctor(a), en una cl?nica privada, en un centro de atenci?n urgente o en una sala de emergencias. ? ?Si tiene Engineer, maintenance (IT) m?dica, por favor llame inmediatamente al 911 o vaya a la sala de emergencias. ? ?N?meros de b?per ? ?- Dr. Nehemiah Massed: [phone] ? ?- Dra. Moye: [phone] ? ?- Dra.  Nicole Kindred: [phone] ? ?En caso de inclemencias del tiempo, por favor llame a nuestra l?nea principal al [phone] para una actualizaci?n sobre el estado de cualquier retraso o cierre. ? ?Consejos para la medicaci?n en dermatolog?a: ?Por favor, guarde las cajas en las que vienen los medicamentos de uso t?pico para ayudarle a seguir las instrucciones sobre d?nde y c?mo usarlos. Las farmacias generalmente imprimen las instrucciones del medicamento s?lo en las cajas y no directamente en los tubos del [place].  ? ?Si su medicamento es muy caro, por favor, p?ngase en contacto con Zigmund Daniel llamando al [phone] y presione la opci?n 4 o env?enos un mensaje a trav?s de MyChart.  ? ?No podemos decirle cu?l ser? su copago por los medicamentos por adelantado ya que esto es diferente dependiendo de la cobertura de su seguro. Sin embargo, es posible que podamos encontrar un medicamento sustituto a Electrical engineer un formulario para que el  seguro cubra el medicamento que se considera necesario.  ? ?Si se requiere Ardelia Mems autorizaci?n previa para que su compa??a de seguros [place] su medicamento, por favor perm?tanos de 1 a 2 d?as h?biles para completar

## 2021-07-11 ENCOUNTER — Telehealth: Payer: Self-pay

## 2021-07-11 LAB — ANATOMIC PATHOLOGY REPORT

## 2021-07-11 NOTE — Telephone Encounter (Signed)
Left pt message to call for bx result/sh 

## 2021-07-11 NOTE — Telephone Encounter (Signed)
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  ? - please call patient ?

## 2021-07-11 NOTE — Telephone Encounter (Signed)
Patient advised of BX results .aw 

## 2021-08-03 ENCOUNTER — Other Ambulatory Visit: Payer: Self-pay

## 2021-08-03 ENCOUNTER — Ambulatory Visit: Payer: Managed Care, Other (non HMO) | Admitting: Internal Medicine

## 2021-08-03 ENCOUNTER — Encounter: Payer: Self-pay | Admitting: Internal Medicine

## 2021-08-03 VITALS — BP 124/88 | HR 55 | Temp 98.1°F | Ht 65.0 in | Wt 148.4 lb

## 2021-08-03 DIAGNOSIS — J301 Allergic rhinitis due to pollen: Secondary | ICD-10-CM | POA: Diagnosis not present

## 2021-08-03 DIAGNOSIS — E7800 Pure hypercholesterolemia, unspecified: Secondary | ICD-10-CM

## 2021-08-03 DIAGNOSIS — G252 Other specified forms of tremor: Secondary | ICD-10-CM

## 2021-08-03 DIAGNOSIS — K5909 Other constipation: Secondary | ICD-10-CM | POA: Diagnosis not present

## 2021-08-03 DIAGNOSIS — N182 Chronic kidney disease, stage 2 (mild): Secondary | ICD-10-CM | POA: Diagnosis not present

## 2021-08-03 DIAGNOSIS — I890 Lymphedema, not elsewhere classified: Secondary | ICD-10-CM

## 2021-08-03 MED ORDER — DOXYCYCLINE HYCLATE 100 MG PO TABS: 100.0000 mg | ORAL_TABLET | Freq: Two times a day (BID) | ORAL | 0 refills | Status: DC

## 2021-08-03 MED ORDER — TETANUS-DIPHTH-ACELL PERTUSSIS 5-2.5-18.5 LF-MCG/0.5 IM SUSY: 0.5000 mL | PREFILLED_SYRINGE | Freq: Once | INTRAMUSCULAR | 0 refills | Status: AC

## 2021-08-03 NOTE — Progress Notes (Signed)
Subjective:  Patient ID: Grace [name], female    DOB: 1945-03-27  Age: 76 y.o. MRN: [id number]  CC: The primary encounter diagnosis was Chronic kidney disease (CKD) stage G2/A2, mildly decreased glomerular filtration rate (GFR) between 60-89 mL/min/1.73 square meter and albuminuria creatinine ratio between 30-299 mg/g. Diagnoses of Pure hypercholesterolemia, Other constipation, Allergic rhinitis due to pollen, unspecified seasonality, Lymphedema, and Intention tremor were also pertinent to this visit.   HPI Grace [name] presents for follow up on hypertension , CKD and hyperlipidemia  Chief Complaint  Patient presents with   Follow-up   Still working full time for [employer] for 34-35 years.  Thinking of retiring ; can't work part time in her job  Occasional fecal streaking .  Has seen Grace [name] for colonoscopy last May .  No more per Grace [name]  Some urinary incompltee emptying,  no hesitation ,  occasional 2 voids per night,   Intention Tremor of both hands.  Does not want surgery or treatment.  Affects her handwriting   HTN/CKD:  reviewed Dr Grace [name] noted from June.  HTN regimen is optimized   Lymphedema advised to wear compression stockings by Vascular Surgery . Not wearing due to inconvenience sing amlodipine prn   Outpatient Medications Prior to Visit  Medication Sig Dispense Refill   amLODipine (NORVASC) 2.5 MG tablet TAKE 1 TABLET BY MOUTH  DAILY 90 tablet 3   cetirizine (ZYRTEC) 5 MG tablet Take 5 mg by mouth daily.     esomeprazole (NEXIUM) 20 MG capsule Take 20 mg by mouth daily at 12 noon.     ezetimibe (ZETIA) 10 MG tablet TAKE 1 TABLET BY MOUTH  DAILY 90 tablet 3   famotidine (PEPCID) 20 MG tablet TAKE 1 TABLET BY MOUTH TWO  TIMES DAILY 180 tablet 1   FLUoxetine (PROZAC) 40 MG capsule TAKE 1 CAPSULE BY MOUTH  DAILY 90 capsule 3   furosemide (LASIX) 20 MG tablet Take 1 tablet (20 mg total) by mouth daily. As needed for fluid retention 30 tablet 0   Multiple Vitamin  (MULTIVITAMIN) tablet Take 1 tablet by mouth daily.     olmesartan (BENICAR) 40 MG tablet TAKE 1 TABLET BY MOUTH  DAILY 90 tablet 3   CLENPIQ 10-3.5-12 MG-GM -GM/160ML SOLN Take by mouth as directed.     mometasone (ELOCON) 0.1 % cream Apply 1 application topically daily. 45 g 0   moxifloxacin (VIGAMOX) 0.5 % ophthalmic solution Place 1 drop into the left eye 4 (four) times daily.     prednisoLONE acetate (PRED FORTE) 1 % ophthalmic suspension 1 drop 4 (four) times daily.     No facility-administered medications prior to visit.    Review of Systems;  Patient denies headache, fevers, malaise, unintentional weight loss, skin rash, eye pain, sinus congestion and sinus pain, sore throat, dysphagia,  hemoptysis , cough, dyspnea, wheezing, chest pain, palpitations, orthopnea, edema, abdominal pain, nausea, melena, diarrhea, constipation, flank pain, dysuria, hematuria, urinary  Frequency, nocturia, numbness, tingling, seizures,  Focal weakness, Loss of consciousness,  Tremor, insomnia, depression, anxiety, and suicidal ideation.      Objective:  BP 124/88 (BP Location: Left Arm, Patient Position: Sitting, Cuff Size: Normal)   Pulse (!) 55   Temp 98.1 F (36.7 C) (Oral)   Ht '5\' 5"'$  (1.651 m)   Wt 148 lb 6.4 oz (67.3 kg)   SpO2 95%   BMI 24.70 kg/m   BP Readings from Last 3 Encounters:  08/03/21 124/88  02/28/21 (!) 176/98  02/02/21  124/82    Wt Readings from Last 3 Encounters:  08/03/21 148 lb 6.4 oz (67.3 kg)  02/28/21 158 lb 3.2 oz (71.8 kg)  02/02/21 159 lb 9.6 oz (72.4 kg)    General appearance: alert, cooperative and appears stated age Ears: normal TM's and external ear canals both ears Throat: lips, mucosa, and tongue normal; teeth and gums normal Neck: no adenopathy, no carotid bruit, supple, symmetrical, trachea midline and thyroid not enlarged, symmetric, no tenderness/mass/nodules Back: symmetric, no curvature. ROM normal. No CVA tenderness. Lungs: clear to auscultation  bilaterally Heart: regular rate and rhythm, S1, S2 normal, no murmur, click, rub or gallop Abdomen: soft, non-tender; bowel sounds normal; no masses,  no organomegaly Pulses: 2+ and symmetric Skin: Skin color, texture, turgor normal. No rashes or lesions Lymph nodes: Cervical, supraclavicular, and axillary nodes normal.  No results found for: HGBA1C  Lab Results  Component Value Date   CREATININE 0.99 02/02/2021   CREATININE 1.03 (H) 08/02/2020   CREATININE 1.04 (H) 03/09/2020    Lab Results  Component Value Date   WBC 7.6 02/02/2021   HGB 14.0 02/02/2021   HCT 40.7 02/02/2021   PLT 253 02/02/2021   GLUCOSE 93 02/02/2021   CHOL 217 (H) 02/02/2021   TRIG 109 02/02/2021   HDL 67 02/02/2021   LDLDIRECT 130 (H) 12/16/2015   LDLCALC 131 (H) 02/02/2021   ALT 17 02/02/2021   AST 19 02/02/2021   NA 141 02/02/2021   K 4.4 02/02/2021   CL 102 02/02/2021   CREATININE 0.99 02/02/2021   BUN 10 02/02/2021   CO2 25 02/02/2021   TSH 2.450 02/02/2020    DG Bone Density  Result Date: 10/21/2020 EXAM: DUAL X-RAY ABSORPTIOMETRY (DXA) FOR BONE MINERAL DENSITY IMPRESSION: Dear Dr. Derrel [name], Your patient Grace [name] completed a FRAX assessment on 10/21/2020 using the [place] (analysis version: 14.10) manufactured by [employer]. The following summarizes the results of our evaluation. PATIENT BIOGRAPHICAL: Name: Grace, [name] Patient ID: [id number] Birth Date: August 14, 1945 Height:    65.0 in. Gender:     Female    [age]       Weight:    165.8 lbs. Ethnicity:  White                            Exam Date: 10/21/2020 FRAX* RESULTS:  (version: 3.5) 10-year Probability of Fracture1 Major Osteoporotic Fracture2 Hip Fracture 15.4% 2.2% Population: [place] (Caucasian) Risk Factors: History of Fracture (Adult) Based on Femur (Right) Neck BMD 1 -The 10-year probability of fracture may be lower than reported if the patient has received treatment. 2 -Major Osteoporotic Fracture:  Clinical Spine, Forearm, Hip or Shoulder *FRAX is a Materials engineer of the [employer] for Metabolic Bone Disease, a [place] (WHO) [facility]. ASSESSMENT: The probability of a major osteoporotic fracture is 15.4% within the next ten years. The probability of a hip fracture is 2.2% within the next ten years. . Your patient Alfreida Steffenhagen completed a BMD test on 10/21/2020 using the [place] (software version: 14.10) manufactured by [employer]. The following summarizes the results of our evaluation. Technologist: [facility] PATIENT BIOGRAPHICAL: Name: Winnifred, Dufford Patient ID: [id number] Birth Date: 1946-01-22 Height: 65.0 in. Gender: Female Exam Date: 10/21/2020 Weight: 165.8 lbs. Indications: Advanced Age, Caucasian, History of Fracture (Adult), Hysterectomy, Oophorectomy Bilateral, Postmenopausal Fractures: Right forearm  Treatments: Calcium, Vitamin D DENSITOMETRY RESULTS: Site         Region      Measured Date Measured Age WHO Classification Young Adult T-score BMD         %Change vs. Previous Significant Change (*) DualFemur Total Right 10/21/2020 73.5 Osteopenia -1.3 0.848 g/cm2 -23.7% Yes DualFemur Total Right 12/17/2007 60.6 Normal 0.8 1.111 g/cm2 - - DualFemur Total Mean 10/21/2020 73.5 Normal -0.8 0.911 g/cm2 -21.2% Yes DualFemur Total Mean 12/17/2007 60.6 Normal 1.2 1.156 g/cm2 - - Left Forearm Radius 33% 10/21/2020 73.5 Normal -0.7 0.816 g/cm2 -7.9% Yes Left Forearm Radius 33% 12/17/2007 60.6 Normal 0.1 0.886 g/cm2 - - ASSESSMENT: The BMD measured at Femur Total Right is 0.848 g/cm2 with a T-score of -1.3. This patient is considered osteopenic according to [facility]) criteria. The scan quality is good. Lumbar spine was not utilized due to advanced degenerative changes. Compared with prior study, there has been significant decrease in the total hip. World Pharmacologist [facility]) criteria for  post-menopausal, Caucasian Women: Normal:                   T-score at or above -1 SD Osteopenia/low bone mass: T-score between -1 and -2.5 SD Osteoporosis:             T-score at or below -2.5 SD RECOMMENDATIONS: 1. All patients should optimize calcium and vitamin D intake. 2. Consider FDA-approved medical therapies in postmenopausal women and men aged 72 years and older, based on the following: a. A hip or vertebral(clinical or morphometric) fracture b. T-score < -2.5 at the femoral neck or spine after appropriate evaluation to exclude secondary causes c. Low bone mass (T-score between -1.0 and -2.5 at the femoral neck or spine) and a 10-year probability of a hip fracture > 3% or a 10-year probability of a major osteoporosis-related fracture > 20% based on the [place]-adapted WHO algorithm 3. Clinician judgment and/or patient preferences may indicate treatment for people with 10-year fracture probabilities above or below these levels FOLLOW-UP: People with diagnosed cases of osteoporosis or at high risk for fracture should have regular bone mineral density tests. For patients eligible for Medicare, routine testing is allowed once every 2 years. The testing frequency can be increased to one year for patients who have rapidly progressing disease, those who are receiving or discontinuing medical therapy to restore bone mass, or have additional risk factors. I have reviewed this report, and agree with the above findings. Mark A. Thornton Papas, M.D. [facility] Electronically Signed   By: Lavonia Dana M.D.   On: 10/21/2020 15:27    Assessment & Plan:   Problem List Items Addressed This Visit     Allergic rhinitis due to pollen    Managed with nightly zyrtec. Advised to try zyzal given new onset urinary retention (mild)       Chronic kidney disease (CKD) stage G2/A2, mildly decreased glomerular filtration rate (GFR) between 60-89 mL/min/1.73 square meter and albuminuria creatinine ratio between 30-299 mg/g  - Primary    Continue olmesartan and annual follow up with [facility] .  She is avoiding NSAIDS       Relevant Orders   Comprehensive metabolic panel   Constipation   Relevant Orders   TSH   Hyperlipidemia    Managed with Zetia.  She is Statin intolerant due to myalgias.  Repeat level is due        Relevant Orders   Lipid panel   Intention tremor  Reported by patient today.  Affecting her handwriting mostly       Lymphedema    She is not using compression stockings due to inconvenience.  continue prn lasix (ok per nephrology)        I spent a total of 30  minutes with this patient in a face to face visit on the date of this encounter reviewing the last office visit with her neprhologist in June 2022,  her vascular evaluation for lymphedema in December ,  home blood pressure readings ,  most recent  mammogram ,   and post visit ordering of testing and therapeutics.    Follow-up: No follow-ups on file.   Crecencio Mc, MD

## 2021-08-03 NOTE — Patient Instructions (Addendum)
Try Zyzal  for a week or two  instead of Zyrtec to  see if the urinary  retention clears  up   Your blood pressure regimen has been "optimized":  no changes today   I  recommend that you get yourTDaP vaccine at [facility] .  It is good for ten years for protection against tetanus,  And for lifetime protection against  diptheria and whooping cough     [facility] Spotted Fever  can present with fever and a headache, NOT a rash; the occurrence of these symptoms during spring/summer/fall in the context of recent tick exposure is RMSF until proven otherwise and should be treated immediately with doxycycline.  I have sent this rx to your pharmacy to use if you develop these symptoms.  Let me know if this occurs so we can set you up for the appropriate testing .

## 2021-08-03 NOTE — Assessment & Plan Note (Signed)
Managed with Zetia.  She is Statin intolerant due to myalgias.  Repeat level is due

## 2021-08-03 NOTE — Assessment & Plan Note (Signed)
She is not using compression stockings due to inconvenience.  continue prn lasix (ok per nephrology)

## 2021-08-03 NOTE — Assessment & Plan Note (Signed)
Reported by patient today.  Affecting her handwriting mostly

## 2021-08-03 NOTE — Assessment & Plan Note (Signed)
Continue olmesartan and annual follow up with [facility] .  She is avoiding NSAIDS

## 2021-08-03 NOTE — Addendum Note (Signed)
Addended by: Leeanne Rio on: 08/03/2021 09:16 AM   Modules accepted: Orders

## 2021-08-03 NOTE — Assessment & Plan Note (Signed)
Managed with nightly zyrtec. Advised to try zyzal given new onset urinary retention (mild)

## 2021-08-04 LAB — COMPREHENSIVE METABOLIC PANEL
ALT: 14 IU/L (ref 0–32)
AST: 15 IU/L (ref 0–40)
Albumin/Globulin Ratio: 2 (ref 1.2–2.2)
Albumin: 4.3 g/dL (ref 3.7–4.7)
Alkaline Phosphatase: 50 IU/L (ref 44–121)
BUN/Creatinine Ratio: 14 (ref 12–28)
BUN: 13 mg/dL (ref 8–27)
Bilirubin Total: 0.4 mg/dL (ref 0.0–1.2)
CO2: 23 mmol/L (ref 20–29)
Calcium: 9.2 mg/dL (ref 8.7–10.3)
Chloride: 104 mmol/L (ref 96–106)
Creatinine, Ser: 0.95 mg/dL (ref 0.57–1.00)
Globulin, Total: 2.1 g/dL (ref 1.5–4.5)
Glucose: 85 mg/dL (ref 70–99)
Potassium: 4.5 mmol/L (ref 3.5–5.2)
Sodium: 141 mmol/L (ref 134–144)
Total Protein: 6.4 g/dL (ref 6.0–8.5)
eGFR: 63 mL/min/{1.73_m2} (ref >59)

## 2021-08-04 LAB — TSH: TSH: 1.87 u[IU]/mL (ref 0.450–4.500)

## 2021-08-04 LAB — LIPID PANEL
Chol/HDL Ratio: 2.9 ratio (ref 0.0–4.4)
Cholesterol, Total: 186 mg/dL (ref 100–199)
HDL: 64 mg/dL (ref >39)
LDL Chol Calc (NIH): 104 mg/dL — ABNORMAL HIGH (ref 0–99)
Triglycerides: 101 mg/dL (ref 0–149)
VLDL Cholesterol Cal: 18 mg/dL (ref 5–40)

## 2021-10-10 ENCOUNTER — Ambulatory Visit: Payer: Managed Care, Other (non HMO) | Admitting: Dermatology

## 2021-10-10 DIAGNOSIS — D224 Melanocytic nevi of scalp and neck: Secondary | ICD-10-CM | POA: Diagnosis not present

## 2021-10-10 DIAGNOSIS — L820 Inflamed seborrheic keratosis: Secondary | ICD-10-CM

## 2021-10-10 DIAGNOSIS — L219 Seborrheic dermatitis, unspecified: Secondary | ICD-10-CM | POA: Diagnosis not present

## 2021-10-10 DIAGNOSIS — D229 Melanocytic nevi, unspecified: Secondary | ICD-10-CM

## 2021-10-10 DIAGNOSIS — L821 Other seborrheic keratosis: Secondary | ICD-10-CM | POA: Diagnosis not present

## 2021-10-10 NOTE — Progress Notes (Signed)
   Follow-Up Visit   Subjective  Grace [name] is a 76 y.o. female who presents for the following: check spot (Scalp, ~2wks, itchy at times).   The following portions of the chart were reviewed this encounter and updated as appropriate:       Review of Systems:  No other skin or systemic complaints except as noted in HPI or Assessment and Plan.  Objective  Well appearing patient in no apparent distress; mood and affect are within normal limits.  A focused examination was performed including scalp. Relevant physical exam findings are noted in the Assessment and Plan.  L crown scalp x 1 Stuck on waxy paps with erythema  Vertex scalp  Vertex scalp 4.50m firm flesh pap  Scalp Mild scaling scalp    Assessment & Plan   Seborrheic Keratoses - Stuck-on, waxy, tan-brown papules and/or plaques  - Benign-appearing - Discussed benign etiology and prognosis. - Observe - Call for any changes  Inflamed seborrheic keratosis L crown scalp x 1  Symptomatic, irritating, patient would like treated.   Destruction of lesion - L crown scalp x 1  Destruction method: cryotherapy   Informed consent: discussed and consent obtained   Lesion destroyed using liquid nitrogen: Yes   Region frozen until ice ball extended beyond lesion: Yes   Outcome: patient tolerated procedure well with no complications   Post-procedure details: wound care instructions given   Additional details:  Prior to procedure, discussed risks of blister formation, small wound, skin dyspigmentation, or rare scar following cryotherapy. Recommend Vaseline ointment to treated areas while healing.   Nevus Vertex scalp  Benign-appearing.  Observation.  Call clinic for new or changing moles.  Recommend daily use of broad spectrum spf 30+ sunscreen to sun-exposed areas.    Seborrheic dermatitis Scalp  Seborrheic Dermatitis  -  is a chronic persistent rash characterized by pinkness and scaling most commonly of the mid  face but also can occur on the scalp (dandruff), ears; mid chest, mid back and groin.  It tends to be exacerbated by stress and cooler weather.  People who have neurologic disease may experience new onset or exacerbation of existing seborrheic dermatitis.  The condition is not curable but treatable and can be controlled.  Cont Head and Shoulders q shampoo, let sit several minutes and rinse out OTC HC 1% cream to eyebrows qd/bid prn flares   Return for as scheduled.  I, SOthelia Pulling RMA, am acting as scribe for TBrendolyn Patty MD .  Documentation: I have reviewed the above documentation for accuracy and completeness, and I agree with the above.  TBrendolyn PattyMD

## 2021-10-10 NOTE — Patient Instructions (Addendum)
Cryotherapy Aftercare  Wash gently with soap and water everyday.   Apply Vaseline and Band-Aid daily until healed.   Seborrheic Keratosis  What causes seborrheic keratoses? Seborrheic keratoses are harmless, common skin growths that first appear during adult life.  As time goes by, more growths appear.  Some people may develop a large number of them.  Seborrheic keratoses appear on both covered and uncovered body parts.  They are not caused by sunlight.  The tendency to develop seborrheic keratoses can be inherited.  They vary in color from skin-colored to gray, brown, or even black.  They can be either smooth or have a rough, warty surface.   Seborrheic keratoses are superficial and look as if they were stuck on the skin.  Under the microscope this type of keratosis looks like layers upon layers of skin.  That is why at times the top layer may seem to fall off, but the rest of the growth remains and re-grows.    Treatment Seborrheic keratoses do not need to be treated, but can easily be removed in the office.  Seborrheic keratoses often cause symptoms when they rub on clothing or jewelry.  Lesions can be in the way of shaving.  If they become inflamed, they can cause itching, soreness, or burning.  Removal of a seborrheic keratosis can be accomplished by freezing, burning, or surgery. If any spot bleeds, scabs, or grows rapidly, please return to have it checked, as these can be an indication of a skin cancer.    Seborrheic Dermatitis  What is seborrheic dermatitis? Seborrheic (say: seb-oh-ree-ick) dermatitis is a disease that causes flaking of the skin.  It usually affects the scalp.  In teenagers and adults, it is commonly called "dandruff".  In infants, it is referred to as "cradle cap".  Dandruff often appears as scaling on the scalp with or without redness.  On other parts of the body, seborrheic dermatitis tends to produce both redness and scaling.  Other common locations of seborrheic  dermatitis include the central face, eyebrows, chest, and the creases of the arms, legs, and groin.  It often causes the skin to look a little greasy, scaly, or flaky. Seborrheic dermatitis can occur at any age.  It often comes and goes and may to be seasonally related, especially in the Northern climates.  What causes seborrheic dermatitis? The exact cause is not known, though yeast of the Malassezia species may be involved.  This organism is normally present on the skin in small numbers, but sometimes its numbers increase, especially in oily skin.  Treatments that reduce the yeast tend to improve seborrheic dermatitis.  How is seborrheic dermatitis treated? The treatment of seborrheic dermatitis depends on its location on the body and the person's age. Seborrheic dermatitis of the scalp (dandruff) in adults and teenagers is usually treated with a medicated shampoo.  Here is a list of the medications that help, and the over-counter shampoos that contain them: Salicylic acid (Neutrogena T/Sal, Sebulex, Scalpicin, Denorex Extra Strength) Zinc pyrithione (Head & Shoulders white bottle, Denorex Daily, DHS Zinc, Pantene Pro-V Pyrithione Zinc) Selenium sulfide (Head & Shoulders blue bottle, Selsun Blue, Exsel Lotion Shampoo, Glo-Sel) Yahoo tar (Neutrogena T/Gal, Pentrax, Zetar, Tegrin, Viacom, Therapeutic Denorex) Ketoconazole (Nizoral)  If you have dandruff, you might start by using one of these shampoos every day until your dandruff is controlled and then keep using it at least twice a week.  Often times your doctor will recommend a rotation of several different medicated shampoos  as some will experience a plateau in the effectiveness of any one shampoo.   When you use a dandruff shampoo, rub the shampoo into your wet hair and massage into scalp thoroughly.  Let it stay on your hair and scalp for 5 minutes before rinsing.  If you have involvement in the eyebrows or face, you can lather those areas with  the medicated shampoo as well, or use a medicated soap (ZNP-bar, Polytar Soap, SAStid, or sulfur soap).    If the wash or shampoo alone does not help, your doctor might want you to use a prescription medication once or twice a day.  Leave-in medications for the scalp are best applied by massaging into the scalp immediately after towel drying your hair, but may be applied even if you have not washed your hair.  Seborrheic dermatitis in infants usually clears up by age 58 -71 months.  It may develop in the diaper area where it might be confused with diaper rash.  For milder cases you can try gently brushing out scales with a soft brush.  This is best done immediately after washing with a non-medicated baby shampoo Wynetta Emery and Royce Macadamia, etc.).  Your doctor may recommend a medicated shampoo or a prescription topical medication.      Due to recent changes in healthcare laws, you may see results of your pathology and/or laboratory studies on MyChart before the doctors have had a chance to review them. We understand that in some cases there may be results that are confusing or concerning to you. Please understand that not all results are received at the same time and often the doctors may need to interpret multiple results in order to provide you with the best plan of care or course of treatment. Therefore, we ask that you please give [place] 2 business days to thoroughly review all your results before contacting the office for clarification. Should we see a critical lab result, you will be contacted sooner.   If You Need Anything After Your Visit  If you have any questions or concerns for your doctor, please call our main line at [phone] and press option 4 to reach your doctor's medical assistant. If no one answers, please leave a voicemail as directed and we will return your call as soon as possible. Messages left after 4 pm will be answered the following business day.   You may also send [place] a  message via [place]. We typically respond to MyChart messages within 1-2 business days.  For prescription refills, please ask your pharmacy to contact our office. Our fax number is [phone].  If you have an urgent issue when the clinic is closed that cannot wait until the next business day, you can page your doctor at the number below.    Please note that while we do our best to be available for urgent issues outside of office hours, we are not available 24/7.   If you have an urgent issue and are unable to reach [place], you may choose to seek medical care at your doctor's office, retail clinic, urgent care center, or emergency room.  If you have a medical emergency, please immediately call 911 or go to the emergency department.  Pager Numbers  - Dr. Nehemiah Massed: [phone]  - Dr. Laurence Ferrari: [phone]  - Dr. Nicole Kindred: [phone]  In the event of inclement weather, please call our main line at [phone] for an update on the status of any delays or closures.  Dermatology Medication Tips: Please keep  the boxes that topical medications come in in order to help keep track of the instructions about where and how to use these. Pharmacies typically print the medication instructions only on the boxes and not directly on the medication tubes.   If your medication is too expensive, please contact our office at [phone] option 4 or send [place] a message through [place].   We are unable to tell what your co-pay for medications will be in advance as this is different depending on your insurance coverage. However, we may be able to find a substitute medication at lower cost or fill out paperwork to get insurance to cover a needed medication.   If a prior authorization is required to get your medication covered by your insurance company, please allow [place] 1-2 business days to complete this process.  Drug prices often vary depending on where the prescription is filled and some pharmacies may offer  cheaper prices.  The website www.goodrx.com contains coupons for medications through different pharmacies. The prices here do not account for what the cost may be with help from insurance (it may be cheaper with your insurance), but the website can give you the price if you did not use any insurance.  - You can print the associated coupon and take it with your prescription to the pharmacy.  - You may also stop by our office during regular business hours and pick up a GoodRx coupon card.  - If you need your prescription sent electronically to a different pharmacy, notify our office through [facility] or by phone at [phone] option 4.     Si Usted Necesita Algo Despus de Su Visita  Tambin puede enviarnos un mensaje a travs de Pharmacist, community. Por lo general respondemos a los mensajes de MyChart en el transcurso de 1 a 2 das hbiles.  Para renovar recetas, por favor pida a su farmacia que se ponga en contacto con nuestra oficina. Harland Dingwall de fax es [place] [phone].  Si tiene un asunto urgente cuando la clnica est cerrada y que no puede esperar hasta el siguiente da hbil, puede llamar/localizar a su doctor(a) al nmero que aparece a continuacin.   Por favor, tenga en cuenta que aunque hacemos todo lo posible para estar disponibles para asuntos urgentes fuera del horario de [place], no estamos disponibles las 24 horas del da, los 7 das de la [place].   Si tiene un problema urgente y no puede comunicarse con nosotros, puede optar por buscar atencin mdica  en el consultorio de su doctor(a), en una clnica privada, en un centro de atencin urgente o en una sala de emergencias.  Si tiene Engineering geologist, por favor llame inmediatamente al 911 o vaya a la sala de emergencias.  Nmeros de bper  - Dr. Nehemiah Massed: [phone]  - Dra. Moye: [phone]  - Dra. Nicole Kindred: [phone]  En caso de inclemencias del [place], por favor llame a Johnsie Kindred principal al  [phone] para una actualizacin sobre el [place] de cualquier retraso o cierre.  Consejos para la medicacin en dermatologa: Por favor, guarde las cajas en las que vienen los medicamentos de uso tpico para ayudarle a seguir las instrucciones sobre dnde y cmo usarlos. Las farmacias generalmente imprimen las instrucciones del medicamento slo en las cajas y no directamente en los tubos del [place].   Si su medicamento es muy caro, por favor, pngase en contacto con Zigmund Daniel llamando al [phone] y presione la opcin 4 o envenos un mensaje a travs de Pharmacist, community.  No podemos decirle cul ser su copago por los medicamentos por adelantado ya que esto es diferente dependiendo de la cobertura de su seguro. Sin embargo, es posible que podamos encontrar un medicamento sustituto a Electrical engineer un formulario para que el seguro cubra el medicamento que se considera necesario.   Si se requiere una autorizacin previa para que su compaa de seguros [place] su medicamento, por favor permtanos de 1 a 2 das hbiles para completar este proceso.  Los precios de los medicamentos varan con frecuencia dependiendo del Environmental consultant de dnde se surte la receta y alguna farmacias pueden ofrecer precios ms baratos.  El sitio web www.goodrx.com tiene cupones para medicamentos de Airline pilot. Los precios aqu no tienen en cuenta lo que podra costar con la ayuda del seguro (puede ser ms barato con su seguro), pero el sitio web puede darle el precio si no utiliz Research scientist (physical sciences).  - Puede imprimir el cupn correspondiente y llevarlo con su receta a la farmacia.  - Tambin puede pasar por nuestra oficina durante el horario de atencin regular y Charity fundraiser una tarjeta de cupones de GoodRx.  - Si necesita que su receta se enve electrnicamente a una farmacia diferente, informe a nuestra oficina a travs de MyChart de  o por telfono llamando al [phone] y presione la opcin 4.

## 2022-01-09 ENCOUNTER — Encounter (INDEPENDENT_AMBULATORY_CARE_PROVIDER_SITE_OTHER): Payer: Self-pay

## 2022-01-10 ENCOUNTER — Ambulatory Visit: Payer: Managed Care, Other (non HMO) | Admitting: Dermatology

## 2022-01-11 ENCOUNTER — Ambulatory Visit: Payer: Managed Care, Other (non HMO) | Admitting: Dermatology

## 2022-01-14 ENCOUNTER — Telehealth: Payer: Self-pay | Admitting: Internal Medicine

## 2022-01-17 MED ORDER — OLMESARTAN MEDOXOMIL 40 MG PO TABS: 40.0000 mg | ORAL_TABLET | Freq: Every day | ORAL | 0 refills | Status: DC

## 2022-01-17 NOTE — Telephone Encounter (Signed)
Spoke with pt and she stated that the only medication she needs refilled right now is the Olmesartan. Medication has been refilled.

## 2022-01-17 NOTE — Telephone Encounter (Signed)
Patient not longer is using [facility] mail order pharmacy. She will be using Product/process development scientist on [employer] [employer]. All future refills go to [facility].

## 2022-01-17 NOTE — Addendum Note (Signed)
Addended by: Adair Laundry on: 01/17/2022 04:05 PM   Modules accepted: Orders

## 2022-01-20 ENCOUNTER — Other Ambulatory Visit: Payer: Self-pay | Admitting: Internal Medicine

## 2022-01-20 MED ORDER — OLMESARTAN MEDOXOMIL 40 MG PO TABS: 40.0000 mg | ORAL_TABLET | Freq: Every day | ORAL | 0 refills | Status: DC

## 2022-01-24 ENCOUNTER — Encounter: Payer: Self-pay | Admitting: Internal Medicine

## 2022-01-25 NOTE — Telephone Encounter (Signed)
Pt sent a mychart message saying the her bp readings a very elevated. One reading was 159/105 the other was 140/95. Pt is wanting to know if this can wait and be discussed at her appt on 02/09/22 or should we try to schedule her something sooner?

## 2022-01-27 ENCOUNTER — Ambulatory Visit (INDEPENDENT_AMBULATORY_CARE_PROVIDER_SITE_OTHER): Payer: Medicare HMO

## 2022-01-27 VITALS — BP 124/80 | HR 72

## 2022-01-27 DIAGNOSIS — I10 Essential (primary) hypertension: Secondary | ICD-10-CM

## 2022-01-27 NOTE — Progress Notes (Addendum)
Patient here for nurse visit BP check per order from [facility] 01/24/22.   Patient reports compliance with prescribed BP medications: yes, Olmesartan 40 mg once daily in the morning & Amlodipine 2.5 mg once daily in the evening, prescribed by [facility] nephro added in med list.   Last dose of BP medication: This morning '@7'$   BP Readings from Last 3 Encounters:  01/27/22 124/80  08/03/21 124/88  02/28/21 (!) 176/98   Pulse Readings from Last 3 Encounters:  01/27/22 72  08/03/21 (!) 55  02/28/21 60  I had pt sit & relax for about 10-15 mins due to automatic readings in L wrist being elevated as stated per below. Went back & retook BP's in L wrist:  Readings L wrist(auto): 1st L wrist:152/105    2nd L arm:145/101 PR:73                         PR:71  Pt also brought in BP readings for your review. Copy placed in review folder Notified pt that note w/readings would be sent to PCP for review & CMA would be reaching out to pt in regards to next steps or change in therapy.   Patient verbalized understanding of instructions.   [place] readings on a wrist cuff are not reliable.  Reading in office is at goal.  Continue current regimen.  Regards,   Deborra Medina, MD

## 2022-01-27 NOTE — Telephone Encounter (Signed)
Spoke with pt and scheduled her for a bp check today.

## 2022-01-31 ENCOUNTER — Encounter: Payer: Self-pay | Admitting: Internal Medicine

## 2022-01-31 DIAGNOSIS — R059 Cough, unspecified: Secondary | ICD-10-CM

## 2022-01-31 NOTE — Telephone Encounter (Signed)
No appts available

## 2022-02-01 ENCOUNTER — Ambulatory Visit: Payer: Medicare HMO

## 2022-02-01 MED ORDER — HYDROCOD POLI-CHLORPHE POLI ER 10-8 MG/5ML PO SUER: 5.0000 mL | Freq: Every evening | ORAL | 0 refills | Status: DC | PRN

## 2022-02-01 MED ORDER — PREDNISONE 10 MG PO TABS: ORAL_TABLET | ORAL | 0 refills | Status: DC

## 2022-02-08 ENCOUNTER — Encounter: Payer: Self-pay | Admitting: Internal Medicine

## 2022-02-09 ENCOUNTER — Encounter: Payer: Self-pay | Admitting: Internal Medicine

## 2022-02-09 ENCOUNTER — Ambulatory Visit (INDEPENDENT_AMBULATORY_CARE_PROVIDER_SITE_OTHER): Payer: Medicare HMO | Admitting: Internal Medicine

## 2022-02-09 VITALS — BP 128/76 | HR 67 | Temp 98.1°F | Wt 152.2 lb

## 2022-02-09 DIAGNOSIS — Z0000 Encounter for general adult medical examination without abnormal findings: Secondary | ICD-10-CM | POA: Diagnosis not present

## 2022-02-09 DIAGNOSIS — E7800 Pure hypercholesterolemia, unspecified: Secondary | ICD-10-CM

## 2022-02-09 DIAGNOSIS — Z86010 Personal history of colonic polyps: Secondary | ICD-10-CM | POA: Diagnosis not present

## 2022-02-09 DIAGNOSIS — Z1231 Encounter for screening mammogram for malignant neoplasm of breast: Secondary | ICD-10-CM | POA: Diagnosis not present

## 2022-02-09 DIAGNOSIS — I10 Essential (primary) hypertension: Secondary | ICD-10-CM

## 2022-02-09 DIAGNOSIS — N182 Chronic kidney disease, stage 2 (mild): Secondary | ICD-10-CM | POA: Diagnosis not present

## 2022-02-09 DIAGNOSIS — R69 Illness, unspecified: Secondary | ICD-10-CM | POA: Diagnosis not present

## 2022-02-09 DIAGNOSIS — R7301 Impaired fasting glucose: Secondary | ICD-10-CM | POA: Diagnosis not present

## 2022-02-09 DIAGNOSIS — R5383 Other fatigue: Secondary | ICD-10-CM

## 2022-02-09 DIAGNOSIS — F330 Major depressive disorder, recurrent, mild: Secondary | ICD-10-CM

## 2022-02-09 DIAGNOSIS — J069 Acute upper respiratory infection, unspecified: Secondary | ICD-10-CM | POA: Diagnosis not present

## 2022-02-09 LAB — CBC WITH DIFFERENTIAL/PLATELET
Basophils Absolute: 0 10*3/uL (ref 0.0–0.1)
Basophils Relative: 0.4 % (ref 0.0–3.0)
Eosinophils Absolute: 0.1 10*3/uL (ref 0.0–0.7)
Eosinophils Relative: 1.1 % (ref 0.0–5.0)
HCT: 39.4 % (ref 36.0–46.0)
Hemoglobin: 13.5 g/dL (ref 12.0–15.0)
Lymphocytes Relative: 35 % (ref 12.0–46.0)
Lymphs Abs: 3.7 10*3/uL (ref 0.7–4.0)
MCHC: 34.2 g/dL (ref 30.0–36.0)
MCV: 92.3 fl (ref 78.0–100.0)
Monocytes Absolute: 0.8 10*3/uL (ref 0.1–1.0)
Monocytes Relative: 7.4 % (ref 3.0–12.0)
Neutro Abs: 5.9 10*3/uL (ref 1.4–7.7)
Neutrophils Relative %: 56.1 % (ref 43.0–77.0)
Platelets: 403 10*3/uL — ABNORMAL HIGH (ref 150.0–400.0)
RBC: 4.27 Mil/uL (ref 3.87–5.11)
RDW: 13.7 % (ref 11.5–15.5)
WBC: 10.5 10*3/uL (ref 4.0–10.5)

## 2022-02-09 LAB — HEMOGLOBIN A1C: Hgb A1c MFr Bld: 6 % (ref 4.6–6.5)

## 2022-02-09 LAB — COMPREHENSIVE METABOLIC PANEL
ALT: 13 U/L (ref 0–35)
AST: 10 U/L (ref 0–37)
Albumin: 4.1 g/dL (ref 3.5–5.2)
Alkaline Phosphatase: 41 U/L (ref 39–117)
BUN: 16 mg/dL (ref 6–23)
CO2: 30 mEq/L (ref 19–32)
Calcium: 9 mg/dL (ref 8.4–10.5)
Chloride: 100 mEq/L (ref 96–112)
Creatinine, Ser: 0.97 mg/dL (ref 0.40–1.20)
GFR: 57.44 mL/min — ABNORMAL LOW (ref >60.00)
Glucose, Bld: 81 mg/dL (ref 70–99)
Potassium: 4.2 mEq/L (ref 3.5–5.1)
Sodium: 137 mEq/L (ref 135–145)
Total Bilirubin: 0.7 mg/dL (ref 0.2–1.2)
Total Protein: 6.2 g/dL (ref 6.0–8.3)

## 2022-02-09 LAB — LIPID PANEL
Cholesterol: 183 mg/dL (ref 0–200)
HDL: 59.7 mg/dL (ref >39.00)
LDL Cholesterol: 103 mg/dL — ABNORMAL HIGH (ref 0–99)
NonHDL: 123.05
Total CHOL/HDL Ratio: 3
Triglycerides: 99 mg/dL (ref 0.0–149.0)
VLDL: 19.8 mg/dL (ref 0.0–40.0)

## 2022-02-09 LAB — TSH: TSH: 1.92 u[IU]/mL (ref 0.35–5.50)

## 2022-02-09 LAB — LDL CHOLESTEROL, DIRECT: Direct LDL: 104 mg/dL

## 2022-02-09 LAB — MICROALBUMIN / CREATININE URINE RATIO
Creatinine,U: 151.3 mg/dL
Microalb Creat Ratio: 0.9 mg/g (ref 0.0–30.0)
Microalb, Ur: 1.4 mg/dL (ref 0.0–1.9)

## 2022-02-09 NOTE — Patient Instructions (Addendum)
Your recent respirator y infection may have been RSV.  I'm checking an antibody level today to see  if that was the case .    Switch to an OTC cough suppressant to make sure the cough resolves   Please let me know if your mood worsens over the holidays.  We can adjust your medications   You might want to try using Relaxium for insomnia  (as seen on TV commercials) . It is available through [employer] and contains all natural supplements:  Melatonin 5 mg  Chamomile 25 mg Passionflower extract 75 mg GABA 100 mg Ashwaganda extract 125 mg Magnesium citrate, glycinate, oxide (100 mg)  L tryptophan 500 mg Valerest (proprietary  ingredient ; probably valeria root extract)  Your annual mammogram has been ordered   [place] prefers that the patient schedule their own appt  so please  call to make your appointment [phone]      I strongly recommend that you get the TDaP  or the plain tetanus vaccine next month  at your pharmacy because you are due and it's 100% covered by medicare .     It is good for ten years for protection against tetanus,  And for lifetime protection against  diptheria and whooping cough

## 2022-02-09 NOTE — Progress Notes (Signed)
Patient ID: Grace [name], female    DOB: 17-Sep-1945  Age: 77 y.o. MRN: [id number]  The patient is here for annual preventive examination and management of other chronic and acute problems.   The risk factors are reflected in the social history.  The roster of all physicians providing medical care to patient - is listed in the Snapshot section of the chart.  Activities of daily living:  The patient is 100% independent in all ADLs: dressing, toileting, feeding as well as independent mobility  Home safety : The patient has smoke detectors in the home. They wear seatbelts.  There are no firearms at home. There is no violence in the home.   There is no risks for hepatitis, STDs or HIV. There is no   history of blood transfusion. They have no travel history to infectious disease endemic areas of the world.  The patient has seen their dentist in the last six month. They have seen their eye doctor in the last year. They admit to slight hearing difficulty with regard to whispered voices and some television programs.  They have deferred audiologic testing in the last year.  They do not  have excessive sun exposure. Discussed the need for sun protection: hats, long sleeves and use of sunscreen if there is significant sun exposure.   Diet: the importance of a healthy diet is discussed. They do have a healthy diet.  The benefits of regular aerobic exercise were discussed. She walks 4 times per week ,  20 minutes.   Depression screen: there are no signs or vegative symptoms of depression- irritability, change in appetite, anhedonia, sadness/tearfullness.  Cognitive assessment: the patient manages all their financial and personal affairs and is actively engaged. They could relate day,date,year and events; recalled 2/3 objects at 3 minutes; performed clock-face test normally.  The following portions of the patient's history were reviewed and updated as appropriate: allergies, current medications, past  family history, past medical history,  past surgical history, past social history  and problem list.  Visual acuity was not assessed per patient preference since she has regular follow up with her ophthalmologist. Hearing and body mass index were assessed and reviewed.   During the course of the visit the patient was educated and counseled about appropriate screening and preventive services including : fall prevention , diabetes screening, nutrition counseling, colorectal cancer screening, and recommended immunizations.    CC: The primary encounter diagnosis was Primary hypertension. Diagnoses of Pure hypercholesterolemia, Impaired fasting glucose, Other fatigue, Encounter for screening mammogram for malignant neoplasm of breast, Viral upper respiratory tract infection, Chronic kidney disease (CKD) stage G2/A2, mildly decreased glomerular filtration rate (GFR) between 60-89 mL/min/1.73 square meter and albuminuria creatinine ratio between 30-299 mg/g, Mild episode of recurrent major depressive disorder ([place]), Encounter for preventive health examination, and Hx of colonic polyps were also pertinent to this visit.   1) cough resolving s/p prednisone and cough suppressant.    2) anxiety worse since she retired in September due to having to deal with social security red tape. Also Worried about Tommy's nagging cough . Her natural response to stress is to pace.  Used to have panic attacks remotely but not now.    3) HTN: has purchased a new receiver and home readings have been normal.  Taking amlodipine and olmesartan without side effects. .  Home readings have been  130/80 or less    History [name] has a past medical history of allergic rhinitis, Basal cell carcinoma, BCC (basal cell  carcinoma of skin) (07/05/2021), Benign heart murmur, Depression, GERD (gastroesophageal reflux disease), colonic polyps, Hyperlipidemia, and Hypertension.   She has a past surgical history that includes Abdominal  hysterectomy and Total abdominal hysterectomy w/ bilateral salpingoophorectomy (1991).   Her family history includes Cancer (age of onset: 68) in her father; Heart disease in her father; Mental illness (age of onset: 53) in her mother.She reports that she has never smoked. She has never used smokeless tobacco. She reports that she does not drink alcohol and does not use drugs.  Outpatient Medications Prior to Visit  Medication Sig Dispense Refill   amLODipine (NORVASC) 2.5 MG tablet Take 2.5 mg by mouth every evening.     cetirizine (ZYRTEC) 5 MG tablet Take 5 mg by mouth daily.     esomeprazole (NEXIUM) 20 MG capsule Take 20 mg by mouth daily at 12 noon.     ezetimibe (ZETIA) 10 MG tablet TAKE 1 TABLET BY MOUTH  DAILY 90 tablet 3   famotidine (PEPCID) 20 MG tablet TAKE 1 TABLET BY MOUTH TWO  TIMES DAILY 180 tablet 1   FLUoxetine (PROZAC) 40 MG capsule TAKE 1 CAPSULE BY MOUTH DAILY 90 capsule 3   furosemide (LASIX) 20 MG tablet Take 1 tablet (20 mg total) by mouth daily. As needed for fluid retention 30 tablet 0   Multiple Vitamin (MULTIVITAMIN) tablet Take 1 tablet by mouth daily.     olmesartan (BENICAR) 40 MG tablet Take 1 tablet (40 mg total) by mouth daily. 90 tablet 0   chlorpheniramine-HYDROcodone (TUSSIONEX) 10-8 MG/5ML Take 5 mLs by mouth at bedtime as needed for cough. (Patient not taking: Reported on 02/09/2022) 115 mL 0   predniSONE (DELTASONE) 10 MG tablet 6 tablets daily for 3 days, then reduce by 1 tablet daily until gone (Patient not taking: Reported on 02/09/2022) 33 tablet 0   No facility-administered medications prior to visit.    Review of Systems  Objective:  BP 128/76   Pulse 67   Temp 98.1 F (36.7 C) (Oral)   Wt 152 lb 3.2 oz (69 kg)   SpO2 97%   BMI 25.33 kg/m   Physical Exam  Physical Exam   Assessment & Plan:   Problem List Items Addressed This Visit     Chronic kidney disease (CKD) stage G2/A2, mildly decreased glomerular filtration rate (GFR)  between 60-89 mL/min/1.73 square meter and albuminuria creatinine ratio between 30-299 mg/g    Continue olmesartan and annual follow up with [facility] .  She is avoiding NSAIDS  Lab Results  Component Value Date   CREATININE 0.97 02/09/2022    Lab Results  Component Value Date   LABMICR 20.4 08/02/2020   MICROALBUR 1.4 02/09/2022          Encounter for preventive health examination    age appropriate education and counseling updated, referrals for preventative services and immunizations addressed, dietary and smoking counseling addressed, most recent labs reviewed.  I have personally reviewed and have noted:   1) the patient's medical and social history 2) The pt's use of alcohol, tobacco, and illicit drugs 3) The patient's current medications and supplements 4) Functional ability including ADL's, fall risk, home safety risk, hearing and visual impairment 5) Diet and physical activities 6) Evidence for depression or mood disorder 7) The patient's height, weight, and BMI have been recorded in the chart  I have made referrals, and provided counseling and education based on review of the above       Hx of colonic  polyps    Repeat colonoscopy was normal in May 2022.  NO REPEAT advised due to patient age.          Hyperlipidemia   Relevant Orders   Lipid panel (Completed)   Direct LDL (Completed)   Hypertension - Primary    Well controlled on current regimen of amlodipine 2.5 mg,  olmesartan 40 mg daily . Renal function stable, no changes today.   Lab Results  Component Value Date   NA 137 02/09/2022   K 4.2 02/09/2022   CL 100 02/09/2022   CO2 30 02/09/2022   Lab Results  Component Value Date   CREATININE 0.97 02/09/2022        Relevant Orders   Comp Met (CMET) (Completed)   Urine Microalbumin w/creat. ratio (Completed)   Major depressive disorder, recurrent episode ([place])    Herr mood disorder has been aggravated  by recent retirement with adjustment issues.   She defers medication adjustment at this time       Other Visit Diagnoses     Impaired fasting glucose       Relevant Orders   HgB A1c (Completed)   Other fatigue       Relevant Orders   TSH (Completed)   CBC with Differential/Platelet (Completed)   Encounter for screening mammogram for malignant neoplasm of breast       Relevant Orders   MM 3D SCREEN BREAST BILATERAL   Viral upper respiratory tract infection       Relevant Orders   RSV(respiratory syncytial virus) ab, bld       I have discontinued Raegen Tarpley. Hagans "Chris"'s predniSONE and chlorpheniramine-HYDROcodone. I am also having her maintain her cetirizine, multivitamin, esomeprazole, famotidine, furosemide, FLUoxetine, ezetimibe, olmesartan, and amLODipine.    I provided 40 minutes of  face-to-face time during this encounter reviewing patient's current problems and past surgeries,  recent labs and imaging studies, providing counseling on the above mentioned problems , and coordination  of care .   Follow-up: Return in about 6 months (around 08/10/2022).   Crecencio Mc, MD

## 2022-02-09 NOTE — Assessment & Plan Note (Addendum)
Herr mood disorder has been aggravated  by recent retirement with adjustment issues.  She defers medication adjustment at this time

## 2022-02-09 NOTE — Assessment & Plan Note (Addendum)
Continue olmesartan and annual follow up with [facility] .  She is avoiding NSAIDS  Lab Results  Component Value Date   CREATININE 0.97 02/09/2022     Lab Results  Component Value Date   LABMICR 20.4 08/02/2020   MICROALBUR 1.4 02/09/2022

## 2022-02-11 NOTE — Assessment & Plan Note (Addendum)
Repeat colonoscopy was normal in May 2022.  NO REPEAT advised due to patient age.

## 2022-02-11 NOTE — Assessment & Plan Note (Signed)
Well controlled on current regimen of amlodipine 2.5 mg,  olmesartan 40 mg daily . Renal function stable, no changes today.   Lab Results  Component Value Date   NA 137 02/09/2022   K 4.2 02/09/2022   CL 100 02/09/2022   CO2 30 02/09/2022   Lab Results  Component Value Date   CREATININE 0.97 02/09/2022

## 2022-02-11 NOTE — Assessment & Plan Note (Signed)

## 2022-02-13 ENCOUNTER — Encounter: Payer: Self-pay | Admitting: Internal Medicine

## 2022-02-17 ENCOUNTER — Encounter: Payer: Self-pay | Admitting: Internal Medicine

## 2022-02-17 LAB — RSV(RESPIRATORY SYNCYTIAL VIRUS) AB, BLOOD: RSV Antibodies: <1:8 {titer}

## 2022-03-09 ENCOUNTER — Other Ambulatory Visit: Payer: Self-pay

## 2022-03-09 ENCOUNTER — Telehealth: Payer: Self-pay | Admitting: Internal Medicine

## 2022-03-09 MED ORDER — EZETIMIBE 10 MG PO TABS: 10.0000 mg | ORAL_TABLET | Freq: Every day | ORAL | 3 refills | Status: DC

## 2022-03-09 MED ORDER — FLUOXETINE HCL 40 MG PO CAPS: 40.0000 mg | ORAL_CAPSULE | Freq: Every day | ORAL | 3 refills | Status: DC

## 2022-03-09 NOTE — Telephone Encounter (Signed)
Pt need a refill on FLUoxetine and ezetimibe sent to [facility] [address]

## 2022-03-09 NOTE — Telephone Encounter (Signed)
sent 

## 2022-03-21 DIAGNOSIS — H524 Presbyopia: Secondary | ICD-10-CM | POA: Diagnosis not present

## 2022-03-21 DIAGNOSIS — Z961 Presence of intraocular lens: Secondary | ICD-10-CM | POA: Diagnosis not present

## 2022-03-27 ENCOUNTER — Encounter: Payer: Self-pay | Admitting: Dermatology

## 2022-03-27 ENCOUNTER — Ambulatory Visit: Payer: Medicare HMO | Admitting: Dermatology

## 2022-03-27 VITALS — BP 138/85 | HR 69

## 2022-03-27 DIAGNOSIS — L821 Other seborrheic keratosis: Secondary | ICD-10-CM

## 2022-03-27 DIAGNOSIS — L570 Actinic keratosis: Secondary | ICD-10-CM

## 2022-03-27 DIAGNOSIS — D692 Other nonthrombocytopenic purpura: Secondary | ICD-10-CM | POA: Diagnosis not present

## 2022-03-27 DIAGNOSIS — Z85828 Personal history of other malignant neoplasm of skin: Secondary | ICD-10-CM

## 2022-03-27 DIAGNOSIS — D1801 Hemangioma of skin and subcutaneous tissue: Secondary | ICD-10-CM | POA: Diagnosis not present

## 2022-03-27 DIAGNOSIS — L918 Other hypertrophic disorders of the skin: Secondary | ICD-10-CM | POA: Diagnosis not present

## 2022-03-27 DIAGNOSIS — L578 Other skin changes due to chronic exposure to nonionizing radiation: Secondary | ICD-10-CM

## 2022-03-27 NOTE — Progress Notes (Signed)
Follow-Up Visit   Subjective  Grace [name] is a 77 y.o. female who presents for the following: Hx of BCC L forearm (L forearm, 71mf/u).   The following portions of the chart were reviewed this encounter and updated as appropriate:       Review of Systems:  No other skin or systemic complaints except as noted in HPI or Assessment and Plan.  Objective  Well appearing patient in no apparent distress; mood and affect are within normal limits.  A focused examination was performed including bil arms, face, back. Relevant physical exam findings are noted in the Assessment and Plan.  Left Forearm Well healed scar with no evidence of recurrence.   L zygoma x 2 (2) Pink scaly macules    Assessment & Plan   Seborrheic Keratoses - Stuck-on, waxy, tan-brown papules and/or plaques  - Benign-appearing - Discussed benign etiology and prognosis. - Observe - Call for any changes - arms, face  Hemangiomas - Red papules - Discussed benign nature - Observe - Call for any changes  - face  Purpura - Chronic; persistent and recurrent.  Treatable, but not curable. - Violaceous macules and patches - Benign - Related to trauma, age, sun damage and/or use of blood thinners, chronic use of topical and/or oral steroids - Observe - Can use OTC arnica containing moisturizer such as Dermend Bruise Formula if desired - Call for worsening or other concerns - arms  Actinic Damage - chronic, secondary to cumulative UV radiation exposure/sun exposure over time - diffuse scaly erythematous macules with underlying dyspigmentation - Recommend daily broad spectrum sunscreen SPF 30+ to sun-exposed areas, reapply every 2 hours as needed.  - Recommend staying in the shade or wearing long sleeves, sun glasses (UVA+UVB protection) and wide brim hats (4-inch brim around the entire circumference of the hat). - Call for new or changing lesions.   Acrochordons (Skin Tags) - Fleshy, skin-colored  pedunculated papules - Benign appearing.  - Observe. - If desired, they can be removed with an in office procedure that is not covered by insurance. - Please call the clinic if you notice any new or changing lesions.   Melanocytic Nevi - Tan-brown and/or pink-flesh-colored symmetric macules and papules - Benign appearing on exam today - Observation - Call clinic for new or changing moles - Recommend daily use of broad spectrum spf 30+ sunscreen to sun-exposed areas.   - face  History of basal cell carcinoma (BCC) Left Forearm  Clear. Observe for recurrence. Call clinic for new or changing lesions.  Recommend regular skin exams, daily broad-spectrum spf 30+ sunscreen use, and photoprotection.    AK (actinic keratosis) (2) L zygoma x 2  Destruction of lesion - L zygoma x 2  Destruction method: cryotherapy   Informed consent: discussed and consent obtained   Lesion destroyed using liquid nitrogen: Yes   Region frozen until ice ball extended beyond lesion: Yes   Outcome: patient tolerated procedure well with no complications   Post-procedure details: wound care instructions given   Additional details:  Prior to procedure, discussed risks of blister formation, small wound, skin dyspigmentation, or rare scar following cryotherapy. Recommend Vaseline ointment to treated areas while healing.    Return in about 6 months (around 09/25/2022) for TBSE, Hx of BCC, Hx of AKs.  I, SOthelia Pulling RMA, am acting as scribe for TBrendolyn Patty MD .  Documentation: I have reviewed the above documentation for accuracy and completeness, and I agree with the above.  TBaxter Flattery  Nicole Kindred MD

## 2022-03-27 NOTE — Patient Instructions (Addendum)
Seborrheic Keratosis  What causes seborrheic keratoses? Seborrheic keratoses are harmless, common skin growths that first appear during adult life.  As time goes by, more growths appear.  Some people may develop a large number of them.  Seborrheic keratoses appear on both covered and uncovered body parts.  They are not caused by sunlight.  The tendency to develop seborrheic keratoses can be inherited.  They vary in color from skin-colored to gray, brown, or even black.  They can be either smooth or have a rough, warty surface.   Seborrheic keratoses are superficial and look as if they were stuck on the skin.  Under the microscope this type of keratosis looks like layers upon layers of skin.  That is why at times the top layer may seem to fall off, but the rest of the growth remains and re-grows.    Treatment Seborrheic keratoses do not need to be treated, but can easily be removed in the office.  Seborrheic keratoses often cause symptoms when they rub on clothing or jewelry.  Lesions can be in the way of shaving.  If they become inflamed, they can cause itching, soreness, or burning.  Removal of a seborrheic keratosis can be accomplished by freezing, burning, or surgery. If any spot bleeds, scabs, or grows rapidly, please return to have it checked, as these can be an indication of a skin cancer.  Cryotherapy Aftercare  Wash gently with soap and water everyday.   Apply Vaseline and Band-Aid daily until healed.    Due to recent changes in healthcare laws, you may see results of your pathology and/or laboratory studies on MyChart before the doctors have had a chance to review them. We understand that in some cases there may be results that are confusing or concerning to you. Please understand that not all results are received at the same time and often the doctors may need to interpret multiple results in order to provide you with the best plan of care or course of treatment. Therefore, we ask that you  please give us 2 business days to thoroughly review all your results before contacting the office for clarification. Should we see a critical lab result, you will be contacted sooner.   If You Need Anything After Your Visit  If you have any questions or concerns for your doctor, please call our main line at [phone] and press option 4 to reach your doctor's medical assistant. If no one answers, please leave a voicemail as directed and we will return your call as soon as possible. Messages left after 4 pm will be answered the following business day.   You may also send us a message via MyChart. We typically respond to MyChart messages within 1-2 business days.  For prescription refills, please ask your pharmacy to contact our office. Our fax number is [phone].  If you have an urgent issue when the clinic is closed that cannot wait until the next business day, you can page your doctor at the number below.    Please note that while we do our best to be available for urgent issues outside of office hours, we are not available 24/7.   If you have an urgent issue and are unable to reach us, you may choose to seek medical care at your doctor's office, retail clinic, urgent care center, or emergency room.  If you have a medical emergency, please immediately call 911 or go to the emergency department.  Pager Numbers  - Dr. Kowalski: [phone]  -   Dr. Moye: [phone]  - Dr. Stewart: [phone]  In the event of inclement weather, please call our main line at [phone] for an update on the status of any delays or closures.  Dermatology Medication Tips: Please keep the boxes that topical medications come in in order to help keep track of the instructions about where and how to use these. Pharmacies typically print the medication instructions only on the boxes and not directly on the medication tubes.   If your medication is too expensive, please contact our office at  [phone] option 4 or send us a message through MyChart.   We are unable to tell what your co-pay for medications will be in advance as this is different depending on your insurance coverage. However, we may be able to find a substitute medication at lower cost or fill out paperwork to get insurance to cover a needed medication.   If a prior authorization is required to get your medication covered by your insurance company, please allow us 1-2 business days to complete this process.  Drug prices often vary depending on where the prescription is filled and some pharmacies may offer cheaper prices.  The website www.goodrx.com contains coupons for medications through different pharmacies. The prices here do not account for what the cost may be with help from insurance (it may be cheaper with your insurance), but the website can give you the price if you did not use any insurance.  - You can print the associated coupon and take it with your prescription to the pharmacy.  - You may also stop by our office during regular business hours and pick up a GoodRx coupon card.  - If you need your prescription sent electronically to a different pharmacy, notify our office through [facility] MyChart or by phone at [phone] option 4.     Si Usted Necesita Algo Despus de Su Visita  Tambin puede enviarnos un mensaje a travs de MyChart. Por lo general respondemos a los mensajes de MyChart en el transcurso de 1 a 2 das hbiles.  Para renovar recetas, por favor pida a su farmacia que se ponga en contacto con nuestra oficina. Nuestro nmero de fax es el [phone].  Si tiene un asunto urgente cuando la clnica est cerrada y que no puede esperar hasta el siguiente da hbil, puede llamar/localizar a su doctor(a) al nmero que aparece a continuacin.   Por favor, tenga en cuenta que aunque hacemos todo lo posible para estar disponibles para asuntos urgentes fuera del horario de oficina, no estamos  disponibles las 24 horas del da, los 7 das de la semana.   Si tiene un problema urgente y no puede comunicarse con nosotros, puede optar por buscar atencin mdica  en el consultorio de su doctor(a), en una clnica privada, en un centro de atencin urgente o en una sala de emergencias.  Si tiene una emergencia mdica, por favor llame inmediatamente al 911 o vaya a la sala de emergencias.  Nmeros de bper  - Dr. Kowalski: [phone]  - Dra. Moye: [phone]  - Dra. Stewart: [phone]  En caso de inclemencias del tiempo, por favor llame a nuestra lnea principal al [phone] para una actualizacin sobre el estado de cualquier retraso o cierre.  Consejos para la medicacin en dermatologa: Por favor, guarde las cajas en las que vienen los medicamentos de uso tpico para ayudarle a seguir las instrucciones sobre dnde y cmo usarlos. Las farmacias generalmente imprimen las instrucciones del medicamento slo en las cajas y   no directamente en los tubos del medicamento.   Si su medicamento es muy caro, por favor, pngase en contacto con nuestra oficina llamando al [phone] y presione la opcin 4 o envenos un mensaje a travs de MyChart.   No podemos decirle cul ser su copago por los medicamentos por adelantado ya que esto es diferente dependiendo de la cobertura de su seguro. Sin embargo, es posible que podamos encontrar un medicamento sustituto a menor costo o llenar un formulario para que el seguro cubra el medicamento que se considera necesario.   Si se requiere una autorizacin previa para que su compaa de seguros cubra su medicamento, por favor permtanos de 1 a 2 das hbiles para completar este proceso.  Los precios de los medicamentos varan con frecuencia dependiendo del lugar de dnde se surte la receta y alguna farmacias pueden ofrecer precios ms baratos.  El sitio web www.goodrx.com tiene cupones para medicamentos de diferentes farmacias. Los precios aqu no  tienen en cuenta lo que podra costar con la ayuda del seguro (puede ser ms barato con su seguro), pero el sitio web puede darle el precio si no utiliz ningn seguro.  - Puede imprimir el cupn correspondiente y llevarlo con su receta a la farmacia.  - Tambin puede pasar por nuestra oficina durante el horario de atencin regular y recoger una tarjeta de cupones de GoodRx.  - Si necesita que su receta se enve electrnicamente a una farmacia diferente, informe a nuestra oficina a travs de MyChart de [place] o por telfono llamando al [phone] y presione la opcin 4.  

## 2022-04-05 DIAGNOSIS — Z1231 Encounter for screening mammogram for malignant neoplasm of breast: Secondary | ICD-10-CM | POA: Diagnosis not present

## 2022-04-05 LAB — HM MAMMOGRAPHY

## 2022-05-26 ENCOUNTER — Encounter: Payer: Self-pay | Admitting: Internal Medicine

## 2022-05-31 ENCOUNTER — Encounter: Payer: Self-pay | Admitting: Internal Medicine

## 2022-05-31 ENCOUNTER — Other Ambulatory Visit: Payer: Self-pay | Admitting: Internal Medicine

## 2022-05-31 DIAGNOSIS — F411 Generalized anxiety disorder: Secondary | ICD-10-CM | POA: Insufficient documentation

## 2022-05-31 MED ORDER — SERTRALINE HCL 50 MG PO TABS: 50.0000 mg | ORAL_TABLET | Freq: Every day | ORAL | 3 refills | Status: DC

## 2022-05-31 NOTE — Telephone Encounter (Signed)
Pt called in regarding previous message. Pt would like to know if provider can send some anxiety meds over to [facility] [address].??

## 2022-06-15 ENCOUNTER — Encounter: Payer: Self-pay | Admitting: Internal Medicine

## 2022-06-21 ENCOUNTER — Telehealth: Payer: Self-pay | Admitting: Internal Medicine

## 2022-06-21 NOTE — Telephone Encounter (Signed)
Contacted Grace [name] to schedule their annual wellness visit. Welcome to Medicare visit Due by 12/12/2022.  Thank you,  [facility] Direct dial  [phone]

## 2022-07-10 ENCOUNTER — Other Ambulatory Visit: Payer: Self-pay | Admitting: Internal Medicine

## 2022-08-11 ENCOUNTER — Ambulatory Visit: Payer: Medicare HMO | Admitting: Internal Medicine

## 2022-08-15 ENCOUNTER — Encounter: Payer: Self-pay | Admitting: Internal Medicine

## 2022-09-18 ENCOUNTER — Encounter: Payer: Self-pay | Admitting: Internal Medicine

## 2022-09-18 ENCOUNTER — Ambulatory Visit (INDEPENDENT_AMBULATORY_CARE_PROVIDER_SITE_OTHER): Payer: Medicare HMO | Admitting: Internal Medicine

## 2022-09-18 VITALS — BP 128/78 | HR 60 | Temp 97.5°F | Ht 65.0 in | Wt 155.8 lb

## 2022-09-18 DIAGNOSIS — M79672 Pain in left foot: Secondary | ICD-10-CM | POA: Insufficient documentation

## 2022-09-18 DIAGNOSIS — E7800 Pure hypercholesterolemia, unspecified: Secondary | ICD-10-CM

## 2022-09-18 DIAGNOSIS — F411 Generalized anxiety disorder: Secondary | ICD-10-CM

## 2022-09-18 DIAGNOSIS — I10 Essential (primary) hypertension: Secondary | ICD-10-CM

## 2022-09-18 LAB — LIPID PANEL
Cholesterol: 217 mg/dL — ABNORMAL HIGH (ref 0–200)
HDL: 59.1 mg/dL (ref >39.00)
LDL Cholesterol: 136 mg/dL — ABNORMAL HIGH (ref 0–99)
NonHDL: 158.38
Total CHOL/HDL Ratio: 4
Triglycerides: 113 mg/dL (ref 0.0–149.0)
VLDL: 22.6 mg/dL (ref 0.0–40.0)

## 2022-09-18 LAB — COMPREHENSIVE METABOLIC PANEL
ALT: 14 U/L (ref 0–35)
AST: 18 U/L (ref 0–37)
Albumin: 4.5 g/dL (ref 3.5–5.2)
Alkaline Phosphatase: 48 U/L (ref 39–117)
BUN: 13 mg/dL (ref 6–23)
CO2: 29 mEq/L (ref 19–32)
Calcium: 9.5 mg/dL (ref 8.4–10.5)
Chloride: 103 mEq/L (ref 96–112)
Creatinine, Ser: 0.96 mg/dL (ref 0.40–1.20)
GFR: 57.91 mL/min — ABNORMAL LOW (ref >60.00)
Glucose, Bld: 94 mg/dL (ref 70–99)
Potassium: 4.1 mEq/L (ref 3.5–5.1)
Sodium: 142 mEq/L (ref 135–145)
Total Bilirubin: 0.7 mg/dL (ref 0.2–1.2)
Total Protein: 6.6 g/dL (ref 6.0–8.3)

## 2022-09-18 LAB — LDL CHOLESTEROL, DIRECT: Direct LDL: 127 mg/dL

## 2022-09-18 MED ORDER — PAROXETINE HCL 20 MG PO TABS
20.0000 mg | ORAL_TABLET | Freq: Every day | ORAL | 1 refills | Status: DC
Start: 2022-09-18 — End: 2023-04-25

## 2022-09-18 MED ORDER — OLMESARTAN MEDOXOMIL 40 MG PO TABS: 40.0000 mg | ORAL_TABLET | Freq: Every day | ORAL | 3 refills | Status: DC

## 2022-09-18 MED ORDER — BUSPIRONE HCL 10 MG PO TABS: 10.0000 mg | ORAL_TABLET | Freq: Three times a day (TID) | ORAL | 0 refills | Status: DC

## 2022-09-18 NOTE — Patient Instructions (Addendum)
[email or web address] is the website that Grace [name] has created to make people aware of  world  events that are fulfulling Biblical prophecy   The "jump  to " button can take you to the Devotional s page ;  under the "inspired by  God" button   [employer] in [place] : check [place] out on Youtube    "Be anxious for nothing,  But in everything , by prayer and supplication , let your request by made known to God. And the peace of God which surpasses all understanding, shall guard your hearts and minds through [employer]."  Phillipians 4:6   Changing zoloft to paxil .  Adding buspirone after one week  of paxil,  use up to 3 times daily as needed   Referral to Dr Al Corpus for your foot issue [place] for the toes aA

## 2022-09-18 NOTE — Assessment & Plan Note (Addendum)
Changing from zoloft to paxil.  Adding buspirone for prn management of panic symptoms.  Faith based Counselling given

## 2022-09-18 NOTE — Assessment & Plan Note (Addendum)
She has developed a pressure ulcer on the medial side of her 2nd toe .  Recommending placing mole skin between toes and choose shoes with widened toe box .  Referring to [facility]

## 2022-09-18 NOTE — Progress Notes (Signed)
Subjective:  Patient ID: Grace [name], female    DOB: 09-13-1945  Age: 77 y.o. MRN: [id number]  CC: The primary encounter diagnosis was Primary hypertension. Diagnoses of Pure hypercholesterolemia, Foot pain, left, and Anxiety, generalized were also pertinent to this visit.   HPI Grace [name] presents for  Chief Complaint  Patient presents with   Medical Management of Chronic Issues   Anxiety/depression:  Thayer Ohm reports a strong FH of anxeity (father, grandfather) and personal history of anxiety since childhood with panic attacks managed remotely with clonazepam and alprazolam.  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  The anxiety is present upon waking,  constantly feeling like something bad is going to happen .  Trouble sleeping at night .  Does not want to resume clonazepam or alprazolam taking sertraline 50 mg .  She has felt somewhat estragned from the church of her  youth due to the church's straying from upholding Biblical principles in favor of gaining popularity with the public.   Right Foot problem  2nd toe is rubbing against the lateral surface of the great tie DIP and has developed a stage 1 pressure ulcer.  Callouses  have become overgrown  wants to see [facility]   Hypertension: patient checks blood pressure twice weekly at home.  Readings have been for the most part <130/80 at rest . Patient is following a reduced salt diet most days and is taking  amlodipine medications as prescribed   Outpatient Medications Prior to Visit  Medication Sig Dispense Refill   amLODipine (NORVASC) 2.5 MG tablet Take 2.5 mg by mouth every evening.     cetirizine (ZYRTEC) 5 MG tablet Take 5 mg by mouth daily.     esomeprazole (NEXIUM) 20 MG capsule Take 20 mg by mouth daily at 12 noon.     ezetimibe (ZETIA) 10 MG tablet Take 1 tablet (10 mg total) by mouth daily. 90 tablet 3   famotidine  (PEPCID) 20 MG tablet TAKE 1 TABLET BY MOUTH TWO  TIMES DAILY 180 tablet 1   furosemide (LASIX) 20 MG tablet Take 1 tablet (20 mg total) by mouth daily. As needed for fluid retention 30 tablet 0   Multiple Vitamin (MULTIVITAMIN) tablet Take 1 tablet by mouth daily.     sertraline (ZOLOFT) 50 MG tablet Take 1 tablet (50 mg total) by mouth daily. 30 tablet 3   olmesartan (BENICAR) 40 MG tablet Take 1 tablet by mouth once daily 90 tablet 0   No facility-administered medications prior to visit.    Review of Systems;  Patient denies headache, fevers, malaise, unintentional weight loss, skin rash, eye pain, sinus congestion and sinus pain, sore throat, dysphagia,  hemoptysis , cough, dyspnea, wheezing, chest pain, palpitations, orthopnea, edema, abdominal pain, nausea, melena, diarrhea, constipation, flank pain, dysuria, hematuria, urinary  Frequency, nocturia, numbness, tingling, seizures,  Focal weakness, Loss of consciousness,  Tremor, and suicidal ideation.      Objective:  BP 128/78   Pulse 60   Temp (!) 97.5 F (36.4 C) (Oral)   Ht 5\' 5"  (1.651 m)   Wt 155 lb 12.8 oz (70.7 kg)   SpO2 98%   BMI 25.93 kg/m   BP Readings from Last 3 Encounters:  09/18/22 128/78  03/27/22 138/85  02/09/22 128/76    Wt Readings from Last 3 Encounters:  09/18/22 155 lb 12.8 oz (70.7 kg)  02/09/22 152 lb  3.2 oz (69 kg)  08/03/21 148 lb 6.4 oz (67.3 kg)    Physical Exam Vitals reviewed.  Constitutional:      General: She is not in acute distress.    Appearance: Normal appearance. She is normal weight. She is not ill-appearing, toxic-appearing or diaphoretic.  HENT:     Head: Normocephalic.  Eyes:     General: No scleral icterus.       Right eye: No discharge.        Left eye: No discharge.     Conjunctiva/sclera: Conjunctivae normal.  Cardiovascular:     Rate and Rhythm: Normal rate and regular rhythm.     Heart sounds: Normal heart sounds.  Pulmonary:     Effort: Pulmonary effort is  normal. No respiratory distress.     Breath sounds: Normal breath sounds.  Musculoskeletal:        General: Normal range of motion.  Skin:    General: Skin is warm and dry.  Neurological:     General: No focal deficit present.     Mental Status: She is alert and oriented to person, place, and time. Mental status is at baseline.  Psychiatric:        Attention and Perception: Attention normal.        Mood and Affect: Mood is anxious. Affect is tearful.        Speech: She is noncommunicative.        Behavior: Behavior normal.        Thought Content: Thought content normal.        Judgment: Judgment normal.   Lab Results  Component Value Date   HGBA1C 6.0 02/09/2022    Lab Results  Component Value Date   CREATININE 0.96 09/18/2022   CREATININE 0.97 02/09/2022   CREATININE 0.95 08/03/2021    Lab Results  Component Value Date   WBC 10.5 02/09/2022   HGB 13.5 02/09/2022   HCT 39.4 02/09/2022   PLT 403.0 (H) 02/09/2022   GLUCOSE 94 09/18/2022   CHOL 217 (H) 09/18/2022   TRIG 113.0 09/18/2022   HDL 59.10 09/18/2022   LDLDIRECT 127.0 09/18/2022   LDLCALC 136 (H) 09/18/2022   ALT 14 09/18/2022   AST 18 09/18/2022   NA 142 09/18/2022   K 4.1 09/18/2022   CL 103 09/18/2022   CREATININE 0.96 09/18/2022   BUN 13 09/18/2022   CO2 29 09/18/2022   TSH 1.92 02/09/2022   HGBA1C 6.0 02/09/2022   MICROALBUR 1.4 02/09/2022    DG Bone Density  Result Date: 10/21/2020 EXAM: DUAL X-RAY ABSORPTIOMETRY (DXA) FOR BONE MINERAL DENSITY IMPRESSION: Dear Dr. Darrick Huntsman, Your patient KINDLE CONEY completed a FRAX assessment on 10/21/2020 using the Lunar iDXA DXA System (analysis version: 14.10) manufactured by [employer]. The following summarizes the results of our evaluation. PATIENT BIOGRAPHICAL: Name: Grace [name] Patient ID: [id number] Birth Date: [birth year] Height:    65.0 in. Gender:     Female    [age]       Weight:    165.8 lbs. Ethnicity:  White                             Exam Date: 10/21/2020 FRAX* RESULTS:  (version: 3.5) 10-year Probability of Fracture1 Major Osteoporotic Fracture2 Hip Fracture 15.4% 2.2% Population: [place] (Caucasian) Risk Factors: History of Fracture (Adult) Based on Femur (Right) Neck BMD 1 -The 10-year probability of  fracture may be lower than reported if the patient has received treatment. 2 -Major Osteoporotic Fracture: Clinical Spine, Forearm, Hip or Shoulder *FRAX is a Armed forces logistics/support/administrative officer of the [employer] of [employer] for Metabolic Bone Disease, a World Science writer (WHO) [employer]. ASSESSMENT: The probability of a major osteoporotic fracture is 15.4% within the next ten years. The probability of a hip fracture is 2.2% within the next ten years. . Your patient Malary Mabin completed a BMD test on 10/21/2020 using the [employer] iDXA DXA System (software version: 14.10) manufactured by [employer]. The following summarizes the results of our evaluation. Technologist: [facility] PATIENT BIOGRAPHICAL: Name: Chayanne, Kreft Patient ID: [id number] Birth Date: November 10, 1945 Height: 65.0 in. Gender: Female Exam Date: 10/21/2020 Weight: 165.8 lbs. Indications: Advanced Age, Caucasian, History of Fracture (Adult), Hysterectomy, Oophorectomy Bilateral, Postmenopausal Fractures: Right forearm Treatments: Calcium, Vitamin D DENSITOMETRY RESULTS: Site         Region      Measured Date Measured Age WHO Classification Young Adult T-score BMD         %Change vs. Previous Significant Change (*) DualFemur Total Right 10/21/2020 73.5 Osteopenia -1.3 0.848 g/cm2 -23.7% Yes DualFemur Total Right 12/17/2007 60.6 Normal 0.8 1.111 g/cm2 - - DualFemur Total Mean 10/21/2020 73.5 Normal -0.8 0.911 g/cm2 -21.2% Yes DualFemur Total Mean 12/17/2007 60.6 Normal 1.2 1.156 g/cm2 - - Left Forearm Radius 33% 10/21/2020 73.5 Normal -0.7 0.816 g/cm2 -7.9% Yes Left Forearm Radius 33% 12/17/2007 60.6 Normal 0.1 0.886 g/cm2 - - ASSESSMENT: The BMD measured at  Femur Total Right is 0.848 g/cm2 with a T-score of -1.3. This patient is considered osteopenic according to World Health Organization [facility]) criteria. The scan quality is good. Lumbar spine was not utilized due to advanced degenerative changes. Compared with prior study, there has been significant decrease in the total hip. World Science writer [facility]) criteria for post-menopausal, Caucasian Women: Normal:                   T-score at or above -1 SD Osteopenia/low bone mass: T-score between -1 and -2.5 SD Osteoporosis:             T-score at or below -2.5 SD RECOMMENDATIONS: 1. All patients should optimize calcium and vitamin D intake. 2. Consider FDA-approved medical therapies in postmenopausal women and men aged 44 years and older, based on the following: a. A hip or vertebral(clinical or morphometric) fracture b. T-score < -2.5 at the femoral neck or spine after appropriate evaluation to exclude secondary causes c. Low bone mass (T-score between -1.0 and -2.5 at the femoral neck or spine) and a 10-year probability of a hip fracture > 3% or a 10-year probability of a major osteoporosis-related fracture > 20% based on the [place]-adapted WHO algorithm 3. Clinician judgment and/or patient preferences may indicate treatment for people with 10-year fracture probabilities above or below these levels FOLLOW-UP: People with diagnosed cases of osteoporosis or at high risk for fracture should have regular bone mineral density tests. For patients eligible for Medicare, routine testing is allowed once every 2 years. The testing frequency can be increased to one year for patients who have rapidly progressing disease, those who are receiving or discontinuing medical therapy to restore bone mass, or have additional risk factors. I have reviewed this report, and agree with the above findings. Mark A. Tyron Russell, M.D. [facility] Electronically Signed   By: Ulyses Southward M.D.   On: 10/21/2020 15:27    Assessment & Plan:   .  Primary hypertension -     Comprehensive metabolic panel  Pure hypercholesterolemia -     Lipid panel -     LDL cholesterol, direct  Foot pain, left -     Ambulatory referral to Podiatry  Anxiety, generalized Assessment & Plan: Changing from zoloft to paxil.  Adding buspirone for prn management of panic symptoms.  Faith based Counselling given    Other orders -     Olmesartan Medoxomil; Take 1 tablet (40 mg total) by mouth daily.  Dispense: 90 tablet; Refill: 3 -     PARoxetine HCl; Take 1 tablet (20 mg total) by mouth daily.  Dispense: 90 tablet; Refill: 1 -     busPIRone HCl; Take 1 tablet (10 mg total) by mouth 3 (three) times daily.  Dispense: 90 tablet; Refill: 0     I provided 40 minutes of face-to-face time during this encounter reviewing patient's last visit with me,  recent surgical and non surgical procedures, previous  labs and imaging studies, counseling on currently addressed issues,  and post visit ordering to diagnostics and therapeutics .   Follow-up: Return in about 3 months (around 12/19/2022) for DEPRESSION/ANXIETY.   Sherlene Shams, MD

## 2022-09-21 ENCOUNTER — Ambulatory Visit: Payer: Medicare HMO | Admitting: Podiatry

## 2022-09-21 DIAGNOSIS — L853 Xerosis cutis: Secondary | ICD-10-CM

## 2022-09-21 MED ORDER — AMMONIUM LACTATE 12 % EX LOTN: 1.0000 | TOPICAL_LOTION | CUTANEOUS | 0 refills | Status: DC | PRN

## 2022-09-21 NOTE — Progress Notes (Signed)
Subjective:  Patient ID: Grace [name], female    DOB: Sep 14, 1945,  MRN: [id number]  Chief Complaint  Patient presents with   Callouses    Patient came in today for bilateral callus trim     77 y.o. female presents with the above complaint.  Patient presents with severe dry skin to bilateral forefoot.  She went to get it evaluated she has not tried anything for it.  She has not been able to moisturize well.  She has history of multiple surgeries in the foot.  No pain except for dry skin.  She denies putting exam lotion on it.   Review of Systems: Negative except as noted in the HPI. Denies N/V/F/Ch.  Past Medical History:  Diagnosis Date   Actinic keratosis    allergic rhinitis    Basal cell carcinoma    Areas of face   BCC (basal cell carcinoma of skin) 07/05/2021   L forearm, [facility]   Benign heart murmur    Depression    resolved   GERD (gastroesophageal reflux disease)    Hx of colonic polyps    Hyperlipidemia    Hypertension     Current Outpatient Medications:    ammonium lactate (AMLACTIN DAILY) 12 % lotion, Apply 1 Application topically as needed for dry skin., Disp: 400 g, Rfl: 0   amLODipine (NORVASC) 2.5 MG tablet, Take 2.5 mg by mouth every evening., Disp: , Rfl:    busPIRone (BUSPAR) 10 MG tablet, Take 1 tablet (10 mg total) by mouth 3 (three) times daily., Disp: 90 tablet, Rfl: 0   cetirizine (ZYRTEC) 5 MG tablet, Take 5 mg by mouth daily., Disp: , Rfl:    esomeprazole (NEXIUM) 20 MG capsule, Take 20 mg by mouth daily at 12 noon., Disp: , Rfl:    ezetimibe (ZETIA) 10 MG tablet, Take 1 tablet (10 mg total) by mouth daily., Disp: 90 tablet, Rfl: 3   famotidine (PEPCID) 20 MG tablet, TAKE 1 TABLET BY MOUTH TWO  TIMES DAILY, Disp: 180 tablet, Rfl: 1   furosemide (LASIX) 20 MG tablet, Take 1 tablet (20 mg total) by mouth daily. As needed for fluid retention, Disp: 30 tablet, Rfl: 0   Multiple Vitamin (MULTIVITAMIN) tablet, Take 1 tablet by mouth daily., Disp: , Rfl:     olmesartan (BENICAR) 40 MG tablet, Take 1 tablet (40 mg total) by mouth daily., Disp: 90 tablet, Rfl: 3   PARoxetine (PAXIL) 20 MG tablet, Take 1 tablet (20 mg total) by mouth daily., Disp: 90 tablet, Rfl: 1   sertraline (ZOLOFT) 50 MG tablet, Take 1 tablet (50 mg total) by mouth daily., Disp: 30 tablet, Rfl: 3  Social History   Tobacco Use  Smoking Status Never  Smokeless Tobacco Never    No Known Allergies Objective:  There were no vitals filed for this visit. There is no height or weight on file to calculate BMI. Constitutional Well developed. Well nourished.  Vascular Dorsalis pedis pulses palpable bilaterally. Posterior tibial pulses palpable bilaterally. Capillary refill normal to all digits.  No cyanosis or clubbing noted. Pedal hair growth normal.  Neurologic Normal speech. Oriented to person, place, and time. Epicritic sensation to light touch grossly present bilaterally.  Dermatologic Bilateral severe dryness with skin fissures noted to the forefoot.  Mild pain on palpation.  No open wounds or lesions noted.  Orthopedic: Normal joint ROM without pain or crepitus bilaterally. No visible deformities. No bony tenderness.   Radiographs: None Assessment:   1. Xerosis of skin  Plan:  Patient was evaluated and treated and all questions answered.  Bilateral severe xerosis -I explained to the patient the etiology of xerosis and various treatment options were extensively discussed.  I explained to the patient the importance of maintaining moisturization of the skin with application of over-the-counter lotion such as Eucerin or Luciderm.  Given that she has failed over-the-counter options she will benefit from ammonium lactate to be applied twice a day.  Ammonium lactate was sent to the pharmacy.  No follow-ups on file.

## 2022-10-01 ENCOUNTER — Encounter: Payer: Self-pay | Admitting: Internal Medicine

## 2022-10-03 ENCOUNTER — Ambulatory Visit: Payer: Medicare HMO | Admitting: Dermatology

## 2022-10-05 ENCOUNTER — Encounter: Payer: Self-pay | Admitting: Internal Medicine

## 2022-10-12 ENCOUNTER — Ambulatory Visit: Payer: Medicare HMO | Admitting: Dermatology

## 2022-10-12 ENCOUNTER — Encounter: Payer: Self-pay | Admitting: Dermatology

## 2022-10-12 VITALS — BP 140/68 | HR 66

## 2022-10-12 DIAGNOSIS — L814 Other melanin hyperpigmentation: Secondary | ICD-10-CM

## 2022-10-12 DIAGNOSIS — D229 Melanocytic nevi, unspecified: Secondary | ICD-10-CM

## 2022-10-12 DIAGNOSIS — I8393 Asymptomatic varicose veins of bilateral lower extremities: Secondary | ICD-10-CM | POA: Diagnosis not present

## 2022-10-12 DIAGNOSIS — L918 Other hypertrophic disorders of the skin: Secondary | ICD-10-CM

## 2022-10-12 DIAGNOSIS — D692 Other nonthrombocytopenic purpura: Secondary | ICD-10-CM

## 2022-10-12 DIAGNOSIS — I781 Nevus, non-neoplastic: Secondary | ICD-10-CM | POA: Diagnosis not present

## 2022-10-12 DIAGNOSIS — D2371 Other benign neoplasm of skin of right lower limb, including hip: Secondary | ICD-10-CM | POA: Diagnosis not present

## 2022-10-12 DIAGNOSIS — D224 Melanocytic nevi of scalp and neck: Secondary | ICD-10-CM

## 2022-10-12 DIAGNOSIS — L820 Inflamed seborrheic keratosis: Secondary | ICD-10-CM

## 2022-10-12 DIAGNOSIS — W908XXA Exposure to other nonionizing radiation, initial encounter: Secondary | ICD-10-CM | POA: Diagnosis not present

## 2022-10-12 DIAGNOSIS — D2239 Melanocytic nevi of other parts of face: Secondary | ICD-10-CM

## 2022-10-12 DIAGNOSIS — L578 Other skin changes due to chronic exposure to nonionizing radiation: Secondary | ICD-10-CM | POA: Diagnosis not present

## 2022-10-12 DIAGNOSIS — L821 Other seborrheic keratosis: Secondary | ICD-10-CM

## 2022-10-12 DIAGNOSIS — D239 Other benign neoplasm of skin, unspecified: Secondary | ICD-10-CM

## 2022-10-12 DIAGNOSIS — Z85828 Personal history of other malignant neoplasm of skin: Secondary | ICD-10-CM

## 2022-10-12 DIAGNOSIS — Z872 Personal history of diseases of the skin and subcutaneous tissue: Secondary | ICD-10-CM

## 2022-10-12 DIAGNOSIS — Z1283 Encounter for screening for malignant neoplasm of skin: Secondary | ICD-10-CM | POA: Diagnosis not present

## 2022-10-12 NOTE — Patient Instructions (Addendum)
Seborrheic Keratosis      What causes seborrheic keratoses? Seborrheic keratoses are harmless, common skin growths that first appear during adult life.  As time goes by, more growths appear.  Some people may develop a large number of them.  Seborrheic keratoses appear on both covered and uncovered body parts.  They are not caused by sunlight.  The tendency to develop seborrheic keratoses can be inherited.  They vary in color from skin-colored to gray, brown, or even black.  They can be either smooth or have a rough, warty surface.   Seborrheic keratoses are superficial and look as if they were stuck on the skin.  Under the microscope this type of keratosis looks like layers upon layers of skin.  That is why at times the top layer may seem to fall off, but the rest of the growth remains and re-grows.    Treatment Seborrheic keratoses do not need to be treated, but can easily be removed in the office.  Seborrheic keratoses often cause symptoms when they rub on clothing or jewelry.  Lesions can be in the way of shaving.  If they become inflamed, they can cause itching, soreness, or burning.  Removal of a seborrheic keratosis can be accomplished by freezing, burning, or surgery. If any spot bleeds, scabs, or grows rapidly, please return to have it checked, as these can be an indication of a skin cancer.   Cryotherapy Aftercare  Wash gently with soap and water everyday.   Apply Vaseline and Band-Aid daily until healed.      Melanoma ABCDEs    Melanoma is the most dangerous type of skin cancer, and is the leading cause of death from skin disease.  You are more likely to develop melanoma if you: Have light-colored skin, light-colored eyes, or red or blond hair Spend a lot of time in the sun Tan regularly, either outdoors or in a tanning bed Have had blistering sunburns, especially during childhood Have a close family member who has had a melanoma Have atypical moles or large  birthmarks  Early detection of melanoma is key since treatment is typically straightforward and cure rates are extremely high if we catch it early.   The first sign of melanoma is often a change in a mole or a new dark spot.  The ABCDE system is a way of remembering the signs of melanoma.  A for asymmetry:  The two halves do not match. B for border:  The edges of the growth are irregular. C for color:  A mixture of colors are present instead of an even brown color. D for diameter:  Melanomas are usually (but not always) greater than 6mm - the size of a pencil eraser. E for evolution:  The spot keeps changing in size, shape, and color.  Please check your skin once per month between visits. You can use a small mirror in front and a large mirror behind you to keep an eye on the back side or your body.   If you see any new or changing lesions before your next follow-up, please call to schedule a visit.  Please continue daily skin protection including broad spectrum sunscreen SPF 30+ to sun-exposed areas, reapplying every 2 hours as needed when you're outdoors.   Staying in the shade or wearing long sleeves, sun glasses (UVA+UVB protection) and wide brim hats (4-inch brim around the entire circumference of the hat) are also recommended for sun protection.    Due to recent changes in healthcare laws,  you may see results of your pathology and/or laboratory studies on MyChart before the doctors have had a chance to review them. We understand that in some cases there may be results that are confusing or concerning to you. Please understand that not all results are received at the same time and often the doctors may need to interpret multiple results in order to provide you with the best plan of care or course of treatment. Therefore, we ask that you please give [place] 2 business days to thoroughly review all your results before contacting the office for clarification. Should we see a critical lab result, you  will be contacted sooner.   If You Need Anything After Your Visit  If you have any questions or concerns for your doctor, please call our main line at [phone] and press option 4 to reach your doctor's medical assistant. If no one answers, please leave a voicemail as directed and we will return your call as soon as possible. Messages left after 4 pm will be answered the following business day.   You may also send [place] a message via MyChart. We typically respond to MyChart messages within 1-2 business days.  For prescription refills, please ask your pharmacy to contact our office. Our fax number is [phone].  If you have an urgent issue when the clinic is closed that cannot wait until the next business day, you can page your doctor at the number below.    Please note that while we do our best to be available for urgent issues outside of office hours, we are not available 24/7.   If you have an urgent issue and are unable to reach [place], you may choose to seek medical care at your doctor's office, retail clinic, urgent care center, or emergency room.  If you have a medical emergency, please immediately call 911 or go to the emergency department.  Pager Numbers  - Dr. Gwen Pounds: [phone]  - Dr. Roseanne Reno: [phone]  In the event of inclement weather, please call our main line at [phone] for an update on the status of any delays or closures.  Dermatology Medication Tips: Please keep the boxes that topical medications come in in order to help keep track of the instructions about where and how to use these. Pharmacies typically print the medication instructions only on the boxes and not directly on the medication tubes.   If your medication is too expensive, please contact our office at [phone] option 4 or send [place] a message through MyChart.   We are unable to tell what your co-pay for medications will be in advance as this is different depending on your insurance coverage.  However, we may be able to find a substitute medication at lower cost or fill out paperwork to get insurance to cover a needed medication.   If a prior authorization is required to get your medication covered by your insurance company, please allow [place] 1-2 business days to complete this process.  Drug prices often vary depending on where the prescription is filled and some pharmacies may offer cheaper prices.  The website www.goodrx.com contains coupons for medications through different pharmacies. The prices here do not account for what the cost may be with help from insurance (it may be cheaper with your insurance), but the website can give you the price if you did not use any insurance.  - You can print the associated coupon and take it with your prescription to the pharmacy.  - You may also stop  by our office during regular business hours and pick up a GoodRx coupon card.  - If you need your prescription sent electronically to a different pharmacy, notify our office through [facility] or by phone at [phone] option 4.     Si Usted Necesita Algo Despus de Su Visita  Tambin puede enviarnos un mensaje a travs de Clinical cytogeneticist. Por lo general respondemos a los mensajes de MyChart en el transcurso de 1 a 2 das hbiles.  Para renovar recetas, por favor pida a su farmacia que se ponga en contacto con nuestra oficina. Annie Sable de fax es [place] [phone].  Si tiene un asunto urgente cuando la clnica est cerrada y que no puede esperar hasta el siguiente da hbil, puede llamar/localizar a su doctor(a) al nmero que aparece a continuacin.   Por favor, tenga en cuenta que aunque hacemos todo lo posible para estar disponibles para asuntos urgentes fuera del horario de [place], no estamos disponibles las 24 horas del da, los 7 [address]  [address] de la [place].   Si tiene un problema urgente y no puede comunicarse con nosotros, puede optar por buscar atencin mdica  en el consultorio de su  doctor(a), en una clnica privada, en un centro de atencin urgente o en una sala de emergencias.  Si tiene Engineer, drilling, por favor llame inmediatamente al 911 o vaya a la sala de emergencias.  Nmeros de bper  - Dr. Gwen Pounds: [phone]  - Dra. Roseanne Reno: [phone]  En caso de inclemencias del [place], por favor llame a Lacy Duverney principal al [phone] para una actualizacin sobre el [place] de cualquier retraso o cierre.  Consejos para la medicacin en dermatologa: Por favor, guarde las cajas en las que vienen los medicamentos de uso tpico para ayudarle a seguir las instrucciones sobre dnde y cmo usarlos. Las farmacias generalmente imprimen las instrucciones del medicamento slo en las cajas y no directamente en los tubos del [place].   Si su medicamento es muy caro, por favor, pngase en contacto con Rolm Gala llamando al [phone] y presione la opcin 4 o envenos un mensaje a travs de Clinical cytogeneticist.   No podemos decirle cul ser su copago por los medicamentos por adelantado ya que esto es diferente dependiendo de la cobertura de su seguro. Sin embargo, es posible que podamos encontrar un medicamento sustituto a Audiological scientist un formulario para que el seguro cubra el medicamento que se considera necesario.   Si se requiere una autorizacin previa para que su compaa de seguros [place] su medicamento, por favor permtanos de 1 a 2 das hbiles para completar [address]  Los precios de los medicamentos varan con frecuencia dependiendo del Environmental consultant de dnde se surte la receta y alguna farmacias pueden ofrecer precios ms baratos.  El sitio web www.goodrx.com tiene cupones para medicamentos de Health and safety inspector. Los precios aqu no tienen en cuenta lo que podra costar con la ayuda del seguro (puede ser ms barato con su seguro), pero el sitio web puede darle el precio si no utiliz Tourist information centre manager.  - Puede imprimir el cupn correspondiente y llevarlo  con su receta a la farmacia.  - Tambin puede pasar por nuestra oficina durante el horario de atencin regular y Education officer, museum una tarjeta de cupones de GoodRx.  - Si necesita que su receta se enve electrnicamente a una farmacia diferente, informe a nuestra oficina a travs de MyChart de [place] o por telfono llamando al [phone] y presione la opcin 4.

## 2022-10-12 NOTE — Progress Notes (Signed)
Follow-Up Visit   Subjective  Grace [name] is a 77 y.o. female who presents for the following: Skin Cancer Screening and Full Body Skin Exam hx of isks , hx of aks, hx of bcc   She has a couple irritated spots on her face. Also some bruising at arms  The patient presents for Total-Body Skin Exam (TBSE) for skin cancer screening and mole check. The patient has spots, moles and lesions to be evaluated, some may be new or changing and the patient may have concern these could be cancer.    The following portions of the chart were reviewed this encounter and updated as appropriate: medications, allergies, medical history  Review of Systems:  No other skin or systemic complaints except as noted in HPI or Assessment and Plan.  Objective  Well appearing patient in no apparent distress; mood and affect are within normal limits.  A full examination was performed including scalp, head, eyes, ears, nose, lips, neck, chest, axillae, abdomen, back, buttocks, bilateral upper extremities, bilateral lower extremities, hands, feet, fingers, toes, fingernails, and toenails. All findings within normal limits unless otherwise noted below.   Relevant physical exam findings are noted in the Assessment and Plan.  Left Temple x 1, right forehead x 1 (2) Erythematous stuck-on, waxy papule or plaque    Assessment & Plan   SKIN CANCER SCREENING PERFORMED TODAY.  ACTINIC DAMAGE - Chronic condition, secondary to cumulative UV/sun exposure - diffuse scaly erythematous macules with underlying dyspigmentation - Recommend daily broad spectrum sunscreen SPF 30+ to sun-exposed areas, reapply every 2 hours as needed.  - Staying in the shade or wearing long sleeves, sun glasses (UVA+UVB protection) and wide brim hats (4-inch brim around the entire circumference of the hat) are also recommended for sun protection.  - Call for new or changing lesions.  LENTIGINES, SEBORRHEIC KERATOSES, HEMANGIOMAS - Benign  normal skin lesions - Benign-appearing - Call for any changes  MELANOCYTIC NEVI At face - Tan-brown and/or pink-flesh-colored symmetric macules and papules - Vertex scalp- 4.70mm firm flesh papule - Benign appearing on exam today - Observation - Call clinic for new or changing moles - Recommend daily use of broad spectrum spf 30+ sunscreen to sun-exposed areas.    DERMATOFIBROMA Right lower knee x 2 Exam: Firm pink/brown papulenodule with dimple sign. Treatment Plan: A dermatofibroma is a benign growth possibly related to trauma, such as an insect bite, cut from shaving, or inflamed acne-type bump.  Treatment options to remove include shave or excision with resulting scar and risk of recurrence.  Since benign-appearing and not bothersome, will observe for now.   Varicose Veins/Spider Veins - Dilated blue, purple or red veins at the lower extremities - Reassured - Smaller vessels can be treated by sclerotherapy (a procedure to inject a medicine into the veins to make them disappear) if desired, but the treatment is not covered by insurance. Larger vessels may be covered if symptomatic and we would refer to vascular surgeon if treatment desired.  Acrochordons (Skin Tags) At neck  - Fleshy, skin-colored pedunculated papules - Benign appearing.  - Observe. - If desired, they can be removed with an in office procedure that is not covered by insurance. - Please call the clinic if you notice any new or changing lesions.  Purpura - Chronic; persistent and recurrent.  Treatable, but not curable. 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  - Call if any new or changing lesions are noted between office visits     Inflamed seborrheic keratosis (2) Left Temple x 1, right forehead x 1  Symptomatic, irritating, patient would like treated.  Destruction of lesion - Left Temple x 1, right forehead x 1 (2)  Destruction method: cryotherapy   Informed consent: discussed and consent obtained   Lesion destroyed using liquid nitrogen: Yes   Region frozen until ice ball extended beyond lesion: Yes   Outcome: patient tolerated procedure well with no complications   Post-procedure details: wound care instructions given   Additional details:  Prior to procedure, discussed risks of blister formation, small wound, skin dyspigmentation, or rare scar following cryotherapy. Recommend Vaseline ointment to treated areas while healing.    Return in about 1 year (around 10/12/2023) for TBSE.  I, Asher Muir, CMA, am acting as scribe for Willeen Niece, MD.   Documentation: I have reviewed the above documentation for accuracy and completeness, and I agree with the above.  Willeen Niece, MD

## 2022-10-16 ENCOUNTER — Encounter: Payer: Self-pay | Admitting: Internal Medicine

## 2022-10-26 ENCOUNTER — Encounter: Payer: Self-pay | Admitting: Internal Medicine

## 2022-11-02 ENCOUNTER — Encounter: Payer: Self-pay | Admitting: Internal Medicine

## 2022-11-02 DIAGNOSIS — R2241 Localized swelling, mass and lump, right lower limb: Secondary | ICD-10-CM

## 2022-11-03 NOTE — Telephone Encounter (Signed)
Patient says spots in picture have faded but the spots on back of calf of right leg even though have faded but there feels like a small tender area with a hard knot at site of rash or spots . Patient says only tender to touch does not hurt to walk or bend. Warm  but not hurt to touch patient just asking should she do anything besides elevation and compression hose.

## 2022-11-06 NOTE — Telephone Encounter (Signed)
Pt called back to make an appt. Pt is scheduled to see Grace [name] on 11/08/22 for red spot on legs.

## 2022-11-07 ENCOUNTER — Ambulatory Visit
Admission: RE | Admit: 2022-11-07 | Discharge: 2022-11-07 | Disposition: A | Discharge status: Home / Self Care | Payer: Medicare HMO | Source: Ambulatory Visit | Attending: Internal Medicine | Admitting: Internal Medicine

## 2022-11-07 DIAGNOSIS — M79604 Pain in right leg: Secondary | ICD-10-CM | POA: Diagnosis not present

## 2022-11-07 DIAGNOSIS — R2241 Localized swelling, mass and lump, right lower limb: Secondary | ICD-10-CM | POA: Insufficient documentation

## 2022-11-07 DIAGNOSIS — R600 Localized edema: Secondary | ICD-10-CM | POA: Diagnosis not present

## 2022-11-07 NOTE — Telephone Encounter (Signed)
Patient scheduled appt not able to reach until this mornng called and patient, patien t has no pain in leg and rash is better.right leg

## 2022-11-08 ENCOUNTER — Encounter: Payer: Self-pay | Admitting: Nurse Practitioner

## 2022-11-08 ENCOUNTER — Ambulatory Visit (INDEPENDENT_AMBULATORY_CARE_PROVIDER_SITE_OTHER): Payer: Medicare HMO | Admitting: Nurse Practitioner

## 2022-11-08 VITALS — BP 110/86 | HR 54 | Temp 98.6°F | Ht 65.0 in | Wt 150.0 lb

## 2022-11-08 DIAGNOSIS — R2241 Localized swelling, mass and lump, right lower limb: Secondary | ICD-10-CM | POA: Insufficient documentation

## 2022-11-08 NOTE — Progress Notes (Signed)
Bethanie Dicker, NP-C Phone: [phone]  Grace [name] is a 77 y.o. female who presents today for knot on right calf.   Patient with knot on right calf x 10-14 days. She reports it was initially red but that has since resolved. She has been elevating her leg and wearing compression hose. She feels that the palpable knot has decreased in size. She notes mild tenderness on palpation. No warmth. Denies fever/chills. Denies pain with walking. She had an ultrasound yesterday to evaluate the area for a DVT.   IMPRESSION: 1. No evidence of DVT within the right lower extremity. 2. Sonographic evaluation of the patient's palpable area of concern within the right calf correlates with an indeterminate 1.0 cm serpiginous apparent fluid collection, nonspecific with differential considerations including hematoma, seroma and abscess. Clinical correlation is advised.  Social History   Tobacco Use  Smoking Status Never  Smokeless Tobacco Never    Current Outpatient Medications on File Prior to Visit  Medication Sig Dispense Refill   amLODipine (NORVASC) 2.5 MG tablet Take 2.5 mg by mouth every evening.     cetirizine (ZYRTEC) 5 MG tablet Take 5 mg by mouth daily.     esomeprazole (NEXIUM) 20 MG capsule Take 20 mg by mouth daily at 12 noon.     Multiple Vitamin (MULTIVITAMIN) tablet Take 1 tablet by mouth daily.     [DISCONTINUED] ezetimibe (ZETIA) 10 MG tablet Take 1 tablet (10 mg total) by mouth daily. 90 tablet 3   [DISCONTINUED] famotidine (PEPCID) 20 MG tablet TAKE 1 TABLET BY MOUTH TWO  TIMES DAILY 180 tablet 1   [DISCONTINUED] furosemide (LASIX) 20 MG tablet Take 1 tablet (20 mg total) by mouth daily. As needed for fluid retention 30 tablet 0   [DISCONTINUED] olmesartan (BENICAR) 40 MG tablet Take 1 tablet (40 mg total) by mouth daily. 90 tablet 3   [DISCONTINUED] PARoxetine (PAXIL) 20 MG tablet Take 1 tablet (20 mg total) by mouth daily. 90 tablet 1   No current facility-administered  medications on file prior to visit.    ROS see history of present illness  Objective  Physical Exam Vitals:   11/08/22 0810  BP: 110/86  Pulse: (!) 54  Temp: 98.6 F (37 C)  SpO2: 95%    BP Readings from Last 3 Encounters:  11/08/22 110/86  10/12/22 (!) 140/68  09/18/22 128/78   Wt Readings from Last 3 Encounters:  11/08/22 150 lb (68 kg)  09/18/22 155 lb 12.8 oz (70.7 kg)  02/09/22 152 lb 3.2 oz (69 kg)    Physical Exam Constitutional:      General: She is not in acute distress.    Appearance: Normal appearance.  HENT:     Head: Normocephalic.  Cardiovascular:     Rate and Rhythm: Normal rate and regular rhythm.     Heart sounds: Normal heart sounds.  Pulmonary:     Effort: Pulmonary effort is normal.     Breath sounds: Normal breath sounds.  Skin:    General: Skin is warm and dry.     Comments: Small palpable knot noted on posterior right calf. No erythema or warmth present. Mild tenderness.   Neurological:     General: No focal deficit present.     Mental Status: She is alert.  Psychiatric:        Mood and Affect: Mood normal.        Behavior: Behavior normal.    Assessment/Plan: Please see individual problem list.  Lump of skin of  lower extremity, right Assessment & Plan: Palpable area consistent with seroma. [place] negative for DVT, not clinically consistent with abscess. Improving in size. No erythema or warmth noted on exam. She will continue to wear her compression hose as directed. She will monitor the area for any changing, worsening symptoms or signs of infection. Strict precautions given to patient.    Return if symptoms worsen or fail to improve.   Bethanie Dicker, NP-C [facility] [employer]

## 2022-11-08 NOTE — Assessment & Plan Note (Addendum)
Palpable area consistent with seroma. [place] negative for DVT, not clinically consistent with abscess. Improving in size. No erythema or warmth noted on exam. She will continue to wear her compression hose as directed. She will monitor the area for any changing, worsening symptoms or signs of infection. Strict precautions given to patient.

## 2022-11-13 ENCOUNTER — Encounter: Payer: Self-pay | Admitting: Internal Medicine

## 2022-11-14 ENCOUNTER — Emergency Department: Payer: Medicare HMO

## 2022-11-14 ENCOUNTER — Encounter: Payer: Self-pay | Admitting: Internal Medicine

## 2022-11-14 ENCOUNTER — Emergency Department
Admission: EM | Admit: 2022-11-14 | Discharge: 2022-11-14 | Disposition: A | Discharge status: Home / Self Care | Payer: Medicare HMO | Attending: Emergency Medicine | Admitting: Emergency Medicine

## 2022-11-14 ENCOUNTER — Other Ambulatory Visit: Payer: Self-pay

## 2022-11-14 DIAGNOSIS — L03115 Cellulitis of right lower limb: Secondary | ICD-10-CM | POA: Insufficient documentation

## 2022-11-14 DIAGNOSIS — I10 Essential (primary) hypertension: Secondary | ICD-10-CM | POA: Insufficient documentation

## 2022-11-14 DIAGNOSIS — R600 Localized edema: Secondary | ICD-10-CM | POA: Diagnosis not present

## 2022-11-14 DIAGNOSIS — M7121 Synovial cyst of popliteal space [Baker], right knee: Secondary | ICD-10-CM | POA: Diagnosis not present

## 2022-11-14 DIAGNOSIS — M79661 Pain in right lower leg: Secondary | ICD-10-CM | POA: Diagnosis not present

## 2022-11-14 DIAGNOSIS — M79604 Pain in right leg: Secondary | ICD-10-CM | POA: Diagnosis not present

## 2022-11-14 LAB — BASIC METABOLIC PANEL
Anion gap: 10 (ref 5–15)
BUN: 17 mg/dL (ref 8–23)
CO2: 24 mmol/L (ref 22–32)
Calcium: 9 mg/dL (ref 8.9–10.3)
Chloride: 105 mmol/L (ref 98–111)
Creatinine, Ser: 0.95 mg/dL (ref 0.44–1.00)
GFR, Estimated: >60 mL/min (ref >60)
Glucose, Bld: 98 mg/dL (ref 70–99)
Potassium: 3.9 mmol/L (ref 3.5–5.1)
Sodium: 139 mmol/L (ref 135–145)

## 2022-11-14 LAB — CBC WITH DIFFERENTIAL/PLATELET
Abs Immature Granulocytes: 0.02 10*3/uL (ref 0.00–0.07)
Basophils Absolute: 0.1 10*3/uL (ref 0.0–0.1)
Basophils Relative: 1 %
Eosinophils Absolute: 0.2 10*3/uL (ref 0.0–0.5)
Eosinophils Relative: 2 %
HCT: 37.9 % (ref 36.0–46.0)
Hemoglobin: 12.9 g/dL (ref 12.0–15.0)
Immature Granulocytes: 0 %
Lymphocytes Relative: 31 %
Lymphs Abs: 2.7 10*3/uL (ref 0.7–4.0)
MCH: 32.2 pg (ref 26.0–34.0)
MCHC: 34 g/dL (ref 30.0–36.0)
MCV: 94.5 fL (ref 80.0–100.0)
Monocytes Absolute: 0.6 10*3/uL (ref 0.1–1.0)
Monocytes Relative: 7 %
Neutro Abs: 5.2 10*3/uL (ref 1.7–7.7)
Neutrophils Relative %: 59 %
Platelets: 212 10*3/uL (ref 150–400)
RBC: 4.01 MIL/uL (ref 3.87–5.11)
RDW: 12.8 % (ref 11.5–15.5)
WBC: 8.8 10*3/uL (ref 4.0–10.5)
nRBC: 0 % (ref 0.0–0.2)

## 2022-11-14 MED ORDER — CEPHALEXIN 500 MG PO CAPS: 500.0000 mg | ORAL_CAPSULE | Freq: Two times a day (BID) | ORAL | 0 refills | Status: DC

## 2022-11-14 NOTE — ED Triage Notes (Signed)
Pt to ED for RLE redness, pain and swelling since Sat (3d ago). Last week pt had same to RLE back of leg, had U/S and no DVT found. No abx taken. U/S revealed could vbe a cyst or "water". RLE is swollen, red to front. No head. Pt describes feeling of soreness. Only hurts when touched. No known injury.

## 2022-11-14 NOTE — ED Provider Notes (Signed)
[facility] Provider Note    Event Date/Time   First MD Initiated Contact with Patient 11/14/22 1603     (approximate)  History   Chief Complaint: Leg Pain  HPI  Grace [name] is a 77 y.o. female with a past medical history of depression, gastric reflux, hypertension, hyperlipidemia, presents to the emergency department for right lower extremity pain.  According to the patient a little over a week ago she was having pain and a red swollen area to the posterior aspect of her leg/calf.  She saw her doctor who had an ultrasound ordered at that time showing a fluid collection possible abscess versus seroma.  She states the area resolved on its own however over the last 2 days or so now she has developed a red and swollen area to the medial to anterior portion of the leg distinct from the prior area.  She saw her doctor who sent her back to the emergency department for an ultrasound to rule out a blood clot.  No history of DVT in the past.  Denies any fever.  Physical Exam   Triage Vital Signs: ED Triage Vitals  Encounter Vitals Group     BP 11/14/22 1537 (!) 147/91     Systolic BP Percentile --      Diastolic BP Percentile --      Pulse Rate 11/14/22 1537 68     Resp 11/14/22 1537 17     Temp 11/14/22 1537 98.7 F (37.1 C)     Temp src --      SpO2 11/14/22 1537 96 %     Weight 11/14/22 1538 149 lb 14.6 oz (68 kg)     Height 11/14/22 1538 5\' 5"  (1.651 m)     Head Circumference --      Peak Flow --      Pain Score 11/14/22 1535 0     Pain Loc --      Pain Education --      Exclude from Growth Chart --     Most recent vital signs: Vitals:   11/14/22 1537  BP: (!) 147/91  Pulse: 68  Resp: 17  Temp: 98.7 F (37.1 C)  SpO2: 96%    General: Awake, no distress.  CV:  Good peripheral perfusion.  Regular rate and rhythm  Resp:  Normal effort.  Equal breath sounds bilaterally.  Abd:  No distention.  Soft, nontender.  No rebound or  guarding. Other:  Patient has approximate 2 x 6 cm area of erythema and tenderness to the medial to anterior aspect of her right lower extremity, mid tibial area.  Neuro vastly intact distally.   ED Results / Procedures / Treatments    RADIOLOGY  Ultrasound negative for DVT does show a Baker's cyst as well as the area of concern as being subcutaneous edema without a drainable collection.   MEDICATIONS ORDERED IN ED: Medications - No data to display   IMPRESSION / MDM / ASSESSMENT AND PLAN / ED COURSE  I reviewed the triage vital signs and the nursing notes.  Patient's presentation is most consistent with acute presentation with potential threat to life or bodily function.  Patient presents emergency department for right leg pain redness swelling to a small/discrete area.  Differential would include seroma, abscess, cellulitis, DVT, superficial thrombophlebitis.  We obtain a right lower quadrant ultrasound to further evaluate.  CBC is normal with a normal white blood cell count chemistry is reassuring.  If ultrasound is  negative for DVT we will cover with antibiotics as a precaution.  Patient agreeable to plan of care.  Ultrasound negative for DVT.  Will cover the patient with antibiotics for possible cellulitis to this area of concern.  Will have the patient follow-up with her doctor for recheck.  Patient agreeable to plan.  FINAL CLINICAL IMPRESSION(S) / ED DIAGNOSES   Right lower extremity pain Cellulitis  Note:  This document was prepared using Dragon voice recognition software and may include unintentional dictation errors.   Minna Antis, MD 11/14/22 [phone]

## 2022-11-14 NOTE — Telephone Encounter (Signed)
Spoke to pt, pt stated she noticed this Friday or Saturday. Advise pt given the time of day she needs to be seen at the urgent care or ER to rule out any emergent possibilities. Pt stated she would head to Urgent care informed pt we would reach back out tomorrow to see how's she doing

## 2022-11-15 ENCOUNTER — Other Ambulatory Visit: Payer: Self-pay | Admitting: Internal Medicine

## 2022-11-15 ENCOUNTER — Encounter: Payer: Self-pay | Admitting: Internal Medicine

## 2022-11-15 DIAGNOSIS — L52 Erythema nodosum: Secondary | ICD-10-CM

## 2022-11-15 DIAGNOSIS — R2241 Localized swelling, mass and lump, right lower limb: Secondary | ICD-10-CM

## 2022-11-15 NOTE — Telephone Encounter (Signed)
noted 

## 2022-11-15 NOTE — Telephone Encounter (Signed)
Patient just called. I set her an appoinment with Dr. Evelene Croon on Friday.

## 2022-11-15 NOTE — Assessment & Plan Note (Signed)
Recurrent ,  DVT ruled out both times  ,  has the appearance of erythema nodosum.  Screening for potential infections causes recommended if condition does not resolve with empiric abx (cephalexin) given by ER physician

## 2022-11-17 ENCOUNTER — Other Ambulatory Visit (INDEPENDENT_AMBULATORY_CARE_PROVIDER_SITE_OTHER): Payer: Medicare HMO

## 2022-11-17 ENCOUNTER — Inpatient Hospital Stay: Payer: Medicare HMO | Admitting: Nurse Practitioner

## 2022-11-17 DIAGNOSIS — L52 Erythema nodosum: Secondary | ICD-10-CM

## 2022-11-17 NOTE — Addendum Note (Signed)
Addended by: Jarvis Morgan D on: 11/17/2022 02:54 PM   Modules accepted: Orders

## 2022-11-20 LAB — QUANTIFERON-TB GOLD PLUS
Mitogen-NIL: 7.27 [IU]/mL
NIL: 0.01 [IU]/mL
QuantiFERON-TB Gold Plus: NEGATIVE
TB1-NIL: 0.01 [IU]/mL
TB2-NIL: 0.02 [IU]/mL

## 2022-11-20 LAB — ANTISTREPTOLYSIN O TITER: ASO: 55 [IU]/mL (ref <200)

## 2022-11-27 LAB — CBC WITH DIFFERENTIAL/PLATELET
Basophils Absolute: 0.1 10*3/uL (ref 0.0–0.2)
Basos: 1 %
EOS (ABSOLUTE): 0.2 10*3/uL (ref 0.0–0.4)
Eos: 2 %
Hematocrit: 36.9 % (ref 34.0–46.6)
Hemoglobin: 12.4 g/dL (ref 11.1–15.9)
Immature Grans (Abs): 0 10*3/uL (ref 0.0–0.1)
Immature Granulocytes: 0 %
Lymphocytes Absolute: 2.5 10*3/uL (ref 0.7–3.1)
Lymphs: 35 %
MCH: 32 pg (ref 26.6–33.0)
MCHC: 33.6 g/dL (ref 31.5–35.7)
MCV: 95 fL (ref 79–97)
Monocytes Absolute: 0.6 10*3/uL (ref 0.1–0.9)
Monocytes: 8 %
Neutrophils Absolute: 3.9 10*3/uL (ref 1.4–7.0)
Neutrophils: 54 %
Platelets: 237 10*3/uL (ref 150–450)
RBC: 3.87 x10E6/uL (ref 3.77–5.28)
RDW: 12.5 % (ref 11.7–15.4)
WBC: 7.2 10*3/uL (ref 3.4–10.8)

## 2022-11-27 LAB — LEPTOSPIRA AB SCREEN

## 2022-11-27 LAB — C-REACTIVE PROTEIN: CRP: <1 mg/L (ref 0–10)

## 2022-11-27 LAB — BRUCELLA ANTIBODY IGM, EIA: Brucella Antibody IgM, EIA: NEGATIVE

## 2022-11-27 LAB — SEDIMENTATION RATE: Sed Rate: 2 mm/h (ref 0–40)

## 2022-11-29 ENCOUNTER — Telehealth: Payer: Self-pay | Admitting: *Deleted

## 2022-11-29 NOTE — Telephone Encounter (Signed)
Transition Care Management Follow-up Telephone Call Date of discharge and from where: [facility]  11/14/2022 How have you been since you were released from the hospital? Feeling much better had more tests they say there is no infection just prone to getting bumps  Any questions or concerns? No  Items Reviewed: Did the pt receive and understand the discharge instructions provided? Yes  Medications obtained and verified? Yes  Other? No  Any new allergies since your discharge? No  Dietary orders reviewed? No Do you have support at home? Yes     Follow up appointments reviewed:  PCP Hospital f/u appt confirmed? Yes  Has already seen PCP is doing well with medicine and transportation  Are transportation arrangements needed? No  If their condition worsens, is the pt aware to call PCP or go to the Emergency Dept.? Yes Was the patient provided with contact information for the PCP's office or ED? Yes Was to pt encouraged to call back with questions or concerns? Yes

## 2022-12-04 ENCOUNTER — Ambulatory Visit: Payer: Medicare HMO | Admitting: Internal Medicine

## 2022-12-11 ENCOUNTER — Encounter: Payer: Self-pay | Admitting: Internal Medicine

## 2022-12-12 ENCOUNTER — Encounter: Payer: Self-pay | Admitting: Internal Medicine

## 2022-12-12 ENCOUNTER — Ambulatory Visit (INDEPENDENT_AMBULATORY_CARE_PROVIDER_SITE_OTHER): Payer: Medicare HMO | Admitting: *Deleted

## 2022-12-12 VITALS — Ht 65.0 in | Wt 154.0 lb

## 2022-12-12 DIAGNOSIS — F4321 Adjustment disorder with depressed mood: Secondary | ICD-10-CM | POA: Insufficient documentation

## 2022-12-12 DIAGNOSIS — Z0000 Encounter for general adult medical examination without abnormal findings: Secondary | ICD-10-CM | POA: Diagnosis not present

## 2022-12-12 NOTE — Progress Notes (Signed)
Subjective:   Grace [name] is a 77 y.o. female who presents for an Initial Medicare Annual Wellness Visit.  Visit Complete: Virtual  I connected with  Grace [name] on 12/12/22 by a audio enabled telemedicine application and verified that I am speaking with the correct person using two identifiers.  Patient Location: Home  Provider Location: Office/Clinic  I discussed the limitations of evaluation and management by telemedicine. The patient expressed understanding and agreed to proceed.  Patient Medicare AWV questionnaire was completed by the patient on 12/11/22; I have confirmed that all information answered by patient is correct and no changes since this date.  Because this visit was a virtual/telehealth visit, some criteria may be missing or patient reported. Any vitals not documented were not able to be obtained and vitals that have been documented are patient reported.     Cardiac Risk Factors include: advanced age (>80men, >57 women);dyslipidemia;hypertension     Objective:    Today's Vitals   12/12/22 0816  Weight: 154 lb (69.9 kg)  Height: 5\' 5"  (1.651 m)   Body mass index is 25.63 kg/m.     12/12/2022    8:38 AM 12/12/2022    8:37 AM 11/14/2022    3:37 PM  Advanced Directives  Does Patient Have a Medical Advance Directive? No Yes No  Type of Special educational needs teacher of [place];Living will   Copy of Healthcare Power of Attorney in Chart?  No - copy requested   Would patient like information on creating a medical advance directive? No - Patient declined      Current Medications (verified) Outpatient Encounter Medications as of 12/12/2022  Medication Sig   esomeprazole (NEXIUM) 20 MG capsule Take 20 mg by mouth daily at 12 noon.   levocetirizine (XYZAL) 5 MG tablet Take 5 mg by mouth every evening.   Multiple Vitamin (MULTIVITAMIN) tablet Take 1 tablet by mouth daily.   [DISCONTINUED] ezetimibe (ZETIA) 10 MG tablet Take 1 tablet (10 mg total) by  mouth daily.   [DISCONTINUED] famotidine (PEPCID) 20 MG tablet TAKE 1 TABLET BY MOUTH TWO  TIMES DAILY   [DISCONTINUED] furosemide (LASIX) 20 MG tablet Take 1 tablet (20 mg total) by mouth daily. As needed for fluid retention   [DISCONTINUED] olmesartan (BENICAR) 40 MG tablet Take 1 tablet (40 mg total) by mouth daily.   [DISCONTINUED] PARoxetine (PAXIL) 20 MG tablet Take 1 tablet (20 mg total) by mouth daily.   amLODipine (NORVASC) 2.5 MG tablet Take 2.5 mg by mouth every evening. (Patient not taking: Reported on 12/12/2022)   [DISCONTINUED] cephALEXin (KEFLEX) 500 MG capsule Take 1 capsule (500 mg total) by mouth 2 (two) times daily.   [DISCONTINUED] cetirizine (ZYRTEC) 5 MG tablet Take 5 mg by mouth daily. (Patient not taking: Reported on 12/12/2022)   No facility-administered encounter medications on file as of 12/12/2022.    Allergies (verified) Patient has no known allergies.   History: Past Medical History:  Diagnosis Date   Actinic keratosis    allergic rhinitis    Basal cell carcinoma    Areas of face   BCC (basal cell carcinoma of skin) 07/05/2021   L forearm, [facility]   Benign heart murmur    Depression    resolved   GERD (gastroesophageal reflux disease)    Hx of colonic polyps    Hyperlipidemia    Hypertension    Past Surgical History:  Procedure Laterality Date   ABDOMINAL HYSTERECTOMY     TOTAL ABDOMINAL HYSTERECTOMY  W/ BILATERAL SALPINGOOPHORECTOMY  1991   excessive bleeding   Family History  Problem Relation Age of Onset   Mental illness Mother 24       alzheimers dementia   Cancer Father 52       prostate   Heart disease Father        pacemaker,  no AMI   Social History   Socioeconomic History   Marital status: Widowed    Spouse name: Not on file   Number of children: Not on file   Years of education: Not on file   Highest education level: Bachelor's degree (e.g., BA, AB, BS)  Occupational History   Not on file  Tobacco Use   Smoking status: Never    Smokeless tobacco: Never  Substance and Sexual Activity   Alcohol use: No   Drug use: No   Sexual activity: Not on file  Other Topics Concern   Not on file  Social History Narrative   widow   Social Determinants of Health   Financial Resource Strain: Low Risk  (12/11/2022)   Overall Financial Resource Strain (CARDIA)    Difficulty of Paying Living Expenses: Not hard at all  Food Insecurity: No Food Insecurity (12/11/2022)   Hunger Vital Sign    Worried About Running Out of Food in the Last Year: Never true    Ran Out of Food in the Last Year: Never true  Transportation Needs: No Transportation Needs (12/12/2022)   PRAPARE - Administrator, Civil Service (Medical): No    Lack of Transportation (Non-Medical): No  Physical Activity: Insufficiently Active (12/11/2022)   Exercise Vital Sign    Days of Exercise per Week: 4 days    Minutes of Exercise per Session: 20 min  Stress: Stress Concern Present (12/11/2022)   [employer] of Occupational Health - Occupational Stress Questionnaire    Feeling of Stress : To some extent  Social Connections: Moderately Isolated (12/11/2022)   Social Connection and Isolation Panel [NHANES]    Frequency of Communication with Friends and Family: More than three times a week    Frequency of Social Gatherings with Friends and Family: More than three times a week    Attends Religious Services: More than 4 times per year    Active Member of [employer] or Organizations: No    Attends Banker Meetings: Never    Marital Status: Widowed    Tobacco Counseling Counseling given: Not Answered   Clinical Intake:  Pre-visit preparation completed: Yes  Pain : No/denies pain     BMI - recorded: 25.63 Nutritional Status: BMI 25 -29 Overweight Nutritional Risks: None Diabetes: No  How often do you need to have someone help you when you read instructions, pamphlets, or other written materials from your doctor or pharmacy?: 1 -  Never  Interpreter Needed?: No  Information entered by :: R. Jarelle Ates LPN   Activities of Daily Living    12/11/2022    9:09 AM  In your present state of health, do you have any difficulty performing the following activities:  Hearing? 1  Vision? 0  Difficulty concentrating or making decisions? 0  Walking or climbing stairs? 0  Dressing or bathing? 0  Doing errands, shopping? 0  Preparing Food and eating ? N  Using the Toilet? N  In the past six months, have you accidently leaked urine? Y  Do you have problems with loss of bowel control? N  Managing your Medications? N  Managing your Finances?  N  Housekeeping or managing your Housekeeping? N    Patient Care Team: Sherlene Shams, MD as PCP - General (Internal Medicine)  Indicate any recent Medical Services you may have received from other than [facility] providers in the past year (date may be approximate).     Assessment:   This is a routine wellness examination for Grace [name].  Hearing/Vision screen Hearing Screening - Comments:: Some difficulty Vision Screening - Comments:: glasses   Goals Addressed             This Visit's Progress    Patient Stated       Wants to be more active and maybe do some volunteering       Depression Screen    12/12/2022    8:29 AM 09/18/2022    8:48 AM 02/09/2022    8:50 AM 02/09/2022    8:07 AM 02/02/2021    8:30 AM 08/03/2020    4:53 AM 08/02/2020    8:14 AM  PHQ 2/9 Scores  PHQ - 2 Score 2 3 2 4 2  0 0  PHQ- 9 Score 3 12 6         Fall Risk    12/11/2022    9:09 AM 09/18/2022    8:47 AM 02/09/2022    8:06 AM 02/02/2021    8:29 AM 08/02/2020    8:14 AM  Fall Risk   Falls in the past year? 1 0 0 1 0  Number falls in past yr: 0 0 0 1 0  Injury with Fall? 0 0 0 0 0  Risk for fall due to : History of fall(s);Impaired balance/gait No Fall Risks No Fall Risks    Follow up Falls evaluation completed;Falls prevention discussed Falls evaluation completed Falls evaluation completed Falls  evaluation completed Falls evaluation completed    MEDICARE RISK AT HOME: Medicare Risk at Home Any stairs in or around the home?: Yes If so, are there any without handrails?: No Home free of loose throw rugs in walkways, pet beds, electrical cords, etc?: Yes Adequate lighting in your home to reduce risk of falls?: Yes Life alert?: No Use of a cane, walker or w/c?: No Grab bars in the bathroom?: No Shower chair or bench in shower?: No Elevated toilet seat or a handicapped toilet?: No      Cognitive Function:        12/12/2022    8:39 AM  6CIT Screen  What Year? 0 points  What month? 0 points  What time? 0 points  Count back from 20 0 points  Months in reverse 0 points  Repeat phrase 0 points  Total Score 0 points    Immunizations Immunization History  Administered Date(s) Administered   Influenza-Unspecified 12/11/2012   PFIZER(Purple Top)SARS-COV-2 Vaccination 11/07/2019, 11/28/2019, 05/11/2020   PNEUMOCOCCAL CONJUGATE-20 02/02/2021   Pneumococcal Conjugate-13 05/14/2014   Pneumococcal Polysaccharide-23 12/16/2015   Tdap 02/14/2012   Zoster Recombinant(Shingrix) 02/03/2020, 05/21/2020   Zoster, Live 02/06/2020    TDAP status: Due, Education has been provided regarding the importance of this vaccine. Advised may receive this vaccine at local pharmacy or Health Dept. Aware to provide a copy of the vaccination record if obtained from local pharmacy or Health Dept. Verbalized acceptance and understanding.  Flu Vaccine status: Declined, Education has been provided regarding the importance of this vaccine but patient still declined. Advised may receive this vaccine at local pharmacy or Health Dept. Aware to provide a copy of the vaccination record if obtained from local pharmacy or Health  Dept. Verbalized acceptance and understanding.  Pneumococcal vaccine status: Up to date  Covid-19 vaccine status: Information provided on how to obtain vaccines.   Qualifies for  Shingles Vaccine? Yes   Zostavax completed Yes   Shingrix Completed?: Yes  Screening Tests Health Maintenance  Topic Date Due   DTaP/Tdap/Td (2 - Td or Tdap) 02/13/2022   COVID-19 Vaccine (4 - 2023-24 season) 11/12/2022   INFLUENZA VACCINE  06/11/2023 (Originally 10/12/2022)   Medicare Annual Wellness (AWV)  11/08/2023 (Originally 11-04-1945)   MAMMOGRAM  04/06/2023   Colonoscopy  07/16/2025   Pneumonia Vaccine 14+ Years old  Completed   DEXA SCAN  Completed   Hepatitis C Screening  Completed   Zoster Vaccines- Shingrix  Completed   HPV VACCINES  Aged Out    Health Maintenance  Health Maintenance Due  Topic Date Due   DTaP/Tdap/Td (2 - Td or Tdap) 02/13/2022   COVID-19 Vaccine (4 - 2023-24 season) 11/12/2022    Colorectal cancer screening: Type of screening: Colonoscopy. Completed 07/2020. Repeat every 5 years Patient was advised no longer needs by Dr. Loreta Ave  Mammogram status: Completed 03/2022. Repeat every year  Bone Density status: Completed 10/2020. Results reflect: Bone density results: OSTEOPENIA. Repeat every 2 years. Will discuss with PCP at upcoming visit  Lung Cancer Screening: (Low Dose CT Chest recommended if Age 60-80 years, 20 pack-year currently smoking OR have quit w/in 15years.) does not qualify.    Additional Screening:  Hepatitis C Screening: does qualify; Completed 11/2014  Vision Screening: Recommended annual ophthalmology exams for early detection of glaucoma and other disorders of the eye. Is the patient up to date with their annual eye exam?  Yes  Who is the provider or what is the name of the office in which the patient attends annual eye exams?  [facility]  If pt is not established with a provider, would they like to be referred to a provider to establish care? No .   Dental Screening: Recommended annual dental exams for proper oral hygiene    Community Resource Referral / Chronic Care Management: CRR required this visit?  No   CCM  required this visit?  No     Plan:     I have personally reviewed and noted the following in the patient's chart:   Medical and social history Use of alcohol, tobacco or illicit drugs  Current medications and supplements including opioid prescriptions. Patient is not currently taking opioid prescriptions. Functional ability and status Nutritional status Physical activity Advanced directives List of other physicians Hospitalizations, surgeries, and ER visits in previous 12 months Vitals Screenings to include cognitive, depression, and falls Referrals and appointments  In addition, I have reviewed and discussed with patient certain preventive protocols, quality metrics, and best practice recommendations. A written personalized care plan for preventive services as well as general preventive health recommendations were provided to patient.     Sydell Axon, LPN   [id number]   After Visit Summary: (MyChart) Due to this being a telephonic visit, the after visit summary with patients personalized plan was offered to patient via MyChart   Nurse Notes: None

## 2022-12-12 NOTE — Patient Instructions (Signed)
[name] , Thank you for taking time to come for your Medicare Wellness Visit. I appreciate your ongoing commitment to your health goals. Please review the following plan we discussed and let me know if I can assist you in the future.   Referrals/Orders/Follow-Ups/Clinician Recommendations: Make sure you update your vaccines as discussed  This is a list of the screening recommended for you and due dates:  Health Maintenance  Topic Date Due   DTaP/Tdap/Td vaccine (2 - Td or Tdap) 02/13/2022   COVID-19 Vaccine (4 - 2023-24 season) 11/12/2022   Flu Shot  06/11/2023*   Mammogram  04/06/2023   Medicare Annual Wellness Visit  12/12/2023   Colon Cancer Screening  07/16/2025   Pneumonia Vaccine  Completed   DEXA scan (bone density measurement)  Completed   Hepatitis C Screening  Completed   Zoster (Shingles) Vaccine  Completed   HPV Vaccine  Aged Out  *Topic was postponed. The date shown is not the original due date.    Advanced directives: (Declined) Advance directive discussed with you today. Even though you declined this today, please call our office should you change your mind, and we can give you the proper paperwork for you to fill out.  Next Medicare Annual Wellness Visit scheduled for next year: Yes 12/14/23 @ 8:15

## 2022-12-14 ENCOUNTER — Telehealth: Payer: Self-pay | Admitting: Internal Medicine

## 2022-12-14 NOTE — Telephone Encounter (Signed)
Immunization record has been updated

## 2022-12-14 NOTE — Telephone Encounter (Signed)
Patient brought in a copy of her immunization records to her provider. She just wants them in her file. They are in the colorful folder.

## 2022-12-28 ENCOUNTER — Ambulatory Visit: Payer: Medicare HMO | Admitting: Internal Medicine

## 2022-12-29 ENCOUNTER — Encounter: Payer: Self-pay | Admitting: Internal Medicine

## 2022-12-29 ENCOUNTER — Ambulatory Visit (INDEPENDENT_AMBULATORY_CARE_PROVIDER_SITE_OTHER): Payer: Medicare HMO | Admitting: Internal Medicine

## 2022-12-29 VITALS — BP 128/82 | HR 67 | Temp 97.9°F | Ht 66.0 in | Wt 155.0 lb

## 2022-12-29 DIAGNOSIS — F330 Major depressive disorder, recurrent, mild: Secondary | ICD-10-CM

## 2022-12-29 DIAGNOSIS — N182 Chronic kidney disease, stage 2 (mild): Secondary | ICD-10-CM

## 2022-12-29 DIAGNOSIS — F4321 Adjustment disorder with depressed mood: Secondary | ICD-10-CM | POA: Diagnosis not present

## 2022-12-29 DIAGNOSIS — F411 Generalized anxiety disorder: Secondary | ICD-10-CM | POA: Diagnosis not present

## 2022-12-29 MED ORDER — FAMOTIDINE 20 MG PO TABS: 20.0000 mg | ORAL_TABLET | Freq: Two times a day (BID) | ORAL | 1 refills | Status: DC

## 2022-12-29 MED ORDER — OLMESARTAN MEDOXOMIL 40 MG PO TABS: 40.0000 mg | ORAL_TABLET | Freq: Every day | ORAL | 3 refills | Status: DC

## 2022-12-29 MED ORDER — FLUOXETINE HCL 40 MG PO CAPS: 40.0000 mg | ORAL_CAPSULE | Freq: Every day | ORAL | 3 refills | Status: DC

## 2022-12-29 MED ORDER — EZETIMIBE 10 MG PO TABS: 10.0000 mg | ORAL_TABLET | Freq: Every day | ORAL | 3 refills | Status: DC

## 2022-12-29 MED ORDER — BUSPIRONE HCL 10 MG PO TABS: 10.0000 mg | ORAL_TABLET | Freq: Three times a day (TID) | ORAL | 0 refills | Status: DC

## 2022-12-29 NOTE — Assessment & Plan Note (Signed)
Changing from paxil to prozac per patient  request  after current refill

## 2022-12-29 NOTE — Progress Notes (Unsigned)
Subjective:  Patient ID: Grace [name], female    DOB: 1945-07-22  Age: 77 y.o. MRN: [id number]  CC: {There were no encounter diagnoses. (Refresh or delete this SmartLink)}   HPI Grace [name] presents for  Chief Complaint  Patient presents with   Medical Management of Chronic Issues    depression   Grief:  having more good days than bad days. Taking zyrte and relaxiium at bedtime paxil Paxil in the morning.   Uses 1/2 to  1 buspar daily . AFRADI OF BEING ALONE SOME DAYS . Has never lived by herlsef  children clseby    HLD: taking zetia in the morning   GERD:  managed with famotidine   HTN: stopped amlodipine  per nephrology readings have been 130/70 or less . Leg swelling has improved.    Recurrent cellulitis and ndules right lower leg .  Finally resolved with  abx.  EN workup negative    Outpatient Medications Prior to Visit  Medication Sig Dispense Refill   esomeprazole (NEXIUM) 20 MG capsule Take 20 mg by mouth daily at 12 noon.     levocetirizine (XYZAL) 5 MG tablet Take 5 mg by mouth every evening.     Multiple Vitamin (MULTIVITAMIN) tablet Take 1 tablet by mouth daily.     amLODipine (NORVASC) 2.5 MG tablet Take 2.5 mg by mouth every evening. (Patient not taking: Reported on 12/12/2022)     No facility-administered medications prior to visit.    Review of Systems;  Patient denies headache, fevers, malaise, unintentional weight loss, skin rash, eye pain, sinus congestion and sinus pain, sore throat, dysphagia,  hemoptysis , cough, dyspnea, wheezing, chest pain, palpitations, orthopnea, edema, abdominal pain, nausea, melena, diarrhea, constipation, flank pain, dysuria, hematuria, urinary  Frequency, nocturia, numbness, tingling, seizures,  Focal weakness, Loss of consciousness,  Tremor, insomnia, depression, anxiety, and suicidal ideation.      Objective:  BP 128/82   Pulse 67   Temp 97.9 F (36.6 C) (Oral)   Ht 5\' 6"  (1.676 m)   Wt 155 lb (70.3 kg)   SpO2  99%   BMI 25.02 kg/m   BP Readings from Last 3 Encounters:  12/29/22 128/82  11/14/22 (!) 147/91  11/08/22 110/86    Wt Readings from Last 3 Encounters:  12/29/22 155 lb (70.3 kg)  12/12/22 154 lb (69.9 kg)  11/14/22 149 lb 14.6 oz (68 kg)    Physical Exam  Lab Results  Component Value Date   HGBA1C 6.0 02/09/2022    Lab Results  Component Value Date   CREATININE 0.95 11/14/2022   CREATININE 0.96 09/18/2022   CREATININE 0.97 02/09/2022    Lab Results  Component Value Date   WBC 7.2 11/17/2022   HGB 12.4 11/17/2022   HCT 36.9 11/17/2022   PLT 237 11/17/2022   GLUCOSE 98 11/14/2022   CHOL 217 (H) 09/18/2022   TRIG 113.0 09/18/2022   HDL 59.10 09/18/2022   LDLDIRECT 127.0 09/18/2022   LDLCALC 136 (H) 09/18/2022   ALT 14 09/18/2022   AST 18 09/18/2022   NA 139 11/14/2022   K 3.9 11/14/2022   CL 105 11/14/2022   CREATININE 0.95 11/14/2022   BUN 17 11/14/2022   CO2 24 11/14/2022   TSH 1.92 02/09/2022   HGBA1C 6.0 02/09/2022   MICROALBUR 1.4 02/09/2022    US Venous Img Lower Unilateral Right  Result Date: 11/14/2022 CLINICAL DATA:  Right lower extremity pain and edema for the past 6 days. Evaluate for DVT.  EXAM: RIGHT LOWER EXTREMITY VENOUS DOPPLER ULTRASOUND TECHNIQUE: Gray-scale sonography with graded compression, as well as color Doppler and duplex ultrasound were performed to evaluate the lower extremity deep venous systems from the level of the common femoral vein and including the common femoral, femoral, profunda femoral, popliteal and calf veins including the posterior tibial, peroneal and gastrocnemius veins when visible. The superficial great saphenous vein was also interrogated. Spectral Doppler was utilized to evaluate flow at rest and with distal augmentation maneuvers in the common femoral, femoral and popliteal veins. COMPARISON:  Right lower extremity venous Doppler ultrasound-11/07/2022 (negative for DVT though did demonstrate a indeterminate fluid  collection at patient's palpable area of concern.). FINDINGS: Contralateral Common Femoral Vein: Respiratory phasicity is normal and symmetric with the symptomatic side. No evidence of thrombus. Normal compressibility. Common Femoral Vein: No evidence of thrombus. Normal compressibility, respiratory phasicity and response to augmentation. Saphenofemoral Junction: No evidence of thrombus. Normal compressibility and flow on color Doppler imaging. Profunda Femoral Vein: No evidence of thrombus. Normal compressibility and flow on color Doppler imaging. Femoral Vein: No evidence of thrombus. Normal compressibility, respiratory phasicity and response to augmentation. Popliteal Vein: No evidence of thrombus. Normal compressibility, respiratory phasicity and response to augmentation. Calf Veins: No evidence of thrombus. Normal compressibility and flow on color Doppler imaging. Superficial Great Saphenous Vein: No evidence of thrombus. Normal compressibility. Other Findings: Note is made of a 5.1 x 3.4 x 0.7 cm fluid collection with the right popliteal fossa compatible with a Baker's cyst. There is a moderate amount of subcutaneous edema at the patient's palpable area of concern involving the anterior aspect of the right shin without definable/drainable fluid collection. IMPRESSION: 1. No evidence of DVT the right lower extremity. 2. Incidentally noted 5.1 cm right-sided Baker's cyst. 3. Sonographic evaluation of patient's area of discomfort involving the anterior aspect of the right shin correlates with a moderate amount of subcutaneous edema without definable/drainable fluid collection Electronically Signed   By: Simonne Come M.D.   On: 11/14/2022 17:48    Assessment & Plan:  .There are no diagnoses linked to this encounter.   I provided 30 minutes of face-to-face time during this encounter reviewing patient's last visit with me, patient's  most recent visit with cardiology,  nephrology,  and neurology,  recent  surgical and non surgical procedures, previous  labs and imaging studies, counseling on currently addressed issues,  and post visit ordering to diagnostics and therapeutics .   Follow-up: No follow-ups on file.   Sherlene Shams, MD

## 2022-12-31 NOTE — Assessment & Plan Note (Addendum)
Herr mood disorder has been complicated by grief and anxiety triggered by the death of her husband.  She is managing her symptoms with buspirone and prozac

## 2022-12-31 NOTE — Assessment & Plan Note (Signed)
Continue olmesartan and annual follow up with [facility] .  She is avoiding NSAIDS  Lab Results  Component Value Date   CREATININE 0.95 11/14/2022     Lab Results  Component Value Date   LABMICR 20.4 08/02/2020   MICROALBUR 1.4 02/09/2022

## 2022-12-31 NOTE — Assessment & Plan Note (Signed)
Patient is dealing with the  loss of spouse and has adequate coping skills and emotional support . 

## 2023-01-24 ENCOUNTER — Encounter: Payer: Self-pay | Admitting: Internal Medicine

## 2023-01-24 NOTE — Telephone Encounter (Signed)
Noted  

## 2023-01-24 NOTE — Telephone Encounter (Signed)
Patient has been scheduled for a virtual visit with Dr. Duncan Dull on 01/26/2023.

## 2023-01-26 ENCOUNTER — Encounter: Payer: Self-pay | Admitting: Internal Medicine

## 2023-01-26 ENCOUNTER — Telehealth (INDEPENDENT_AMBULATORY_CARE_PROVIDER_SITE_OTHER): Payer: Medicare HMO | Admitting: Internal Medicine

## 2023-01-26 VITALS — Ht 66.0 in | Wt 148.0 lb

## 2023-01-26 DIAGNOSIS — F411 Generalized anxiety disorder: Secondary | ICD-10-CM

## 2023-01-26 DIAGNOSIS — F4321 Adjustment disorder with depressed mood: Secondary | ICD-10-CM

## 2023-01-26 DIAGNOSIS — F331 Major depressive disorder, recurrent, moderate: Secondary | ICD-10-CM | POA: Diagnosis not present

## 2023-01-26 MED ORDER — ARIPIPRAZOLE 2 MG PO TABS: 2.0000 mg | ORAL_TABLET | Freq: Every day | ORAL | 2 refills | Status: DC

## 2023-01-26 MED ORDER — FLUOXETINE HCL 20 MG PO CAPS: 20.0000 mg | ORAL_CAPSULE | Freq: Every day | ORAL | 3 refills | Status: DC

## 2023-01-26 NOTE — Progress Notes (Addendum)
Virtual Visit via Caregility   Note   This format is felt to be most appropriate for this patient at this time.  All issues noted in this document were discussed and addressed.  No physical exam was performed (except for noted visual exam findings with Video Visits).   I connected with Grace [name] on 01/26/23 at  5:00 PM EST by a video enabled telemedicine application  and verified that I am speaking with the correct person using two identifiers. Location patient: home Location provider: work or home office Persons participating in the virtual visit: patient, provider  I discussed the limitations, risks, security and privacy concerns of performing an evaluation and management service by telephone and the availability of in person appointments. I also discussed with the patient that there may be a patient responsible charge related to this service. The patient expressed understanding and agreed to proceed.  Reason for visit: depression/anxiety  HPI:  Grace [name] is a 77 yr old female with a long history of GAD managed with prozac who recently lost her husband [name] to pulmonary fibrosis on October 13 2022.  Marland Kitchen  She has been receiving grief counselling for several weeks but reports increased feelings of depression alternating with uncontrolled anxiety.  She notes that her mood flips from depression to anxiety throughout out the day ,  She wakes up with anxiety but is bothered by a feeling of hopelessness.  She has tried adding buspirone to her current prozac dose but feels sedated even on 1/4 tablet.  She states she is not suicidal    ROS: See pertinent positives and negatives per HPI.  Past Medical History:  Diagnosis Date   Actinic keratosis    allergic rhinitis    Basal cell carcinoma    Areas of face   BCC (basal cell carcinoma of skin) 07/05/2021   L forearm, [facility]   Benign heart murmur    Depression    resolved   GERD (gastroesophageal reflux disease)    Hx of colonic polyps    Hyperlipidemia     Hypertension     Past Surgical History:  Procedure Laterality Date   ABDOMINAL HYSTERECTOMY     TOTAL ABDOMINAL HYSTERECTOMY W/ BILATERAL SALPINGOOPHORECTOMY  1991   excessive bleeding    Family History  Problem Relation Age of Onset   Mental illness Mother 34       alzheimers dementia   Cancer Father 84       prostate   Heart disease Father        pacemaker,  no AMI    SOCIAL HX:  reports that she has never smoked. She has never used smokeless tobacco. She reports that she does not drink alcohol and does not use drugs.    Current Outpatient Medications:    ARIPiprazole (ABILIFY) 2 MG tablet, Take 1 tablet (2 mg total) by mouth daily., Disp: 30 tablet, Rfl: 2   busPIRone (BUSPAR) 10 MG tablet, Take 1 tablet (10 mg total) by mouth 3 (three) times daily., Disp: 90 tablet, Rfl: 0   esomeprazole (NEXIUM) 20 MG capsule, Take 20 mg by mouth daily at 12 noon., Disp: , Rfl:    ezetimibe (ZETIA) 10 MG tablet, Take 1 tablet (10 mg total) by mouth daily., Disp: 90 tablet, Rfl: 3   famotidine (PEPCID) 20 MG tablet, Take 1 tablet (20 mg total) by mouth 2 (two) times daily., Disp: 180 tablet, Rfl: 1   FLUoxetine (PROZAC) 20 MG capsule, Take 1 capsule (20 mg total) by mouth  daily., Disp: 90 capsule, Rfl: 3   FLUoxetine (PROZAC) 40 MG capsule, Take 1 capsule (40 mg total) by mouth daily., Disp: 90 capsule, Rfl: 3   Multiple Vitamin (MULTIVITAMIN) tablet, Take 1 tablet by mouth daily., Disp: , Rfl:    olmesartan (BENICAR) 40 MG tablet, Take 1 tablet (40 mg total) by mouth daily., Disp: 90 tablet, Rfl: 3  EXAM:  VITALS per patient if applicable:  GENERAL: alert, oriented, appears well and in no acute distress  HEENT: atraumatic, conjunttiva clear, no obvious abnormalities on inspection of external nose and ears  NECK: normal movements of the head and neck  LUNGS: on inspection no signs of respiratory distress, breathing rate appears normal, no obvious gross SOB, gasping or wheezing  CV:  no obvious cyanosis  MS: moves all visible extremities without noticeable abnormality  PSYCH/NEURO: pleasant and cooperative, no obvious depression or anxiety, speech and thought processing grossly intact  ASSESSMENT AND PLAN: Moderate episode of recurrent major depressive disorder (HCC) Assessment & Plan: Herr mood disorder has been complicated by grief and anxiety triggered by the death of her husband.  She is managing her symptoms with buspirone and prozac    Orders: -     Ambulatory referral to Psychology  Grief Assessment & Plan: I believe her current symptoms of depression are actually unresolved  grief following the los of her husband tut she disagrees.  Adding abilify and referring for counselling.   Orders: -     Ambulatory referral to Psychology  Anxiety, generalized Assessment & Plan: Chronic , managed with prozac for years,  but recent escalation of symtomss concurrent with depression/grief .  Did not improve with sertraline or paxil.  Not tolerating buspar due to excessive sedation  will increase prozac.   Orders: -     Ambulatory referral to Psychology  Other orders -     FLUoxetine HCl; Take 1 capsule (20 mg total) by mouth daily.  Dispense: 90 capsule; Refill: 3 -     ARIPiprazole; Take 1 tablet (2 mg total) by mouth daily.  Dispense: 30 tablet; Refill: 2      I discussed the assessment and treatment plan with the patient. The patient was provided an opportunity to ask questions and all were answered. The patient agreed with the plan and demonstrated an understanding of the instructions.   The patient was advised to call back or seek an in-person evaluation if the symptoms worsen or if the condition fails to improve as anticipated.   I spent 25 minutes dedicated to the care of this patient on the date of this encounter to include pre-visit review of hermedical history,  Face-to-face time with the patient , and post visit ordering of testing and therapeutics.     Sherlene Shams, MD

## 2023-01-26 NOTE — Assessment & Plan Note (Signed)
Herr mood disorder has been complicated by grief and anxiety triggered by the death of her husband.  She is managing her symptoms with buspirone and prozac

## 2023-01-28 NOTE — Assessment & Plan Note (Signed)
Chronic , managed with prozac for years,  but recent escalation of symtomss concurrent with depression/grief .  Did not improve with sertraline or paxil.  Not tolerating buspar due to excessive sedation  will increase prozac.

## 2023-01-28 NOTE — Assessment & Plan Note (Addendum)
I believe her current symptoms of depression are actually unresolved  grief following the los of her husband tut she disagrees.  Adding abilify and referring for counselling.

## 2023-01-29 ENCOUNTER — Encounter: Payer: Self-pay | Admitting: Internal Medicine

## 2023-02-03 ENCOUNTER — Encounter: Payer: Self-pay | Admitting: Internal Medicine

## 2023-02-13 ENCOUNTER — Ambulatory Visit: Payer: Medicare HMO | Admitting: Psychology

## 2023-02-14 DIAGNOSIS — N1831 Chronic kidney disease, stage 3a: Secondary | ICD-10-CM | POA: Diagnosis not present

## 2023-02-14 LAB — BASIC METABOLIC PANEL
BUN: 14 (ref 4–21)
Creatinine: 0.9 (ref 0.5–1.1)
Potassium: 4 meq/L (ref 3.5–5.1)
Sodium: 140 (ref 137–147)

## 2023-02-14 LAB — MICROALBUMIN / CREATININE URINE RATIO: Microalb Creat Ratio: 20.3

## 2023-02-21 ENCOUNTER — Ambulatory Visit: Payer: Medicare HMO | Admitting: Psychology

## 2023-02-21 DIAGNOSIS — I10 Essential (primary) hypertension: Secondary | ICD-10-CM | POA: Diagnosis not present

## 2023-02-21 DIAGNOSIS — N182 Chronic kidney disease, stage 2 (mild): Secondary | ICD-10-CM | POA: Diagnosis not present

## 2023-03-09 ENCOUNTER — Other Ambulatory Visit: Payer: Self-pay | Admitting: Internal Medicine

## 2023-03-12 ENCOUNTER — Ambulatory Visit: Payer: Medicare HMO | Admitting: Dermatology

## 2023-03-12 ENCOUNTER — Encounter: Payer: Self-pay | Admitting: Dermatology

## 2023-03-12 DIAGNOSIS — Z85828 Personal history of other malignant neoplasm of skin: Secondary | ICD-10-CM

## 2023-03-12 DIAGNOSIS — L821 Other seborrheic keratosis: Secondary | ICD-10-CM

## 2023-03-12 NOTE — Progress Notes (Signed)
   Follow-Up Visit   Subjective  Grace [name] is a 77 y.o. female who presents for the following: spot at scalp, does not go away, present since at least October. No bleeding, soreness, patient does have itching at scalp. Uses Head & Shoulders. Patient with hx of BCC.   The patient has spots, moles and lesions to be evaluated, some may be new or changing and the patient may have concern these could be cancer.   The following portions of the chart were reviewed this encounter and updated as appropriate: medications, allergies, medical history  Review of Systems:  No other skin or systemic complaints except as noted in HPI or Assessment and Plan.  Objective  Well appearing patient in no apparent distress; mood and affect are within normal limits.   A focused examination was performed of the following areas: scalp  Relevant exam findings are noted in the Assessment and Plan.    Assessment & Plan   SEBORRHEIC KERATOSIS - Stuck-on, waxy, tan-brown papules and/or plaques on left frontal scalp - Benign-appearing - Discussed benign etiology and prognosis. - Observe - Call for any changes    Return for TBSE, with Dr. Roseanne Reno, as scheduled, Hx BCC.  Anise Salvo, RMA, am acting as scribe for Elie Goody, MD .   Documentation: I have reviewed the above documentation for accuracy and completeness, and I agree with the above.  Elie Goody, MD

## 2023-03-12 NOTE — Patient Instructions (Signed)

## 2023-04-04 ENCOUNTER — Ambulatory Visit: Payer: Medicare HMO | Admitting: Internal Medicine

## 2023-04-09 ENCOUNTER — Encounter: Payer: Self-pay | Admitting: Internal Medicine

## 2023-04-09 DIAGNOSIS — Z1231 Encounter for screening mammogram for malignant neoplasm of breast: Secondary | ICD-10-CM | POA: Diagnosis not present

## 2023-04-09 LAB — HM MAMMOGRAPHY

## 2023-04-25 ENCOUNTER — Telehealth (INDEPENDENT_AMBULATORY_CARE_PROVIDER_SITE_OTHER): Payer: Medicare HMO | Admitting: Internal Medicine

## 2023-04-25 ENCOUNTER — Encounter: Payer: Self-pay | Admitting: Internal Medicine

## 2023-04-25 VITALS — BP 113/68 | Ht 66.0 in | Wt 155.0 lb

## 2023-04-25 DIAGNOSIS — I890 Lymphedema, not elsewhere classified: Secondary | ICD-10-CM

## 2023-04-25 DIAGNOSIS — N182 Chronic kidney disease, stage 2 (mild): Secondary | ICD-10-CM | POA: Diagnosis not present

## 2023-04-25 DIAGNOSIS — I10 Essential (primary) hypertension: Secondary | ICD-10-CM | POA: Diagnosis not present

## 2023-04-25 DIAGNOSIS — F330 Major depressive disorder, recurrent, mild: Secondary | ICD-10-CM | POA: Diagnosis not present

## 2023-04-25 DIAGNOSIS — F411 Generalized anxiety disorder: Secondary | ICD-10-CM

## 2023-04-25 MED ORDER — ARIPIPRAZOLE 2 MG PO TABS: 2.0000 mg | ORAL_TABLET | Freq: Every day | ORAL | 2 refills | Status: DC

## 2023-04-25 MED ORDER — FAMOTIDINE 20 MG PO TABS: 20.0000 mg | ORAL_TABLET | Freq: Two times a day (BID) | ORAL | 1 refills | Status: DC

## 2023-04-25 MED ORDER — CEPHALEXIN 500 MG PO CAPS: 500.0000 mg | ORAL_CAPSULE | Freq: Four times a day (QID) | ORAL | 0 refills | Status: DC

## 2023-04-25 NOTE — Assessment & Plan Note (Signed)
Reviewed last follow up with [facility] Nephrology' GFR had IMPROVED  TO 82 ML/MIN.  Continue olmesartan and now bi annual follow up with [facility] .  She is avoiding NSAIDS  Lab Results  Component Value Date   CREATININE 0.95 11/14/2022     Lab Results  Component Value Date   LABMICR 20.4 08/02/2020   MICROALBUR 1.4 02/09/2022

## 2023-04-25 NOTE — Assessment & Plan Note (Signed)
Aggravated by grief. Responding to addition of 1 mg Abilify

## 2023-04-25 NOTE — Progress Notes (Signed)
Virtual Visit via Caregility   Note   This format is felt to be most appropriate for this patient at this time.  All issues noted in this document were discussed and addressed.  No physical exam was performed (except for noted visual exam findings with Video Visits).   I connected  Chris on  04/25/23 at  1:30 PM EST by a video enabled telemedicine application and verified that I am speaking with the correct person using two identifiers. Location patient: home Location provider: work or home office Persons participating in the virtual visit: patient, provider  I discussed the limitations, risks, security and privacy concerns of performing an evaluation and management service by telephone and the availability of in person appointments. I also discussed with the patient that there may be a patient responsible charge related to this service. The patient expressed understanding and agreed to proceed.  Reason for visit: grief following the loss of her husband   HPI:   Complicated  grief:  Grace [name] is a 78 yr old female with a history of GAD previously managed with Prozac who lost her husband [name] to pulmonary fibrosis in August 2024 .  She developed profound depression complicated by mood swings.   She attended grief counselling which did not seem to help much.Grace [name] was last seen in  mid November at which time Abilify was added to her regimen and Prozac dose was increased to 60 mg daily.  She was also referred to psychology .  She was not tolerating buspirone due to excessive sedation.   She states that her mood has improved on the combination of prozac and abilify.. she still has good days followed by bad days,  and has reduced the Abilify dose  1mg  due to excessive sedation.. she is attending church services and reading faith based self help books on  grief  after attending several grief sessions that she did not find as helpful   HTN/CKD:  she is taking olmesartan 40 mg with readings < 130/80 or  less .  [facility] has decreased her follow up to every 2 years. She has no proteinuria,  her BP was 113/68 during their last follow up in December.    ROS: See pertinent positives and negatives per HPI.  Past Medical History:  Diagnosis Date   Actinic keratosis    allergic rhinitis    Basal cell carcinoma    Areas of face   BCC (basal cell carcinoma of skin) 07/05/2021   L forearm, [facility]   Benign heart murmur    Depression    resolved   GERD (gastroesophageal reflux disease)    Hx of colonic polyps    Hyperlipidemia    Hypertension     Past Surgical History:  Procedure Laterality Date   ABDOMINAL HYSTERECTOMY     TOTAL ABDOMINAL HYSTERECTOMY W/ BILATERAL SALPINGOOPHORECTOMY  1991   excessive bleeding    Family History  Problem Relation Age of Onset   Mental illness Mother 10       alzheimers dementia   Cancer Father 12       prostate   Heart disease Father        pacemaker,  no AMI    SOCIAL HX:  reports that she has never smoked. She has never used smokeless tobacco. She reports that she does not drink alcohol and does not use drugs.    Current Outpatient Medications:    cephALEXin (KEFLEX) 500 MG capsule, Take 1 capsule (500 mg total)  by mouth 4 (four) times daily., Disp: 28 capsule, Rfl: 0   esomeprazole (NEXIUM) 20 MG capsule, Take 20 mg by mouth daily at 12 noon., Disp: , Rfl:    ezetimibe (ZETIA) 10 MG tablet, Take 1 tablet (10 mg total) by mouth daily., Disp: 90 tablet, Rfl: 3   FLUoxetine (PROZAC) 20 MG capsule, Take 1 capsule (20 mg total) by mouth daily., Disp: 90 capsule, Rfl: 3   FLUoxetine (PROZAC) 40 MG capsule, Take 1 capsule (40 mg total) by mouth daily., Disp: 90 capsule, Rfl: 3   Multiple Vitamin (MULTIVITAMIN) tablet, Take 1 tablet by mouth daily., Disp: , Rfl:    olmesartan (BENICAR) 40 MG tablet, Take 1 tablet (40 mg total) by mouth daily., Disp: 90 tablet, Rfl: 3   ARIPiprazole (ABILIFY) 2 MG tablet, Take 1 tablet (2 mg total) by mouth  daily., Disp: 30 tablet, Rfl: 2   famotidine (PEPCID) 20 MG tablet, Take 1 tablet (20 mg total) by mouth 2 (two) times daily., Disp: 180 tablet, Rfl: 1  EXAM:  VITALS per patient if applicable:  GENERAL: alert, oriented, appears well and in no acute distress  HEENT: atraumatic, conjunttiva clear, no obvious abnormalities on inspection of external nose and ears  NECK: normal movements of the head and neck  LUNGS: on inspection no signs of respiratory distress, breathing rate appears normal, no obvious gross SOB, gasping or wheezing  CV: no obvious cyanosis  MS: moves all visible extremities without noticeable abnormality  PSYCH/NEURO: pleasant and cooperative, no obvious depression or anxiety, speech and thought processing grossly intact  ASSESSMENT AND PLAN: Anxiety, generalized Assessment & Plan: Chronic , managed with prozac for years,  but recent escalation of symptoms  concurrent with depression/grief .  Did not improve with sertraline or paxil.  Not tolerating buspar due to excessive sedation  continue 60 mg prozac    Chronic kidney disease (CKD) stage G2/A2, mildly decreased glomerular filtration rate (GFR) between 60-89 mL/min/1.73 square meter and albuminuria creatinine ratio between 30-299 mg/g Assessment & Plan: Reviewed last follow up with [facility]' GFR had IMPROVED  TO 82 ML/MIN.  Continue olmesartan and now bi annual follow up with [facility] .  She is avoiding NSAIDS  Lab Results  Component Value Date   CREATININE 0.95 11/14/2022     Lab Results  Component Value Date   LABMICR 20.4 08/02/2020   MICROALBUR 1.4 02/09/2022        Mild episode of recurrent major depressive disorder (HCC) Assessment & Plan: Aggravated by grief. Responding to addition of 1 mg Abilify   Lymphedema Assessment & Plan: With recurrent episodes of cellulitis. She is not using compression stockings due to inconvenience.  continue prn lasix (ok per nephrology).  Keflex  refilled for future use    Primary hypertension Assessment & Plan: Well controlled on olmesartan 40 mg daily . Amlodipine was stopped due to recurrent hypotension.  Renal function stable, no changes today.   Lab Results  Component Value Date   NA 140 02/14/2023   K 4.0 02/14/2023   CL 105 11/14/2022   CO2 24 11/14/2022   Lab Results  Component Value Date   CREATININE 0.9 02/14/2023      Other orders -     ARIPiprazole; Take 1 tablet (2 mg total) by mouth daily.  Dispense: 30 tablet; Refill: 2 -     Famotidine; Take 1 tablet (20 mg total) by mouth 2 (two) times daily.  Dispense: 180 tablet; Refill: 1 -  Cephalexin; Take 1 capsule (500 mg total) by mouth 4 (four) times daily.  Dispense: 28 capsule; Refill: 0      I discussed the assessment and treatment plan with the patient. The patient was provided an opportunity to ask questions and all were answered. The patient agreed with the plan and demonstrated an understanding of the instructions.   The patient was advised to call back or seek an in-person evaluation if the symptoms worsen or if the condition fails to improve as anticipated.   I spent 30 minutes dedicated to the care of this patient on the date of this encounter to include pre-visit review of her medical history, recent follow up with [facility] nephrology,  counselling  regarding grief management,  Face-to-face time with the patient , and post visit ordering of testing and therapeutics.    Sherlene Shams, MD

## 2023-04-25 NOTE — Assessment & Plan Note (Signed)
Chronic , managed with prozac for years,  but recent escalation of symptoms  concurrent with depression/grief .  Did not improve with sertraline or paxil.  Not tolerating buspar due to excessive sedation  continue 60 mg prozac

## 2023-04-25 NOTE — Assessment & Plan Note (Signed)
With recurrent episodes of cellulitis. She is not using compression stockings due to inconvenience.  continue prn lasix (ok per nephrology).  Keflex refilled for future use

## 2023-04-25 NOTE — Patient Instructions (Signed)
You might want to try using Relaxium for insomnia  (as seen on TV commercials) . It is available through [employer] and contains all natural supplements:  Melatonin 5 mg  Chamomile 25 mg Passionflower extract 75 mg GABA 100 mg Ashwaganda extract 125 mg Magnesium citrate, glycinate, oxide (100 mg)  L tryptophan 500 mg Valerest (proprietary  ingredient ; probably valeria root extract)

## 2023-04-25 NOTE — Assessment & Plan Note (Signed)
Well controlled on olmesartan 40 mg daily . Amlodipine was stopped due to recurrent hypotension.  Renal function stable, no changes today.   Lab Results  Component Value Date   NA 140 02/14/2023   K 4.0 02/14/2023   CL 105 11/14/2022   CO2 24 11/14/2022   Lab Results  Component Value Date   CREATININE 0.9 02/14/2023

## 2023-04-26 ENCOUNTER — Encounter: Payer: Self-pay | Admitting: Internal Medicine

## 2023-04-26 DIAGNOSIS — Z961 Presence of intraocular lens: Secondary | ICD-10-CM | POA: Diagnosis not present

## 2023-04-26 DIAGNOSIS — H524 Presbyopia: Secondary | ICD-10-CM | POA: Diagnosis not present

## 2023-04-26 DIAGNOSIS — H26493 Other secondary cataract, bilateral: Secondary | ICD-10-CM | POA: Diagnosis not present

## 2023-04-27 NOTE — Telephone Encounter (Signed)
Spoke with pt and scheduled her for an appointment in July.

## 2023-05-15 ENCOUNTER — Encounter: Payer: Self-pay | Admitting: Internal Medicine

## 2023-05-16 ENCOUNTER — Other Ambulatory Visit: Payer: Self-pay | Admitting: Internal Medicine

## 2023-05-16 MED ORDER — FUROSEMIDE 20 MG PO TABS: 20.0000 mg | ORAL_TABLET | Freq: Every day | ORAL | 0 refills | Status: AC

## 2023-05-31 ENCOUNTER — Encounter: Payer: Self-pay | Admitting: Internal Medicine

## 2023-06-05 ENCOUNTER — Telehealth (INDEPENDENT_AMBULATORY_CARE_PROVIDER_SITE_OTHER): Admitting: Internal Medicine

## 2023-06-05 ENCOUNTER — Encounter: Payer: Self-pay | Admitting: Internal Medicine

## 2023-06-05 VITALS — Ht 66.0 in | Wt 150.0 lb

## 2023-06-05 DIAGNOSIS — F331 Major depressive disorder, recurrent, moderate: Secondary | ICD-10-CM | POA: Diagnosis not present

## 2023-06-05 MED ORDER — BUPROPION HCL ER (SR) 100 MG PO TB12: 100.0000 mg | ORAL_TABLET | Freq: Every day | ORAL | 1 refills | Status: DC

## 2023-06-05 NOTE — Progress Notes (Signed)
 Virtual Visit via Caregility   Note   This format is felt to be most appropriate for this patient at this time.  All issues noted in this document were discussed and addressed.  No physical exam was performed (except for noted visual exam findings with Video Visits).   I connected with Grace [name]  on 06/05/23 at  5:00 PM EDT by a video enabled telemedicine application  and verified that I am speaking with the correct person using two identifiers. Location patient: home Location provider: work or home office Persons participating in the virtual visit: patient, provider  I discussed the limitations, risks, security and privacy concerns of performing an evaluation and management service by telephone and the availability of in person appointments. I also discussed with the patient that there may be a patient responsible charge related to this service. The patient expressed understanding and agreed to proceed.   Reason for visit: depression/complicated grief   HPI:   Grace [name] is a 78 yr old female with a history of GAD who is currently under treatment for complicated grief/depression triggered by the death of her husband [name] last fall.  She was feeling less depressed with addition of Abilify to her chronic ongoing prozac , but gained 10 lbs due to increased appetite,  so she  weaned herself off of  the Abilify ; however since stopping it her depressive symptoms have returned. She endorses anhedonia,   lack of hope, social withdrawal, lack of  motivation,  and frequent crying.  She is not suicidal. She is eating 3 times daily,  going to bed at 10 om and waking up at 5:00 am. Sleeping better with natural product called Relaxium  Has a routine of making coffee,  feeding the cat,  and then watches TV most of the day.   No alcohol , tobacco or illicits   ROS: See pertinent positives and negatives per HPI.  Past Medical History:  Diagnosis Date   Actinic keratosis    allergic rhinitis    Basal cell carcinoma     Areas of face   BCC (basal cell carcinoma of skin) 07/05/2021   L forearm, [facility]   Benign heart murmur    Depression    resolved   GERD (gastroesophageal reflux disease)    Hx of colonic polyps    Hyperlipidemia    Hypertension     Past Surgical History:  Procedure Laterality Date   ABDOMINAL HYSTERECTOMY     TOTAL ABDOMINAL HYSTERECTOMY W/ BILATERAL SALPINGOOPHORECTOMY  1991   excessive bleeding    Family History  Problem Relation Age of Onset   Mental illness Mother 46       alzheimers dementia   Cancer Father 24       prostate   Heart disease Father        pacemaker,  no AMI    SOCIAL HX: widowed,  lives alone with cat.    Current Outpatient Medications:    buPROPion ER (WELLBUTRIN SR) 100 MG 12 hr tablet, Take 1 tablet (100 mg total) by mouth daily., Disp: 90 tablet, Rfl: 1   esomeprazole (NEXIUM) 20 MG capsule, Take 20 mg by mouth daily at 12 noon., Disp: , Rfl:    ezetimibe (ZETIA) 10 MG tablet, Take 1 tablet (10 mg total) by mouth daily., Disp: 90 tablet, Rfl: 3   famotidine (PEPCID) 20 MG tablet, Take 1 tablet (20 mg total) by mouth 2 (two) times daily., Disp: 180 tablet, Rfl: 1   FLUoxetine (PROZAC) 20  MG capsule, Take 1 capsule (20 mg total) by mouth daily., Disp: 90 capsule, Rfl: 3   FLUoxetine (PROZAC) 40 MG capsule, Take 1 capsule (40 mg total) by mouth daily., Disp: 90 capsule, Rfl: 3   furosemide (LASIX) 20 MG tablet, Take 1 tablet (20 mg total) by mouth daily. As needed for fluid retention, Disp: 30 tablet, Rfl: 0   Multiple Vitamin (MULTIVITAMIN) tablet, Take 1 tablet by mouth daily., Disp: , Rfl:    olmesartan (BENICAR) 40 MG tablet, Take 1 tablet (40 mg total) by mouth daily., Disp: 90 tablet, Rfl: 3   ARIPiprazole (ABILIFY) 2 MG tablet, Take 1 tablet (2 mg total) by mouth daily. (Patient not taking: Reported on 06/05/2023), Disp: 30 tablet, Rfl: 2  EXAM:  VITALS per patient if applicable:  GENERAL: alert, oriented, appears grief stricken   HEENT:  atraumatic, conjunttiva clear, no obvious abnormalities on inspection of external nose and ears  NECK: normal movements of the head and neck  LUNGS: on inspection no signs of respiratory distress, breathing rate appears normal, no obvious gross SOB, gasping or wheezing  CV: no obvious cyanosis  MS: moves all visible extremities without noticeable abnormality  PSYCH/NEURO: pleasant and cooperative, no obvious depression or anxiety, speech and thought processing grossly intact  ASSESSMENT AND PLAN: Moderate episode of recurrent major depressive disorder (HCC) Assessment & Plan: Aggravated by grief.   Symptoms were controlled with Abilify, but the drug caused excessive weight gain and overeating. Symptoms have returned.  Recommending trial f welbutrin ST 100 mg once daily.  Encouraged to read psalms (particularly  61-63) for 15 minutes in the morning before turning on the TV . Follow up one week    Other orders -     buPROPion HCl ER (SR); Take 1 tablet (100 mg total) by mouth daily.  Dispense: 90 tablet; Refill: 1      I discussed the assessment and treatment plan with the patient. The patient was provided an opportunity to ask questions and all were answered. The patient agreed with the plan and demonstrated an understanding of the instructions.   The patient was advised to call back or seek an in-person evaluation if the symptoms worsen or if the condition fails to improve as anticipated.   I spent 30 minutes dedicated to the care of this patient on the date of this encounter to include pre-visit review of his medical history,  Face-to-face time with the patient , and post visit ordering of testing and therapeutics.    Sherlene Shams, MD

## 2023-06-05 NOTE — Progress Notes (Deleted)
 Telephone Visit Note   This format is felt to be most appropriate for this patient at this time.  All issues noted in this document were discussed and addressed.  No physical exam was performed (except for noted visual exam findings with Video Visits).   I attempted to connect  with Grace [name]  on 06/05/23 at  5:00 PM EDT by a video enabled telemedicine application  and verified that I am speaking with the correct person using two identifiers. Location patient: home Location provider: work or home office Persons participating in the virtual visit: patient, provider  I discussed the limitations, risks, security and privacy concerns of performing an evaluation and management service by telephone and the availability of in person appointments. I also discussed with the patient that there may be a patient responsible charge related to this service. The patient expressed understanding and agreed to proceed.  after several minutes,  interactive audio and video telecommunications failed and we were forced to complete visit with audio only.   Reason for visit: anxiety/depression  HPI:   78 yr old female currently under treatment for GAD/depression aggravated by grief following the death of her husband Grace [name] last fall.  She was feeling less depressed with addition of Abilfy, but was overeating constantly so she  weaned herself off of it ,  however since stopping it her depressive symptoms have returned. Anhdeonia, no motivation,  no joy    ROS: See pertinent positives and negatives per HPI.  Past Medical History:  Diagnosis Date   Actinic keratosis    allergic rhinitis    Basal cell carcinoma    Areas of face   BCC (basal cell carcinoma of skin) 07/05/2021   L forearm, [facility]   Benign heart murmur    Depression    resolved   GERD (gastroesophageal reflux disease)    Hx of colonic polyps    Hyperlipidemia    Hypertension     Past Surgical History:  Procedure Laterality Date   ABDOMINAL  HYSTERECTOMY     TOTAL ABDOMINAL HYSTERECTOMY W/ BILATERAL SALPINGOOPHORECTOMY  1991   excessive bleeding    Family History  Problem Relation Age of Onset   Mental illness Mother 77       alzheimers dementia   Cancer Father 47       prostate   Heart disease Father        pacemaker,  no AMI    SOCIAL HX: ***   Current Outpatient Medications:    ARIPiprazole (ABILIFY) 2 MG tablet, Take 1 tablet (2 mg total) by mouth daily., Disp: 30 tablet, Rfl: 2   cephALEXin (KEFLEX) 500 MG capsule, Take 1 capsule (500 mg total) by mouth 4 (four) times daily., Disp: 28 capsule, Rfl: 0   esomeprazole (NEXIUM) 20 MG capsule, Take 20 mg by mouth daily at 12 noon., Disp: , Rfl:    ezetimibe (ZETIA) 10 MG tablet, Take 1 tablet (10 mg total) by mouth daily., Disp: 90 tablet, Rfl: 3   famotidine (PEPCID) 20 MG tablet, Take 1 tablet (20 mg total) by mouth 2 (two) times daily., Disp: 180 tablet, Rfl: 1   FLUoxetine (PROZAC) 20 MG capsule, Take 1 capsule (20 mg total) by mouth daily., Disp: 90 capsule, Rfl: 3   FLUoxetine (PROZAC) 40 MG capsule, Take 1 capsule (40 mg total) by mouth daily., Disp: 90 capsule, Rfl: 3   furosemide (LASIX) 20 MG tablet, Take 1 tablet (20 mg total) by mouth daily. As needed for fluid retention, Disp:  30 tablet, Rfl: 0   Multiple Vitamin (MULTIVITAMIN) tablet, Take 1 tablet by mouth daily., Disp: , Rfl:    olmesartan (BENICAR) 40 MG tablet, Take 1 tablet (40 mg total) by mouth daily., Disp: 90 tablet, Rfl: 3  EXAM:  VITALS per patient if applicable:  General appearance: alert, cooperative and articulate.  No signs of being in distress  Lungs: not short of breath ,  No cough, speaking in full sentences  Psych: affect normal,dspeech is articulate and non pressured .  Denies suicidal thoughts     ASSESSMENT AND PLAN: There are no diagnoses linked to this encounter.    I discussed the assessment and treatment plan with the patient. The patient was provided an opportunity to ask  questions and all were answered. The patient agreed with the plan and demonstrated an understanding of the instructions.   The patient was advised to call back or seek an in-person evaluation if the symptoms worsen or if the condition fails to improve as anticipated.   I spent 22  minutes dedicated to the care of this patient on the date of this telephone encounter to include pre-visit review of his medical history,  non  Face-to-face time with the patient , and post visit ordering of testing and therapeutics.    Sherlene Shams, MD

## 2023-06-05 NOTE — Assessment & Plan Note (Addendum)
 Aggravated by grief.   Symptoms were controlled with Abilify, but the drug caused excessive weight gain and overeating. Symptoms have returned.  Recommending trial f welbutrin ST 100 mg once daily.  Encouraged to read psalms (particularly  61-63) for 15 minutes in the morning before turning on the TV . Follow up one week

## 2023-06-05 NOTE — Patient Instructions (Signed)
 I have sent Wellbutrin 100 mg dose to [facility].  Take it once daily in the morning with your other medications  You can add Buspar at night if needed to help you sleep  Psalms 61-63  have been particularly helpful  to me

## 2023-06-12 ENCOUNTER — Encounter: Payer: Self-pay | Admitting: Internal Medicine

## 2023-06-12 DIAGNOSIS — F330 Major depressive disorder, recurrent, mild: Secondary | ICD-10-CM

## 2023-06-12 MED ORDER — ARIPIPRAZOLE 2 MG PO TABS: 2.0000 mg | ORAL_TABLET | Freq: Every day | ORAL | 2 refills | Status: DC

## 2023-06-12 NOTE — Assessment & Plan Note (Signed)
 Aggravated by grief.   Symptoms were controlled with Abilify, but the drug caused excessive weight gain and overeating. Symptoms have returned.  Tolerating  welbutrin ST 100 mg once daily with fewer mood swings but wanting to add back abilify   adding abilify  2 mg dose

## 2023-06-13 ENCOUNTER — Encounter: Payer: Self-pay | Admitting: Internal Medicine

## 2023-06-21 ENCOUNTER — Telehealth: Payer: Self-pay | Admitting: Internal Medicine

## 2023-06-21 NOTE — Telephone Encounter (Signed)
 Copy of pt vaccine record would be for volunteer purposes. Pt will pick up when it is ready, can contact via cell phone. [facility]

## 2023-06-21 NOTE — Telephone Encounter (Signed)
 Pt had flu shot this a.m. at [facility], would like a copy of her vaccination list. [facility]

## 2023-06-21 NOTE — Telephone Encounter (Signed)
 Flu shot has been documented. Immunization record has been printed off and placed up front for pt to pick up.   Please let pt know when she calls back.

## 2023-06-26 ENCOUNTER — Encounter: Payer: Self-pay | Admitting: Internal Medicine

## 2023-07-11 ENCOUNTER — Encounter: Payer: Self-pay | Admitting: Podiatry

## 2023-07-11 ENCOUNTER — Ambulatory Visit: Admitting: Podiatry

## 2023-07-11 DIAGNOSIS — M7752 Other enthesopathy of left foot: Secondary | ICD-10-CM

## 2023-07-11 DIAGNOSIS — D2372 Other benign neoplasm of skin of left lower limb, including hip: Secondary | ICD-10-CM | POA: Diagnosis not present

## 2023-07-11 DIAGNOSIS — D2371 Other benign neoplasm of skin of right lower limb, including hip: Secondary | ICD-10-CM

## 2023-07-11 NOTE — Progress Notes (Signed)
 Subjective:  Patient ID: Grace [name], female    DOB: 09-03-1945,  MRN: [id number] HPI Chief Complaint  Patient presents with   Toe Pain    2nd toe left - corn, rubbing against big toe causing pain, tried toe separator, but hurt worse due to pressure, multiple deformities and swelling    78 y.o. female presents with the above complaint.   ROS: Denies fever chills nausea vomiting muscle aches pains calf pain back pain chest pain shortness of breath.  Past Medical History:  Diagnosis Date   Actinic keratosis    allergic rhinitis    Basal cell carcinoma    Areas of face   BCC (basal cell carcinoma of skin) 07/05/2021   L forearm, [facility]   Benign heart murmur    Depression    resolved   GERD (gastroesophageal reflux disease)    Hx of colonic polyps    Hyperlipidemia    Hypertension    Past Surgical History:  Procedure Laterality Date   ABDOMINAL HYSTERECTOMY     TOTAL ABDOMINAL HYSTERECTOMY W/ BILATERAL SALPINGOOPHORECTOMY  1991   excessive bleeding    Current Outpatient Medications:    ARIPiprazole  (ABILIFY ) 2 MG tablet, Take 1 tablet (2 mg total) by mouth daily., Disp: 30 tablet, Rfl: 2   esomeprazole  (NEXIUM ) 20 MG capsule, Take 20 mg by mouth daily at 12 noon., Disp: , Rfl:    ezetimibe  (ZETIA ) 10 MG tablet, Take 1 tablet (10 mg total) by mouth daily., Disp: 90 tablet, Rfl: 3   famotidine  (PEPCID ) 20 MG tablet, Take 1 tablet (20 mg total) by mouth 2 (two) times daily., Disp: 180 tablet, Rfl: 1   FLUoxetine  (PROZAC ) 20 MG capsule, Take 1 capsule (20 mg total) by mouth daily., Disp: 90 capsule, Rfl: 3   FLUoxetine  (PROZAC ) 40 MG capsule, Take 1 capsule (40 mg total) by mouth daily., Disp: 90 capsule, Rfl: 3   furosemide  (LASIX ) 20 MG tablet, Take 1 tablet (20 mg total) by mouth daily. As needed for fluid retention, Disp: 30 tablet, Rfl: 0   Multiple Vitamin (MULTIVITAMIN) tablet, Take 1 tablet by mouth daily., Disp: , Rfl:    olmesartan  (BENICAR ) 40 MG tablet, Take 1  tablet (40 mg total) by mouth daily., Disp: 90 tablet, Rfl: 3  No Known Allergies Review of Systems Objective:  There were no vitals filed for this visit.  General: Well developed, nourished, in no acute distress, alert and oriented x3   Dermatological: Skin is warm, dry and supple bilateral. Nails x 10 are well maintained; remaining integument appears unremarkable at this time. There are no open sores, no preulcerative lesions, no rash or signs of infection present.  Vascular: Dorsalis Pedis artery and Posterior Tibial artery pedal pulses are 2/4 bilateral with immedate capillary fill time. Pedal hair growth present. No varicosities and no lower extremity edema present bilateral.   Neruologic: Grossly intact via light touch bilateral. Vibratory intact via tuning fork bilateral. Protective threshold with Semmes Wienstein monofilament intact to all pedal sites bilateral. Patellar and Achilles deep tendon reflexes 2+ bilateral. No Babinski or clonus noted bilateral.   Musculoskeletal: No gross boney pedal deformities bilateral. No pain, crepitus, or limitation noted with foot and ankle range of motion bilateral. Muscular strength 5/5 in all groups tested bilateral.  Gait: Unassisted, Nonantalgic.    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

## 2023-09-05 ENCOUNTER — Ambulatory Visit: Admitting: Podiatry

## 2023-09-05 ENCOUNTER — Encounter: Payer: Self-pay | Admitting: Podiatry

## 2023-09-05 DIAGNOSIS — D2372 Other benign neoplasm of skin of left lower limb, including hip: Secondary | ICD-10-CM | POA: Diagnosis not present

## 2023-09-05 NOTE — Progress Notes (Signed)
 She presents today chief complaint of a painful lesion plantar aspect forefoot left.  Objective: Vital signs are stable alert and oriented x 3.  Pulses are palpable.  Severe hammertoe deformity resulting in plantarflexion of the third metatarsal head of the left foot which is resulted in a benign skin neoplasm.  This is painful but does not demonstrate preulcerative tendency.  Assessment: Benign skin lesion plantar aspect forefoot left.  Plan: Debrided benign skin lesion follow-up with her as needed

## 2023-09-12 ENCOUNTER — Ambulatory Visit: Admitting: Internal Medicine

## 2023-09-24 ENCOUNTER — Encounter: Payer: Self-pay | Admitting: Internal Medicine

## 2023-09-24 ENCOUNTER — Ambulatory Visit: Payer: Self-pay | Admitting: Internal Medicine

## 2023-09-24 ENCOUNTER — Ambulatory Visit: Payer: Medicare HMO | Admitting: Internal Medicine

## 2023-09-24 ENCOUNTER — Ambulatory Visit (INDEPENDENT_AMBULATORY_CARE_PROVIDER_SITE_OTHER): Admitting: Internal Medicine

## 2023-09-24 VITALS — BP 128/84 | HR 70 | Ht 66.0 in | Wt 158.6 lb

## 2023-09-24 DIAGNOSIS — F4321 Adjustment disorder with depressed mood: Secondary | ICD-10-CM

## 2023-09-24 DIAGNOSIS — M2011 Hallux valgus (acquired), right foot: Secondary | ICD-10-CM

## 2023-09-24 DIAGNOSIS — R5383 Other fatigue: Secondary | ICD-10-CM | POA: Diagnosis not present

## 2023-09-24 DIAGNOSIS — I10 Essential (primary) hypertension: Secondary | ICD-10-CM

## 2023-09-24 DIAGNOSIS — E7800 Pure hypercholesterolemia, unspecified: Secondary | ICD-10-CM

## 2023-09-24 DIAGNOSIS — R7301 Impaired fasting glucose: Secondary | ICD-10-CM | POA: Diagnosis not present

## 2023-09-24 DIAGNOSIS — N182 Chronic kidney disease, stage 2 (mild): Secondary | ICD-10-CM

## 2023-09-24 DIAGNOSIS — F331 Major depressive disorder, recurrent, moderate: Secondary | ICD-10-CM | POA: Diagnosis not present

## 2023-09-24 DIAGNOSIS — I890 Lymphedema, not elsewhere classified: Secondary | ICD-10-CM

## 2023-09-24 DIAGNOSIS — M21611 Bunion of right foot: Secondary | ICD-10-CM

## 2023-09-24 LAB — CBC WITH DIFFERENTIAL/PLATELET
Basophils Absolute: 0 K/uL (ref 0.0–0.1)
Basophils Relative: 0.9 % (ref 0.0–3.0)
Eosinophils Absolute: 0.1 K/uL (ref 0.0–0.7)
Eosinophils Relative: 1.7 % (ref 0.0–5.0)
HCT: 38.9 % (ref 36.0–46.0)
Hemoglobin: 13.2 g/dL (ref 12.0–15.0)
Lymphocytes Relative: 31.1 % (ref 12.0–46.0)
Lymphs Abs: 1.8 K/uL (ref 0.7–4.0)
MCHC: 33.9 g/dL (ref 30.0–36.0)
MCV: 93.3 fl (ref 78.0–100.0)
Monocytes Absolute: 0.4 K/uL (ref 0.1–1.0)
Monocytes Relative: 6.4 % (ref 3.0–12.0)
Neutro Abs: 3.5 K/uL (ref 1.4–7.7)
Neutrophils Relative %: 59.9 % (ref 43.0–77.0)
Platelets: 225 K/uL (ref 150.0–400.0)
RBC: 4.17 Mil/uL (ref 3.87–5.11)
RDW: 13.5 % (ref 11.5–15.5)
WBC: 5.8 K/uL (ref 4.0–10.5)

## 2023-09-24 LAB — LIPID PANEL
Cholesterol: 213 mg/dL — ABNORMAL HIGH (ref 0–200)
HDL: 75.7 mg/dL (ref >39.00)
LDL Cholesterol: 114 mg/dL — ABNORMAL HIGH (ref 0–99)
NonHDL: 137.03
Total CHOL/HDL Ratio: 3
Triglycerides: 114 mg/dL (ref 0.0–149.0)
VLDL: 22.8 mg/dL (ref 0.0–40.0)

## 2023-09-24 LAB — HEMOGLOBIN A1C: Hgb A1c MFr Bld: 5.8 % (ref 4.6–6.5)

## 2023-09-24 LAB — COMPREHENSIVE METABOLIC PANEL WITH GFR
ALT: 13 U/L (ref 0–35)
AST: 14 U/L (ref 0–37)
Albumin: 4.4 g/dL (ref 3.5–5.2)
Alkaline Phosphatase: 44 U/L (ref 39–117)
BUN: 18 mg/dL (ref 6–23)
CO2: 29 meq/L (ref 19–32)
Calcium: 9.4 mg/dL (ref 8.4–10.5)
Chloride: 103 meq/L (ref 96–112)
Creatinine, Ser: 1.06 mg/dL (ref 0.40–1.20)
GFR: 51.06 mL/min — ABNORMAL LOW (ref >60.00)
Glucose, Bld: 98 mg/dL (ref 70–99)
Potassium: 4.3 meq/L (ref 3.5–5.1)
Sodium: 139 meq/L (ref 135–145)
Total Bilirubin: 0.6 mg/dL (ref 0.2–1.2)
Total Protein: 6.6 g/dL (ref 6.0–8.3)

## 2023-09-24 LAB — TSH: TSH: 1.65 u[IU]/mL (ref 0.35–5.50)

## 2023-09-24 LAB — MICROALBUMIN / CREATININE URINE RATIO
Creatinine,U: 91.3 mg/dL
Microalb Creat Ratio: UNDETERMINED mg/g (ref 0.0–30.0)
Microalb, Ur: <0.7 mg/dL

## 2023-09-24 LAB — LDL CHOLESTEROL, DIRECT: Direct LDL: 100 mg/dL

## 2023-09-24 MED ORDER — OLMESARTAN MEDOXOMIL 40 MG PO TABS: 40.0000 mg | ORAL_TABLET | Freq: Every day | ORAL | 3 refills | Status: AC

## 2023-09-24 MED ORDER — FAMOTIDINE 20 MG PO TABS: 20.0000 mg | ORAL_TABLET | Freq: Two times a day (BID) | ORAL | 1 refills | Status: DC

## 2023-09-24 MED ORDER — ARIPIPRAZOLE 2 MG PO TABS: 2.0000 mg | ORAL_TABLET | Freq: Every day | ORAL | 5 refills | Status: DC

## 2023-09-24 NOTE — Progress Notes (Signed)
 Subjective:  Patient ID: Grace [name], female    DOB: March 29, 1945  Age: 78 y.o. MRN: [id number]  CC: The primary encounter diagnosis was Primary hypertension. 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   HPI Grace [name] presents for  Chief Complaint  Patient presents with   Medical Management of Chronic Issues    1) Depression:  last seen in April at which time she request to resume abilify  as additive therapy to Wellbutrin  due to persistent depressive symptoms on Wellbutrin  alone.  She states that in general her mood is better and she feels more stable.  Still has bad days and is dreading the anniversary of  Tommy's death in 2 weeks.  She is walking  daily for exercise .Sleeping well on zyrtec and ashwaganda.  Weight is stable.    2) Foot pain:  has seen podiatry . Several debridements done on plantar surface of left foot.  Told that recurrence was likely due to her foot deformity  3) Lymphedema:  no formal diagnosis.  Unable to wear compression socks due to difficulty getting them off   Outpatient Medications Prior to Visit  Medication Sig Dispense Refill   buPROPion  ER (WELLBUTRIN  SR) 100 MG 12 hr tablet Take 100 mg by mouth daily.     esomeprazole  (NEXIUM ) 20 MG capsule Take 20 mg by mouth daily at 12 noon.     ezetimibe  (ZETIA ) 10 MG tablet Take 1 tablet (10 mg total) by mouth daily. 90 tablet 3   FLUoxetine  (PROZAC ) 20 MG capsule Take 1 capsule (20 mg total) by mouth daily. 90 capsule 3   FLUoxetine  (PROZAC ) 40 MG capsule Take 1 capsule (40 mg total) by mouth daily. 90 capsule 3   furosemide   (LASIX ) 20 MG tablet Take 1 tablet (20 mg total) by mouth daily. As needed for fluid retention 30 tablet 0   Multiple Vitamin (MULTIVITAMIN) tablet Take 1 tablet by mouth daily.     ARIPiprazole  (ABILIFY ) 2 MG tablet Take 1 tablet (2 mg total) by mouth daily. 30 tablet 2   famotidine  (PEPCID ) 20 MG tablet Take 1 tablet (20 mg total) by mouth 2 (two) times daily. 180 tablet 1   olmesartan  (BENICAR ) 40 MG tablet Take 1 tablet (40 mg total) by mouth daily. 90 tablet 3   No facility-administered medications prior to visit.    Review of Systems;  Patient denies headache, fevers, malaise, unintentional weight loss, skin rash, eye pain, sinus congestion and sinus pain, sore throat, dysphagia,  hemoptysis , cough, dyspnea, wheezing, chest pain, palpitations, orthopnea, edema, abdominal pain, nausea, melena, diarrhea, constipation, flank pain, dysuria, hematuria, urinary  Frequency, nocturia, numbness, tingling, seizures,  Focal weakness, Loss of consciousness,  Tremor, insomnia, , anxiety, and suicidal ideation.      Objective:  BP 128/84   Pulse 70   Ht 5' 6 (1.676 m)   Wt 158 lb 9.6 oz (71.9 kg)   SpO2 96%   BMI 25.60 kg/m   BP Readings from Last 3 Encounters:  09/24/23 128/84  04/25/23 113/68  12/29/22 128/82    Wt Readings from Last 3 Encounters:  09/24/23 158 lb 9.6 oz (71.9 kg)  06/05/23 150 lb (68 kg)  04/25/23 155 lb (70.3 kg)    Physical Exam Vitals reviewed.  Constitutional:      General: She is not in acute distress.    Appearance: Normal appearance. She is normal weight. She is not ill-appearing, toxic-appearing or diaphoretic.  HENT:     Head: Normocephalic.  Eyes:     General: No scleral icterus.       Right eye: No discharge.        Left eye: No discharge.     Conjunctiva/sclera: Conjunctivae normal.  Cardiovascular:     Rate and Rhythm: Normal rate and regular rhythm.     Heart sounds: Normal heart sounds.  Pulmonary:     Effort: Pulmonary effort is  normal. No respiratory distress.     Breath sounds: Normal breath sounds.  Musculoskeletal:        General: Swelling present. Normal range of motion.  Skin:    General: Skin is warm and dry.     Coloration: Skin is pale.  Neurological:     General: No focal deficit present.     Mental Status: She is alert and oriented to person, place, and time. Mental status is at baseline.  Psychiatric:        Mood and Affect: Mood normal.        Behavior: Behavior normal.        Thought Content: Thought content normal.        Judgment: Judgment normal.     Lab Results  Component Value Date   HGBA1C 6.0 02/09/2022    Lab Results  Component Value Date   CREATININE 0.9 02/14/2023   CREATININE 0.95 11/14/2022   CREATININE 0.96 09/18/2022    Lab Results  Component Value Date   WBC 7.2 11/17/2022   HGB 12.4 11/17/2022   HCT 36.9 11/17/2022   PLT 237 11/17/2022   GLUCOSE 98 11/14/2022   CHOL 217 (H) 09/18/2022   TRIG 113.0 09/18/2022   HDL 59.10 09/18/2022   LDLDIRECT 127.0 09/18/2022   LDLCALC 136 (H) 09/18/2022   ALT 14 09/18/2022   AST 18 09/18/2022   NA 140 02/14/2023   K 4.0 02/14/2023   CL 105 11/14/2022   CREATININE 0.9 02/14/2023   BUN 14 02/14/2023   CO2 24 11/14/2022   TSH 1.92 02/09/2022   HGBA1C 6.0 02/09/2022    US  Venous Img Lower Unilateral Right Result Date: 11/14/2022 CLINICAL DATA:  Right lower extremity pain and edema for the past 6 days. Evaluate for DVT. EXAM: RIGHT LOWER EXTREMITY VENOUS DOPPLER ULTRASOUND TECHNIQUE: Gray-scale sonography with graded compression, as well as color Doppler and duplex ultrasound were performed to evaluate the lower extremity deep venous systems from the level of the common femoral vein and including the common femoral, femoral, profunda femoral, popliteal and calf veins including the posterior tibial, peroneal and gastrocnemius veins when visible. The superficial great saphenous vein was also interrogated. Spectral Doppler was  utilized to evaluate flow at rest and with distal augmentation maneuvers in the common femoral, femoral and popliteal veins. COMPARISON:  Right lower extremity venous Doppler ultrasound-11/07/2022 (negative for DVT though did demonstrate a indeterminate fluid collection at patient's palpable area of concern.). FINDINGS: Contralateral Common Femoral Vein: Respiratory phasicity is normal and symmetric with the symptomatic side. No evidence of thrombus. Normal compressibility. Common Femoral Vein: No evidence of thrombus. Normal compressibility, respiratory phasicity and response to augmentation. Saphenofemoral Junction: No evidence of thrombus. Normal compressibility and flow on color Doppler imaging. Profunda Femoral Vein: No evidence of  thrombus. Normal compressibility and flow on color Doppler imaging. Femoral Vein: No evidence of thrombus. Normal compressibility, respiratory phasicity and response to augmentation. Popliteal Vein: No evidence of thrombus. Normal compressibility, respiratory phasicity and response to augmentation. Calf Veins: No evidence of thrombus. Normal compressibility and flow on color Doppler imaging. Superficial Great Saphenous Vein: No evidence of thrombus. Normal compressibility. Other Findings: Note is made of a 5.1 x 3.4 x 0.7 cm fluid collection with the right popliteal fossa compatible with a Baker's cyst. There is a moderate amount of subcutaneous edema at the patient's palpable area of concern involving the anterior aspect of the right shin without definable/drainable fluid collection. IMPRESSION: 1. No evidence of DVT the right lower extremity. 2. Incidentally noted 5.1 cm right-sided Baker's cyst. 3. Sonographic evaluation of patient's area of discomfort involving the anterior aspect of the right shin correlates with a moderate amount of subcutaneous edema without definable/drainable fluid collection Electronically Signed   By: Norleen Roulette M.D.   On: 11/14/2022 17:48     Assessment & Plan:  .Primary hypertension Assessment & Plan: Well controlled on current dose of olmesartan .  Has orthostatic symptoms with sudden rising.  No  changes today.   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 This is painful but does not demonstrate preulcerative conditions.  Continue follow up with podiatry    Lymphedema -     Ambulatory referral to Vascular Surgery  Chronic kidney disease (CKD) stage G2/A2, mildly decreased glomerular filtration rate (GFR) between 60-89 mL/min/1.73 square meter and albuminuria creatinine ratio between 30-299 mg/g Assessment & Plan: Seeing nephrology annually.  Avoiding NSAIDS. Checking urine today for protein.  Taking max dose olmesartan     Grief Assessment & Plan: Complicating her depression . Improved     Moderate episode of recurrent major depressive disorder (HCC) Assessment & Plan: Aggravated by grief.   Symptoms are better controlled since resuming Abilify  2 mg to wellbutrin  .  No changes today    Other orders -     Famotidine ; Take 1 tablet (20 mg total) by mouth 2 (two) times daily.  Dispense: 180 tablet; Refill: 1 -     ARIPiprazole ; Take 1 tablet (2 mg total) by mouth daily.  Dispense: 30 tablet; Refill: 5 -     Olmesartan  Medoxomil; Take 1 tablet (40 mg total) by mouth daily.  Dispense: 90 tablet; Refill: 3     I spent 34 minutes on the day of this face to face encounter reviewing patient's  most recent visit  with podiatry and nrephrology,   prior relevant surgical and non surgical procedures, recent  labs and imaging studies, counseling on,  grief  management,  reviewing the assessment and plan with patient, and post visit ordering and reviewing of  diagnostics and therapeutics with patient  .   Follow-up: No follow-ups on file.   Verneita LITTIE Kettering, MD

## 2023-09-24 NOTE — Patient Instructions (Signed)
 Good to see you!  I'm glad you are feeling better  No medication changes were made today  A referral to [facility] was made to start managing your lymphedema

## 2023-09-24 NOTE — Assessment & Plan Note (Signed)
 Aggravated by grief.   Symptoms are better controlled since resuming Abilify  2 mg to wellbutrin  .  No changes today

## 2023-09-24 NOTE — Assessment & Plan Note (Signed)
 She has a severe hammertoe deformity resulting in plantarflexion of the third metatarsal head of the left foot which is resulted in a benign skin neoplasm. This is painful but does not demonstrate preulcerative conditions.  Continue follow up with podiatry

## 2023-09-24 NOTE — Assessment & Plan Note (Signed)
 Complicating her depression . Improved

## 2023-09-24 NOTE — Assessment & Plan Note (Signed)
 Seeing nephrology annually.  Avoiding NSAIDS. Checking urine today for protein.  Taking max dose olmesartan 

## 2023-09-24 NOTE — Assessment & Plan Note (Signed)
 Well controlled on current dose of olmesartan .  Has orthostatic symptoms with sudden rising.  No  changes today.

## 2023-09-26 ENCOUNTER — Ambulatory Visit: Admitting: Podiatry

## 2023-10-06 ENCOUNTER — Encounter: Payer: Self-pay | Admitting: Internal Medicine

## 2023-10-30 ENCOUNTER — Ambulatory Visit: Payer: Medicare HMO | Admitting: Dermatology

## 2023-10-30 DIAGNOSIS — Z1283 Encounter for screening for malignant neoplasm of skin: Secondary | ICD-10-CM

## 2023-10-30 DIAGNOSIS — W908XXA Exposure to other nonionizing radiation, initial encounter: Secondary | ICD-10-CM

## 2023-10-30 DIAGNOSIS — D1801 Hemangioma of skin and subcutaneous tissue: Secondary | ICD-10-CM

## 2023-10-30 DIAGNOSIS — L918 Other hypertrophic disorders of the skin: Secondary | ICD-10-CM | POA: Diagnosis not present

## 2023-10-30 DIAGNOSIS — D2371 Other benign neoplasm of skin of right lower limb, including hip: Secondary | ICD-10-CM

## 2023-10-30 DIAGNOSIS — L578 Other skin changes due to chronic exposure to nonionizing radiation: Secondary | ICD-10-CM | POA: Diagnosis not present

## 2023-10-30 DIAGNOSIS — C44310 Basal cell carcinoma of skin of unspecified parts of face: Secondary | ICD-10-CM | POA: Diagnosis not present

## 2023-10-30 DIAGNOSIS — D229 Melanocytic nevi, unspecified: Secondary | ICD-10-CM

## 2023-10-30 DIAGNOSIS — L820 Inflamed seborrheic keratosis: Secondary | ICD-10-CM | POA: Diagnosis not present

## 2023-10-30 DIAGNOSIS — D489 Neoplasm of uncertain behavior, unspecified: Secondary | ICD-10-CM

## 2023-10-30 DIAGNOSIS — L814 Other melanin hyperpigmentation: Secondary | ICD-10-CM

## 2023-10-30 DIAGNOSIS — L821 Other seborrheic keratosis: Secondary | ICD-10-CM

## 2023-10-30 DIAGNOSIS — D692 Other nonthrombocytopenic purpura: Secondary | ICD-10-CM

## 2023-10-30 DIAGNOSIS — D239 Other benign neoplasm of skin, unspecified: Secondary | ICD-10-CM

## 2023-10-30 DIAGNOSIS — C4491 Basal cell carcinoma of skin, unspecified: Secondary | ICD-10-CM

## 2023-10-30 DIAGNOSIS — C4441 Basal cell carcinoma of skin of scalp and neck: Secondary | ICD-10-CM | POA: Diagnosis not present

## 2023-10-30 DIAGNOSIS — Z85828 Personal history of other malignant neoplasm of skin: Secondary | ICD-10-CM

## 2023-10-30 HISTORY — DX: Basal cell carcinoma of skin, unspecified: C44.91

## 2023-10-30 NOTE — Patient Instructions (Addendum)

## 2023-10-30 NOTE — Progress Notes (Signed)
 Follow-Up Visit   Subjective  Grace [name] is a 78 y.o. female who presents for the following: Skin Cancer Screening and Full Body Skin Exam Hx of bcc, hx of isks  Spot behind right leg, spots at elbows b/l  get irritated, picks at    The patient presents for Total-Body Skin Exam (TBSE) for skin cancer screening and mole check. The patient has spots, moles and lesions to be evaluated, some may be new or changing and the patient may have concern these could be cancer.    The following portions of the chart were reviewed this encounter and updated as appropriate: medications, allergies, medical history  Review of Systems:  No other skin or systemic complaints except as noted in HPI or Assessment and Plan.  Objective  Well appearing patient in no apparent distress; mood and affect are within normal limits.  A full examination was performed including scalp, head, eyes, ears, nose, lips, neck, chest, axillae, abdomen, back, buttocks, bilateral upper extremities, bilateral lower extremities, hands, feet, fingers, toes, fingernails, and toenails. All findings within normal limits unless otherwise noted below.   Relevant physical exam findings are noted in the Assessment and Plan.  left elbow x 1, right elbow x 1, right popliteal x 2, left forearm x 1 (5) Erythematous stuck-on, waxy papule left upper temporal hairline 1.2 cm pink pearly macule    Assessment & Plan   SKIN CANCER SCREENING PERFORMED TODAY.  ACTINIC DAMAGE - Chronic condition, secondary to cumulative UV/sun exposure - diffuse scaly erythematous macules with underlying dyspigmentation - Recommend daily broad spectrum sunscreen SPF 30+ to sun-exposed areas, reapply every 2 hours as needed.  - Staying in the shade or wearing long sleeves, sun glasses (UVA+UVB protection) and wide brim hats (4-inch brim around the entire circumference of the hat) are also recommended for sun protection.  - Call for new or changing  lesions.  LENTIGINES, SEBORRHEIC KERATOSES, HEMANGIOMAS - Benign normal skin lesions - Benign-appearing - Call for any changes  MELANOCYTIC NEVI - Vertex scalp- 4.13mm firm flesh papule  - Tan-brown and/or pink-flesh-colored symmetric macules and papules - Benign appearing on exam today - Observation - Call clinic for new or changing moles - Recommend daily use of broad spectrum spf 30+ sunscreen to sun-exposed areas.   Purpura - Chronic; persistent and recurrent.  Treatable, but not curable. At arms - Violaceous macules and patches - Benign - Related to trauma, age, sun damage and/or use of blood thinners, chronic use of topical and/or oral steroids - Observe - Can use OTC arnica containing moisturizer such as Dermend Bruise Formula if desired - Call for worsening or other concerns   Varicose Veins/Spider Veins - Dilated blue, purple or red veins at the lower extremities - Reassured - Smaller vessels can be treated by sclerotherapy (a procedure to inject a medicine into the veins to make them disappear) if desired, but the treatment is not covered by insurance. Larger vessels may be covered if symptomatic and we would refer to vascular surgeon if treatment desired.  Acrochordons (Skin Tags) neck - Fleshy, skin-colored pedunculated papules - Benign appearing.  - Observe. - If desired, they can be removed with an in office procedure that is not covered by insurance. - Please call the clinic if you notice any new or changing lesions.  DERMATOFIBROMA Exam: Firm pink/brown papulenodule with dimple sign at right lower knee x 2 Treatment Plan: A dermatofibroma is a benign growth possibly related to trauma, such as an insect bite,  cut from shaving, or inflamed acne-type bump.  Treatment options to remove include shave or excision with resulting scar and risk of recurrence.  Since benign-appearing and not bothersome, will observe for now.    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  - Call if any new or changing lesions are noted between office visits  INFLAMED SEBORRHEIC KERATOSIS (5) left elbow x 1, right elbow x 1, right popliteal x 2, left forearm x 1 (5) Symptomatic, irritating, patient would like treated. Destruction of lesion - left elbow x 1, right elbow x 1, right popliteal x 2, left forearm x 1 (5)  Destruction method: cryotherapy   Informed consent: discussed and consent obtained   Lesion destroyed using liquid nitrogen: Yes   Region frozen until ice ball extended beyond lesion: Yes   Outcome: patient tolerated procedure well with no complications   Post-procedure details: wound care instructions given   Additional details:  Prior to procedure, discussed risks of blister formation, small wound, skin dyspigmentation, or rare scar following cryotherapy. Recommend Vaseline ointment to treated areas while healing.   NEOPLASM OF UNCERTAIN BEHAVIOR left upper temporal hairline Skin / nail biopsy Type of biopsy: tangential   Informed consent: discussed and consent obtained   Patient was prepped and draped in usual sterile fashion: Area prepped with alcohol. Anesthesia: the lesion was anesthetized in a standard fashion   Anesthetic:  1% lidocaine w/ epinephrine 1-100,000 buffered w/ 8.4% NaHCO3 Instrument used: flexible razor blade   Hemostasis achieved with: pressure, aluminum chloride and electrodesiccation   Outcome: patient tolerated procedure well   Post-procedure details: wound care instructions given   Post-procedure details comment:  Ointment and small bandage applied  Specimen 1 - Surgical pathology Differential Diagnosis: isk r/o bcc   Check Margins: No Isk r/o bcc Return in about 1 year (around 10/29/2024) for TBSE.  I,  Grace [name], CMA, am acting as scribe for [name], MD.   Documentation: I have reviewed the above documentation for accuracy and completeness, and I agree with the above.  [name], MD

## 2023-10-31 LAB — SURGICAL PATHOLOGY

## 2023-11-05 ENCOUNTER — Ambulatory Visit: Payer: Self-pay | Admitting: Dermatology

## 2023-11-05 ENCOUNTER — Encounter: Payer: Self-pay | Admitting: Dermatology

## 2023-11-05 DIAGNOSIS — C44319 Basal cell carcinoma of skin of other parts of face: Secondary | ICD-10-CM

## 2023-11-05 NOTE — Telephone Encounter (Signed)
 Patient advised of information per Dr. Jackquline. Patient has decided to be referred for [facility] surgery with Dr. Paci. Referral sent. aw

## 2023-11-07 ENCOUNTER — Encounter: Payer: Self-pay | Admitting: Internal Medicine

## 2023-11-13 ENCOUNTER — Telehealth: Payer: Self-pay

## 2023-11-13 NOTE — Telephone Encounter (Signed)
 Patient scheduled for mohs with Dr. Paci 12/03/23 @ 9:30./sh

## 2023-11-19 ENCOUNTER — Ambulatory Visit: Admitting: Dermatology

## 2023-11-19 DIAGNOSIS — C44319 Basal cell carcinoma of skin of other parts of face: Secondary | ICD-10-CM

## 2023-11-19 DIAGNOSIS — W5503XA Scratched by cat, initial encounter: Secondary | ICD-10-CM

## 2023-11-19 DIAGNOSIS — W908XXA Exposure to other nonionizing radiation, initial encounter: Secondary | ICD-10-CM | POA: Diagnosis not present

## 2023-11-19 DIAGNOSIS — S50812A Abrasion of left forearm, initial encounter: Secondary | ICD-10-CM | POA: Diagnosis not present

## 2023-11-19 DIAGNOSIS — L821 Other seborrheic keratosis: Secondary | ICD-10-CM

## 2023-11-19 DIAGNOSIS — L578 Other skin changes due to chronic exposure to nonionizing radiation: Secondary | ICD-10-CM | POA: Diagnosis not present

## 2023-11-19 MED ORDER — MUPIROCIN 2 % EX OINT: 1.0000 | TOPICAL_OINTMENT | Freq: Two times a day (BID) | CUTANEOUS | 0 refills | Status: AC

## 2023-11-19 NOTE — Progress Notes (Addendum)
   Follow-Up Visit   Subjective  Grace [name] is a 78 y.o. female who presents for the following: check spot L jaw, just noticed, no symptoms. Pt was scratched by her cat yesterday on arm   The following portions of the chart were reviewed this encounter and updated as appropriate: medications, allergies, medical history  Review of Systems:  No other skin or systemic complaints except as noted in HPI or Assessment and Plan.  Objective  Well appearing patient in no apparent distress; mood and affect are within normal limits.   A focused examination was performed of the following areas: face  Relevant exam findings are noted in the Assessment and Plan.    Assessment & Plan   BCC Bx proven L upper temporal hairline Exam: pink bx site  Treatment Plan: Pt scheduled with Dr. Paci for mohs 12/03/23  SEBORRHEIC KERATOSIS Face, neck - Stuck-on, waxy, tan-brown papules, including L jaw - Benign-appearing - Discussed benign etiology and prognosis. - Observe - Call for any changes  ACTINIC DAMAGE face - chronic, secondary to cumulative UV radiation exposure/sun exposure over time - diffuse scaly erythematous macules with underlying dyspigmentation - Recommend daily broad spectrum sunscreen SPF 30+ to sun-exposed areas, reapply every 2 hours as needed.  - Recommend staying in the shade or wearing long sleeves, sun glasses (UVA+UVB protection) and wide brim hats (4-inch brim around the entire circumference of the hat). - Call for new or changing lesions.   TRAUMATIC INJURY Secondary to cat scratch 11/18/23 Exam: purpura and excoriations L forearm  Treatment Plan: Start Mupirocin  oint bid to aa left arm until healed    Return for as scheduled for TBSE.  I, Grayce Saunas, RMA, am acting as scribe for [name], MD .   Documentation: I have reviewed the above documentation for accuracy and completeness, and I agree with the above.  [name], MD

## 2023-11-19 NOTE — Patient Instructions (Addendum)
 Seborrheic Keratosis  What causes seborrheic keratoses? Seborrheic keratoses are harmless, common skin growths that first appear during adult life.  As time goes by, more growths appear.  Some people may develop a large number of them.  Seborrheic keratoses appear on both covered and uncovered body parts.  They are not caused by sunlight.  The tendency to develop seborrheic keratoses can be inherited.  They vary in color from skin-colored to gray, brown, or even black.  They can be either smooth or have a rough, warty surface.   Seborrheic keratoses are superficial and look as if they were stuck on the skin.  Under the microscope this type of keratosis looks like layers upon layers of skin.  That is why at times the top layer may seem to fall off, but the rest of the growth remains and re-grows.    Treatment Seborrheic keratoses do not need to be treated, but can easily be removed in the office.  Seborrheic keratoses often cause symptoms when they rub on clothing or jewelry.  Lesions can be in the way of shaving.  If they become inflamed, they can cause itching, soreness, or burning.  Removal of a seborrheic keratosis can be accomplished by freezing, burning, or surgery. If any spot bleeds, scabs, or grows rapidly, please return to have it checked, as these can be an indication of a skin cancer.  Due to recent changes in healthcare laws, you may see results of your pathology and/or laboratory studies on MyChart before the doctors have had a chance to review them. We understand that in some cases there may be results that are confusing or concerning to you. Please understand that not all results are received at the same time and often the doctors may need to interpret multiple results in order to provide you with the best plan of care or course of treatment. Therefore, we ask that you please give us  2 business days to thoroughly review all your results before contacting the office for clarification. Should  we see a critical lab result, you will be contacted sooner.   If You Need Anything After Your Visit  If you have any questions or concerns for your doctor, please call our main line at [phone] and press option 4 to reach your doctor's medical assistant. If no one answers, please leave a voicemail as directed and we will return your call as soon as possible. Messages left after 4 pm will be answered the following business day.   You may also send us  a message via MyChart. We typically respond to MyChart messages within 1-2 business days.  For prescription refills, please ask your pharmacy to contact our office. Our fax number is [phone].  If you have an urgent issue when the clinic is closed that cannot wait until the next business day, you can page your doctor at the number below.    Please note that while we do our best to be available for urgent issues outside of office hours, we are not available 24/7.   If you have an urgent issue and are unable to reach us , you may choose to seek medical care at your doctor's office, retail clinic, urgent care center, or emergency room.  If you have a medical emergency, please immediately call 911 or go to the emergency department.  Pager Numbers  - Dr. Hester: [phone]  - Dr. Jackquline: [phone]  - Dr. Claudene: [phone]   - Dr. Raymund: [phone]  In the event of inclement weather,  please call our main line at [phone] for an update on the status of any delays or closures.  Dermatology Medication Tips: Please keep the boxes that topical medications come in in order to help keep track of the instructions about where and how to use these. Pharmacies typically print the medication instructions only on the boxes and not directly on the medication tubes.   If your medication is too expensive, please contact our office at [phone] option 4 or send us  a message through MyChart.   We are unable to tell what your co-pay  for medications will be in advance as this is different depending on your insurance coverage. However, we may be able to find a substitute medication at lower cost or fill out paperwork to get insurance to cover a needed medication.   If a prior authorization is required to get your medication covered by your insurance company, please allow us  1-2 business days to complete this process.  Drug prices often vary depending on where the prescription is filled and some pharmacies may offer cheaper prices.  The website www.goodrx.com contains coupons for medications through different pharmacies. The prices here do not account for what the cost may be with help from insurance (it may be cheaper with your insurance), but the website can give you the price if you did not use any insurance.  - You can print the associated coupon and take it with your prescription to the pharmacy.  - You may also stop by our office during regular business hours and pick up a GoodRx coupon card.  - If you need your prescription sent electronically to a different pharmacy, notify our office through [facility] or by phone at [phone] option 4.     Si Usted Necesita Algo Despus de Su Visita  Tambin puede enviarnos un mensaje a travs de Clinical cytogeneticist. Por lo general respondemos a los mensajes de MyChart en el transcurso de 1 a 2 das hbiles.  Para renovar recetas, por favor pida a su farmacia que se ponga en contacto con nuestra oficina. Randi lakes de fax es [place] [phone].  Si tiene un asunto urgente cuando la clnica est cerrada y que no puede esperar hasta el siguiente da hbil, puede llamar/localizar a su doctor(a) al nmero que aparece a continuacin.   Por favor, tenga en cuenta que aunque hacemos todo lo posible para estar disponibles para asuntos urgentes fuera del horario de [place], no estamos disponibles las 24 horas del da, los 7 [address]  [address] de la [place].   Si tiene un problema urgente y no puede  comunicarse con nosotros, puede optar por buscar atencin mdica  en el consultorio de su doctor(a), en una clnica privada, en un centro de atencin urgente o en una sala de emergencias.  Si tiene Engineer, drilling, por favor llame inmediatamente al 911 o vaya a la sala de emergencias.  Nmeros de bper  - Dr. Hester: [phone]  - Dra. Jackquline: [phone]  - Dr. Claudene: [phone]  - Dra. Kitts: [phone]  En caso de inclemencias del [place], por favor llame a nuestra lnea principal al [phone] para una actualizacin sobre el estado de cualquier retraso o cierre.  Consejos para la medicacin en dermatologa: Por favor, guarde las cajas en las que vienen los medicamentos de uso tpico para ayudarle a seguir las instrucciones sobre dnde y cmo usarlos. Las farmacias generalmente imprimen las instrucciones del medicamento slo en las cajas y no directamente en los tubos del [place].   Si  su medicamento es muy caro, por favor, pngase en contacto con landry rieger llamando al [phone] y presione la opcin 4 o envenos un mensaje a travs de Clinical cytogeneticist.   No podemos decirle cul ser su copago por los medicamentos por adelantado ya que esto es diferente dependiendo de la cobertura de su seguro. Sin embargo, es posible que podamos encontrar un medicamento sustituto a Audiological scientist un formulario para que el seguro cubra el medicamento que se considera necesario.   Si se requiere una autorizacin previa para que su compaa de seguros malta su medicamento, por favor permtanos de 1 a 2 das hbiles para completar este proceso.  Los precios de los medicamentos varan con frecuencia dependiendo del Environmental consultant de dnde se surte la receta y alguna farmacias pueden ofrecer precios ms baratos.  El sitio web www.goodrx.com tiene cupones para medicamentos de Health and safety inspector. Los precios aqu no tienen en cuenta lo que podra costar con la ayuda del seguro (puede ser ms  barato con su seguro), pero el sitio web puede darle el precio si no utiliz Tourist information centre manager.  - Puede imprimir el cupn correspondiente y llevarlo con su receta a la farmacia.  - Tambin puede pasar por nuestra oficina durante el horario de atencin regular y Education officer, museum una tarjeta de cupones de GoodRx.  - Si necesita que su receta se enve electrnicamente a una farmacia diferente, informe a nuestra oficina a travs de MyChart de  o por telfono llamando al [phone] y presione la opcin 4.

## 2023-11-20 ENCOUNTER — Ambulatory Visit (INDEPENDENT_AMBULATORY_CARE_PROVIDER_SITE_OTHER): Admitting: Nurse Practitioner

## 2023-11-20 ENCOUNTER — Encounter (INDEPENDENT_AMBULATORY_CARE_PROVIDER_SITE_OTHER): Payer: Self-pay | Admitting: Nurse Practitioner

## 2023-11-20 VITALS — BP 126/79 | HR 68 | Resp 18 | Wt 160.6 lb

## 2023-11-20 DIAGNOSIS — I890 Lymphedema, not elsewhere classified: Secondary | ICD-10-CM | POA: Diagnosis not present

## 2023-11-23 ENCOUNTER — Other Ambulatory Visit: Payer: Self-pay | Admitting: Internal Medicine

## 2023-11-23 NOTE — Telephone Encounter (Signed)
 Called Pt to clarify if she is taking this medication she states she isn't taking it anymore you can refuse this medication

## 2023-11-25 ENCOUNTER — Encounter (INDEPENDENT_AMBULATORY_CARE_PROVIDER_SITE_OTHER): Payer: Self-pay | Admitting: Nurse Practitioner

## 2023-11-25 NOTE — Progress Notes (Signed)
 Subjective:    Patient ID: Grace [name], female    DOB: Jan 29, 1946, 78 y.o.   MRN: [id number] Chief Complaint  Patient presents with   Follow-up    Ref [place] consult lymphedema     Kaliah Haddaway is a 78 year old female that is referred by her primary care physician Dr. Marylynn in regards to concern for lymphedema.  We last saw the patient approximately 3 years ago for a similar issue.  She notes that the swelling is not bothersome for her.  She denies any open wounds or ulcerations.  She denies any recurrent cellulitis.  She denies any blisters or weeping of the extremities.  She has tried to utilize medical grade compression with gauze and difficult to remove.  She does elevate her lower extremities.  She does note the swelling comes and goes but she does not have any significant difficulties with this.     Review of Systems  Cardiovascular:  Positive for leg swelling.  All other systems reviewed and are negative.      Objective:   Physical Exam Vitals reviewed.  HENT:     Head: Normocephalic.  Cardiovascular:     Rate and Rhythm: Normal rate.     Pulses: Normal pulses.  Pulmonary:     Effort: Pulmonary effort is normal.  Musculoskeletal:     Right lower leg: 1+ Edema present.     Left lower leg: 1+ Edema present.  Skin:    General: Skin is warm and dry.  Neurological:     Mental Status: She is alert and oriented to person, place, and time.  Psychiatric:        Mood and Affect: Mood normal.        Behavior: Behavior normal.        Thought Content: Thought content normal.        Judgment: Judgment normal.     BP 126/79   Pulse 68   Resp 18   Wt 160 lb 9.6 oz (72.8 kg)   BMI 25.92 kg/m   Past Medical History:  Diagnosis Date   Actinic keratosis    allergic rhinitis 1985   Anxiety 1993   Basal cell carcinoma    Areas of face   BCC (basal cell carcinoma of skin) 07/05/2021   L forearm, [facility]   BCC (basal cell carcinoma) 10/30/2023   Superfical, L upper  temproal hairline, referred to Dr. Corey for [facility]   Benign heart murmur    Cataract 2020   Chronic kidney disease 2020   Depression 1993   resolved   GERD (gastroesophageal reflux disease)    Hx of colonic polyps    Hyperlipidemia 2000   Hypertension 2000   Neuromuscular disorder (HCC) 2019    Social History   Socioeconomic History   Marital status: Widowed    Spouse name: Not on file   Number of children: Not on file   Years of education: Not on file   Highest education level: Bachelor's degree (e.g., BA, AB, BS)  Occupational History   Not on file  Tobacco Use   Smoking status: Never   Smokeless tobacco: Never  Substance and Sexual Activity   Alcohol use: No   Drug use: No   Sexual activity: Not on file  Other Topics Concern   Not on file  Social History Narrative   widow   Social Drivers of Health   Financial Resource Strain: Low Risk  (09/20/2023)   Overall Financial Resource Strain (CARDIA)  Difficulty of Paying Living Expenses: Not hard at all  Food Insecurity: No Food Insecurity (09/20/2023)   Hunger Vital Sign    Worried About Running Out of Food in the Last Year: Never true    Ran Out of Food in the Last Year: Never true  Transportation Needs: No Transportation Needs (09/20/2023)   PRAPARE - Administrator, Civil Service (Medical): No    Lack of Transportation (Non-Medical): No  Physical Activity: Insufficiently Active (09/20/2023)   Exercise Vital Sign    Days of Exercise per Week: 4 days    Minutes of Exercise per Session: 20 min  Stress: No Stress Concern Present (09/20/2023)   [employer] of Occupational Health - Occupational Stress Questionnaire    Feeling of Stress: Only a little  Social Connections: Moderately Isolated (09/20/2023)   Social Connection and Isolation Panel    Frequency of Communication with Friends and Family: More than three times a week    Frequency of Social Gatherings with Friends and Family: Twice a week     Attends Religious Services: More than 4 times per year    Active Member of [employer] or Organizations: No    Attends Banker Meetings: Not on file    Marital Status: Widowed  Intimate Partner Violence: Not At Risk (12/12/2022)   Humiliation, Afraid, Rape, and Kick questionnaire    Fear of Current or Ex-Partner: No    Emotionally Abused: No    Physically Abused: No    Sexually Abused: No    Past Surgical History:  Procedure Laterality Date   ABDOMINAL HYSTERECTOMY  1997   BREAST SURGERY  1997   Benign cyst   EYE SURGERY  2022   Cataracts   FRACTURE SURGERY  1995   Broken arm-pin inserted   TOTAL ABDOMINAL HYSTERECTOMY W/ BILATERAL SALPINGOOPHORECTOMY  03/13/1989   excessive bleeding    Family History  Problem Relation Age of Onset   Mental illness Mother 47       alzheimers dementia   Intellectual disability Mother    Cancer Father 32       prostate   Heart disease Father        pacemaker,  no AMI   Anxiety disorder Father    Depression Father    Hearing loss Father    Hyperlipidemia Father    Hypertension Father    Arthritis Maternal Grandmother    Vision loss Maternal Grandmother    Heart disease Paternal Grandmother     No Known Allergies     Latest Ref Rng & Units 09/24/2023    9:19 AM 11/17/2022    2:54 PM 11/14/2022    3:40 PM  CBC  WBC 4.0 - 10.5 K/uL 5.8  7.2  8.8   Hemoglobin 12.0 - 15.0 g/dL 86.7  87.5  87.0   Hematocrit 36.0 - 46.0 % 38.9  36.9  37.9   Platelets 150.0 - 400.0 K/uL 225.0  237  212       CMP     Component Value Date/Time   NA 139 09/24/2023 0919   NA 140 02/14/2023 0000   K 4.3 09/24/2023 0919   CL 103 09/24/2023 0919   CO2 29 09/24/2023 0919   GLUCOSE 98 09/24/2023 0919   BUN 18 09/24/2023 0919   BUN 14 02/14/2023 0000   CREATININE 1.06 09/24/2023 0919   CREATININE 0.84 03/27/2012 0802   CALCIUM 9.4 09/24/2023 0919   PROT 6.6 09/24/2023 0919   PROT 6.4 08/03/2021  0916   ALBUMIN 4.4 09/24/2023 0919   ALBUMIN 4.3  08/03/2021 0916   AST 14 09/24/2023 0919   ALT 13 09/24/2023 0919   ALKPHOS 44 09/24/2023 0919   BILITOT 0.6 09/24/2023 0919   BILITOT 0.4 08/03/2021 0916   GFR 51.06 (L) 09/24/2023 0919   EGFR 63 08/03/2021 0916   GFRNONAA >60 11/14/2022 1540   GFRNONAA 74 03/27/2012 0802     No results found.     Assessment & Plan:   1. Lymphedema (Primary) I had a discussion with the patient again regarding lymphedema and conservative therapies.  We discussed conservative therapies including use of medical grade compression socks, elevation and activity.  The patient does find compression socks difficult to take off but we also discussed options such as compression socks that have a zipper, which may be easier to utilize.  We also discussed a lymphedema pump.  However, the patient notes that her swelling is not causing her any significant issues and she only visited at the behest of her primary care physician.  As the patient does not feel that her symptoms are causing her any significant issues she does not wish to move forward with any more further testing or lymphedema pump.  Patient will again follow-up with us  on an as-needed basis.  Current Outpatient Medications on File Prior to Visit  Medication Sig Dispense Refill   ARIPiprazole  (ABILIFY ) 2 MG tablet Take 1 tablet (2 mg total) by mouth daily. 30 tablet 5   esomeprazole  (NEXIUM ) 20 MG capsule Take 20 mg by mouth daily at 12 noon.     ezetimibe  (ZETIA ) 10 MG tablet Take 1 tablet (10 mg total) by mouth daily. 90 tablet 3   famotidine  (PEPCID ) 20 MG tablet Take 1 tablet (20 mg total) by mouth 2 (two) times daily. 180 tablet 1   FLUoxetine  (PROZAC ) 20 MG capsule Take 1 capsule (20 mg total) by mouth daily. 90 capsule 3   FLUoxetine  (PROZAC ) 40 MG capsule Take 1 capsule (40 mg total) by mouth daily. 90 capsule 3   furosemide  (LASIX ) 20 MG tablet Take 1 tablet (20 mg total) by mouth daily. As needed for fluid retention 30 tablet 0   Multiple  Vitamin (MULTIVITAMIN) tablet Take 1 tablet by mouth daily.     mupirocin  ointment (BACTROBAN ) 2 % Apply 1 Application topically 2 (two) times daily. Bid to left arm 22 g 0   olmesartan  (BENICAR ) 40 MG tablet Take 1 tablet (40 mg total) by mouth daily. 90 tablet 3   No current facility-administered medications on file prior to visit.    There are no Patient Instructions on file for this visit. No follow-ups on file.   Mayola Mcbain E Somaly Marteney, NP

## 2023-11-28 ENCOUNTER — Encounter: Payer: Self-pay | Admitting: Dermatology

## 2023-12-03 ENCOUNTER — Other Ambulatory Visit: Payer: Medicare HMO

## 2023-12-03 ENCOUNTER — Encounter: Payer: Self-pay | Admitting: Dermatology

## 2023-12-03 ENCOUNTER — Ambulatory Visit: Admitting: Dermatology

## 2023-12-03 VITALS — BP 131/77 | HR 60 | Temp 98.5°F

## 2023-12-03 DIAGNOSIS — L578 Other skin changes due to chronic exposure to nonionizing radiation: Secondary | ICD-10-CM

## 2023-12-03 DIAGNOSIS — D485 Neoplasm of uncertain behavior of skin: Secondary | ICD-10-CM

## 2023-12-03 DIAGNOSIS — C44319 Basal cell carcinoma of skin of other parts of face: Secondary | ICD-10-CM | POA: Diagnosis not present

## 2023-12-03 DIAGNOSIS — D492 Neoplasm of unspecified behavior of bone, soft tissue, and skin: Secondary | ICD-10-CM

## 2023-12-03 DIAGNOSIS — L814 Other melanin hyperpigmentation: Secondary | ICD-10-CM | POA: Diagnosis not present

## 2023-12-03 DIAGNOSIS — C4491 Basal cell carcinoma of skin, unspecified: Secondary | ICD-10-CM

## 2023-12-03 NOTE — Progress Notes (Signed)
 Follow-Up Visit   Subjective  Grace [name] is a 78 y.o. female who presents for the following: Mohs of Superficial and Nodular Basal Cell Carcinoma of the left upper temporal hairline, referred by Dr. Jackquline.   She also mentions a lesion of concern on her left cheek, present for several months, tender and getting larger.   The following portions of the chart were reviewed this encounter and updated as appropriate: medications, allergies, medical history  Review of Systems:  No other skin or systemic complaints except as noted in HPI or Assessment and Plan.  Objective  Well appearing patient in no apparent distress; mood and affect are within normal limits.  A focused examination was performed of the following areas: Left upper temporal hairline Relevant physical exam findings are noted in the Assessment and Plan.   left upper temporal hairline Pink scaly plaque   Assessment & Plan   BASAL CELL CARCINOMA (BCC), UNSPECIFIED SITE left upper temporal hairline Mohs surgery  Consent obtained: written  Anticoagulation: Was the anticoagulation regimen changed prior to Mohs? No    Anesthesia: Anesthesia method: local infiltration Local anesthetic: lidocaine 1% WITH epi  Procedure Details: Timeout: pre-procedure verification complete Procedure Prep: patient was prepped and draped in usual sterile fashion Prep type: chlorhexidine Biopsy accession number: [id number] Pre-Op diagnosis: basal cell carcinoma BCC subtype: nodular and superficial MohsAIQ Surgical site (if tumor spans multiple areas, please select predominant area): forehead (non-eyebrow) Surgery side: left Surgical site (from skin exam): left upper temporal hairline Pre-operative length (cm): 1 Pre-operative width (cm): 0.8 Indications for Mohs surgery: anatomic location where tissue conservation is critical  Micrographic Surgery Details: Post-operative length (cm): 2.1 Post-operative width (cm):  2 Number of Mohs stages: 2 Post surgery depth of defect: dermis  Stage 1    Tumor features identified on Mohs section: basal carcinoma    Depth of tumor invasion after stage: dermis  Stage 2    Tumor features identified on Mohs section: no tumor identified  Reconstruction: Was the defect reconstructed? Yes   Was reconstruction performed by the same Mohs surgeon? Yes   Setting of reconstruction: outpatient office When was reconstruction performed? same day Type of reconstruction: linear Linear reconstruction: complex  Skin repair Complexity:  Complex Final length (cm):  5.1 Informed consent: discussed and consent obtained   Timeout: patient name, date of birth, surgical site, and procedure verified   Procedure prep:  Patient was prepped and draped in usual sterile fashion Prep type:  Chlorhexidine Anesthesia: the lesion was anesthetized in a standard fashion   Anesthetic:  1% lidocaine w/ epinephrine 1-100,000 buffered w/ 8.4% NaHCO3 Reason for type of repair: reduce tension to allow closure, preserve normal anatomy, preserve normal anatomical and functional relationships, avoid adjacent structures and allow side-to-side closure without requiring a flap or graft   Undermining: area extensively undermined   Subcutaneous layers (deep stitches):  Suture size:  5-0 Suture type: Monocryl (poliglecaprone 25)   Stitches:  Buried vertical mattress Fine/surface layer approximation (top stitches):  Suture size:  6-0 Suture type: fast-absorbing plain gut   Stitches: simple running   Hemostasis achieved with: suture, pressure and electrodesiccation Outcome: patient tolerated procedure well with no complications   Post-procedure details: sterile dressing applied and wound care instructions given   Dressing type: bandage and pressure dressing    NEOPLASM OF SKIN    Neoplasm of Skin- Left temple/cheek- Ddx- AK vs HAK vs SCCIS - Recommend following up with Dr. Jackquline for  evaluation  Return in  about 4 weeks (around 12/31/2023) for mohs follow up.  LILLETTE [name], CMA, am acting as scribe for RUFUS CHRISTELLA HOLY, MD.    12/03/2023  HISTORY OF PRESENT ILLNESS  Grace [name] is seen in consultation at the request of Dr. Jackquline for biopsy-proven Superficial and Nodular Basal Cell Carcinoma on the left frontal hairline. They note that the area has been present for about 6 months increasing in size with time.  There is no history of previous treatment.  Reports no other new or changing lesions and has no other complaints today.  Medications and allergies: see patient chart.  Review of systems: Reviewed 8 systems and notable for the above skin cancer.  All other systems reviewed are unremarkable/negative, unless noted in the HPI. Past medical history, surgical history, family history, social history were also reviewed and are noted in the chart/questionnaire.    PHYSICAL EXAMINATION  General: Well-appearing, in no acute distress, alert and oriented x 4. Vitals reviewed in chart (if available).   Skin: Exam reveals a 1.0 x 0.8 cm erythematous papule and biopsy scar on the left frontal hairline. There are rhytids, telangiectasias, and lentigines, consistent with photodamage.  Biopsy report(s) reviewed, confirming the diagnosis.   ASSESSMENT  1) Superficial and Nodular Basal Cell Carcinoma on the left frontal hairline 2) photodamage 3) solar lentigines   PLAN   1. Due to location, size, histology, or recurrence and the likelihood of subclinical extension as well as the need to conserve normal surrounding tissue, the patient was deemed acceptable for Mohs micrographic surgery (MMS).  The nature and purpose of the procedure, associated benefits and risks including recurrence and scarring, possible complications such as pain, infection, and bleeding, and alternative methods of treatment if appropriate were discussed with the patient during consent. The lesion  location was verified by the patient, by reviewing previous notes, pathology reports, and by photographs as well as angulation measurements if available.  Informed consent was reviewed and signed by the patient, and timeout was performed at 9:30 AM. See op note below.  2. For the photodamage and solar lentigines, sun protection discussed/information given on OTC sunscreens, and we recommend continued regular follow-up with primary dermatologist every 6 months or sooner for any growing, bleeding, or changing lesions. 3. Prognosis and future surveillance discussed. 4. Letter with treatment outcome sent to referring provider. 5. Pain acetaminophen/ibuprofen   MOHS MICROGRAPHIC SURGERY AND RECONSTRUCTION  Initial size:   1.0 x 0.8 cm Surgical defect/wound size: 2.1 x 2.0 cm Anesthesia:    0.33% lidocaine with 1:200,000 epinephrine EBL:    <5 mL Complications:  None Repair type:   Complex SQ suture:   5-0 Monocryl Cutaneous suture:  6-0 Plain gut Final size of the repair: 5.1 cm  Stages: 2  STAGE I: Anesthesia achieved with 0.5% lidocaine with 1:200,000 epinephrine. ChloraPrep applied. 1 section(s) excised using Mohs technique (this includes total peripheral and deep tissue margin excision and evaluation with frozen sections, excised and interpreted by the same physician). The tumor was first debulked and then excised with an approx. 2 mm margin.  Hemostasis was achieved with electrocautery as needed.  The specimen was then oriented, subdivided/relaxed, inked, and processed using Mohs technique.    Frozen section analysis revealed a positive margin for islands of cells with peripheral palisading and a haphazard arrangement of the more central cells in the peripheral margin.    STAGE II: An additional 2 mm margin was excised.  Hemostasis was achieved with electrocautery as needed.  The  specimen was then oriented, subdivided/relaxed, inked, and processed using Mohs technique. Evaluation of slides by  the Mohs surgeon revealed clear tumor margins.  Reconstruction  The surgical wound was then cleaned, prepped, and re-anesthetized as above. Wound edges were undermined extensively along at least one entire edge and at a distance equal to or greater than the width of the defect (see wound defect size above) in order to achieve closure and decrease wound tension and anatomic distortion. Redundant tissue repair including standing cone removal was performed. Hemostasis was achieved with electrocautery. Subcutaneous and epidermal tissues were approximated with the above sutures. The surgical site was then lightly scrubbed with sterile, saline-soaked gauze. The area was then bandaged using Vaseline ointment, non-adherent gauze, gauze pads, and tape to provide an adequate pressure dressing. The patient tolerated the procedure well, was given detailed written and verbal wound care instructions, and was discharged in good condition.   The patient will follow-up: 4 weeks.    Documentation: I have reviewed the above documentation for accuracy and completeness, and I agree with the above.  RUFUS CHRISTELLA HOLY, MD

## 2023-12-03 NOTE — Patient Instructions (Signed)

## 2023-12-06 ENCOUNTER — Encounter: Payer: Self-pay | Admitting: Dermatology

## 2023-12-06 ENCOUNTER — Ambulatory Visit: Admitting: Dermatology

## 2023-12-06 DIAGNOSIS — L821 Other seborrheic keratosis: Secondary | ICD-10-CM

## 2023-12-06 DIAGNOSIS — W908XXA Exposure to other nonionizing radiation, initial encounter: Secondary | ICD-10-CM

## 2023-12-06 DIAGNOSIS — L578 Other skin changes due to chronic exposure to nonionizing radiation: Secondary | ICD-10-CM

## 2023-12-06 DIAGNOSIS — Z85828 Personal history of other malignant neoplasm of skin: Secondary | ICD-10-CM | POA: Diagnosis not present

## 2023-12-06 DIAGNOSIS — L570 Actinic keratosis: Secondary | ICD-10-CM

## 2023-12-06 NOTE — Progress Notes (Signed)
 Follow-Up Visit   Subjective  Grace [name] is a 78 y.o. female who presents for the following: spot at left cheek. Patient had Mohs on Monday with Dr. Paci and she noticed a spot at left cheek she thought should be checked.   The following portions of the chart were reviewed this encounter and updated as appropriate: medications, allergies, medical history  Review of Systems:  No other skin or systemic complaints except as noted in HPI or Assessment and Plan.  Objective  Well appearing patient in no apparent distress; mood and affect are within normal limits.   A focused examination was performed of the following areas: face  Relevant exam findings are noted in the Assessment and Plan.  left lateral eye Erythematous thin papules/macules with gritty scale.    Assessment & Plan   SEBORRHEIC KERATOSIS - Stuck-on, waxy, tan-brown papules and/or plaques  - Benign-appearing - Discussed benign etiology and prognosis. - Observe - Call for any changes   AK (ACTINIC KERATOSIS) left lateral eye Actinic keratoses are precancerous spots that appear secondary to cumulative UV radiation exposure/sun exposure over time. They are chronic with expected duration over 1 year. A portion of actinic keratoses will progress to squamous cell carcinoma of the skin. It is not possible to reliably predict which spots will progress to skin cancer and so treatment is recommended to prevent development of skin cancer.  Recommend daily broad spectrum sunscreen SPF 30+ to sun-exposed areas, reapply every 2 hours as needed.  Recommend staying in the shade or wearing long sleeves, sun glasses (UVA+UVB protection) and wide brim hats (4-inch brim around the entire circumference of the hat). Call for new or changing lesions.  Area of concern looks improved since patient saw Dr. Corey and photograph was taken.  Area still has rough scale to touch so will treat as AK  Destruction of lesion - left lateral  eye  Destruction method: cryotherapy   Informed consent: discussed and consent obtained   Lesion destroyed using liquid nitrogen: Yes   Region frozen until ice ball extended beyond lesion: Yes   Outcome: patient tolerated procedure well with no complications   Post-procedure details: wound care instructions given   Additional details:  Prior to procedure, discussed risks of blister formation, small wound, skin dyspigmentation, or rare scar following cryotherapy. Recommend Vaseline ointment to treated areas while healing.    HISTORY OF BASAL CELL CARCINOMA OF THE SKIN - No evidence of recurrence today, healing well- L upper temporal hairline, [facility] 9/25 - Recommend regular full body skin exams - Recommend daily broad spectrum sunscreen SPF 30+ to sun-exposed areas, reapply every 2 hours as needed.  - Call if any new or changing lesions are noted between office visits   ACTINIC DAMAGE - chronic, secondary to cumulative UV radiation exposure/sun exposure over time - diffuse scaly erythematous macules with underlying dyspigmentation - Recommend daily broad spectrum sunscreen SPF 30+ to sun-exposed areas, reapply every 2 hours as needed.  - Recommend staying in the shade or wearing long sleeves, sun glasses (UVA+UVB protection) and wide brim hats (4-inch brim around the entire circumference of the hat). - Call for new or changing lesions.   Return in about 6 months (around 06/04/2024) for with Dr. Jackquline , [place] Exposed Areas.  LILLETTE Lonell Drones, RMA, am acting as scribe for Rexene Jackquline, MD .   Documentation: I have reviewed the above documentation for accuracy and completeness, and I agree with the above.  Rexene Jackquline, MD

## 2023-12-06 NOTE — Patient Instructions (Signed)

## 2023-12-14 ENCOUNTER — Encounter: Payer: Medicare HMO | Admitting: Internal Medicine

## 2024-01-01 ENCOUNTER — Ambulatory Visit (INDEPENDENT_AMBULATORY_CARE_PROVIDER_SITE_OTHER): Admitting: Dermatology

## 2024-01-01 ENCOUNTER — Encounter: Payer: Self-pay | Admitting: Dermatology

## 2024-01-01 VITALS — BP 131/77 | HR 58

## 2024-01-01 DIAGNOSIS — C44319 Basal cell carcinoma of skin of other parts of face: Secondary | ICD-10-CM

## 2024-01-01 DIAGNOSIS — L905 Scar conditions and fibrosis of skin: Secondary | ICD-10-CM | POA: Diagnosis not present

## 2024-01-01 DIAGNOSIS — Z85828 Personal history of other malignant neoplasm of skin: Secondary | ICD-10-CM

## 2024-01-01 NOTE — Progress Notes (Signed)
   Follow Up Visit   Subjective  Grace [name] is a 78 y.o. female who presents for the following: follow up from Mohs surgery   The patient presents for follow up from Mohs surgery for a BCC on the left upper temporal hairline, treated on 12/03/23, repaired with linear closure. The patient has been bandaging the wound as directed. The endorse the following concerns: No questions or concerns at this time.  The following portions of the chart were reviewed this encounter and updated as appropriate: medications, allergies, medical history  Review of Systems:  No other skin or systemic complaints except as noted in HPI or Assessment and Plan.  Objective  Well appearing patient in no apparent distress; mood and affect are within normal limits.  A focal examination was performed including scalp, head, and face. All findings within normal limits unless otherwise noted below.  Healing wound with mild erythema  Relevant physical exam findings are noted in the Assessment and Plan.    Assessment & Plan   Scar s/p Mohs for [facility], treated on 12/03/23, repaired with linear closure - Reassured that wound is healing well - No evidence of infection - No swelling, induration, purulence, dehiscence, or tenderness out of proportion to the clinical exam, see photo above - Discussed that scars take up to 12 months to mature from the date of surgery - Recommend SPF 30+ to scar daily to prevent purple color from UV exposure during scar maturation process - Discussed that erythema and raised appearance of scar will fade over the next 4-6 months - OK to start scar massage at 4-6 weeks post-op - Can consider silicone based products for scar healing starting at 6 weeks post-op - Ok to discontinue ointment daily to wound  HISTORY OF BASAL CELL CARCINOMA OF THE SKIN - No evidence of recurrence today - Recommend regular full body skin exams - Recommend daily broad spectrum sunscreen SPF 30+ to sun-exposed  areas, reapply every 2 hours as needed.  - Call if any new or changing lesions are noted between office visits   Return if symptoms worsen or fail to improve.  I, Berwyn Lesches, Surg Tech III, am acting as scribe for RUFUS CHRISTELLA HOLY, MD.   Documentation: I have reviewed the above documentation for accuracy and completeness, and I agree with the above.  RUFUS CHRISTELLA HOLY, MD

## 2024-01-01 NOTE — Patient Instructions (Signed)

## 2024-01-10 ENCOUNTER — Other Ambulatory Visit: Payer: Self-pay | Admitting: Internal Medicine

## 2024-01-21 ENCOUNTER — Other Ambulatory Visit: Payer: Self-pay | Admitting: Internal Medicine

## 2024-02-04 ENCOUNTER — Encounter: Payer: Self-pay | Admitting: Internal Medicine

## 2024-02-04 NOTE — Telephone Encounter (Signed)
 Copied from CRM #[id number]. Topic: Clinical - Medication Question >> Feb 04, 2024  2:46 PM Viola F wrote: Reason for CRM: Patient called to follow up on MyChart message that was sent today regarding the depression/anxiety medication - she wants to let Dr. Marylynn know that she responded via MyChart and is looking forward to a response back

## 2024-02-12 ENCOUNTER — Other Ambulatory Visit: Payer: Self-pay | Admitting: Internal Medicine

## 2024-02-25 ENCOUNTER — Ambulatory Visit: Admitting: Internal Medicine

## 2024-03-25 ENCOUNTER — Encounter: Payer: Self-pay | Admitting: Internal Medicine

## 2024-03-25 ENCOUNTER — Ambulatory Visit

## 2024-03-25 ENCOUNTER — Ambulatory Visit (INDEPENDENT_AMBULATORY_CARE_PROVIDER_SITE_OTHER): Admitting: Internal Medicine

## 2024-03-25 VITALS — BP 118/74 | HR 72 | Temp 98.5°F | Ht 65.0 in | Wt 159.4 lb

## 2024-03-25 DIAGNOSIS — N182 Chronic kidney disease, stage 2 (mild): Secondary | ICD-10-CM

## 2024-03-25 DIAGNOSIS — I10 Essential (primary) hypertension: Secondary | ICD-10-CM | POA: Diagnosis not present

## 2024-03-25 DIAGNOSIS — Z23 Encounter for immunization: Secondary | ICD-10-CM

## 2024-03-25 DIAGNOSIS — D649 Anemia, unspecified: Secondary | ICD-10-CM | POA: Diagnosis not present

## 2024-03-25 DIAGNOSIS — E7800 Pure hypercholesterolemia, unspecified: Secondary | ICD-10-CM

## 2024-03-25 DIAGNOSIS — F331 Major depressive disorder, recurrent, moderate: Secondary | ICD-10-CM | POA: Diagnosis not present

## 2024-03-25 DIAGNOSIS — Z1231 Encounter for screening mammogram for malignant neoplasm of breast: Secondary | ICD-10-CM

## 2024-03-25 MED ORDER — FAMOTIDINE 20 MG PO TABS: 20.0000 mg | ORAL_TABLET | Freq: Two times a day (BID) | ORAL | 1 refills | Status: AC

## 2024-03-25 MED ORDER — ARIPIPRAZOLE 2 MG PO TABS: 2.0000 mg | ORAL_TABLET | Freq: Every day | ORAL | 5 refills | Status: AC

## 2024-03-25 MED ORDER — FLUOXETINE HCL 40 MG PO CAPS: ORAL_CAPSULE | ORAL | 0 refills | Status: AC

## 2024-03-25 NOTE — Progress Notes (Unsigned)
 "  Subjective:  Patient ID: CARYL FATE, female    DOB: 01/08/1946  Age: 79 y.o. MRN: [id number]  CC: The primary encounter diagnosis was Encounter for screening mammogram for malignant neoplasm of breast. Diagnoses of Primary hypertension, Pure hypercholesterolemia, and Needs flu shot were also pertinent to this visit.   HPI ALFHILD PARTCH presents for  Chief Complaint  Patient presents with   Hypertension   Depression   Anxiety   Leg Pain   1) Depression with anxiety: chronic since childhood,  aggravated by tommy's death/ .   has episodes triggered by household repair issues previously handled by deceased husband Tommy . Feels helpless, bathroom flooded twice .  Has joined the chruch choir and handbell choir which helped fill up Christmas with uplifitng activities.   No prior psychiatry evaluation    did not benefit from talk therapy,     self medicated with tryptophan for years.    2)     Outpatient Medications Prior to Visit  Medication Sig Dispense Refill   ARIPiprazole  (ABILIFY ) 2 MG tablet Take 1 tablet (2 mg total) by mouth daily. 30 tablet 5   esomeprazole  (NEXIUM ) 20 MG capsule Take 20 mg by mouth daily at 12 noon.     ezetimibe  (ZETIA ) 10 MG tablet Take 1 tablet by mouth once daily 90 tablet 3   famotidine  (PEPCID ) 20 MG tablet Take 1 tablet (20 mg total) by mouth 2 (two) times daily. 180 tablet 1   FLUoxetine  (PROZAC ) 20 MG capsule TAKE 1 CAPSULE BY MOUTH ONCE DAILY ALONG WITH 40MG  FOR TOTAL DOSE OF 60MG  90 capsule 1   FLUoxetine  (PROZAC ) 40 MG capsule TAKE 1 CAPSULE BY MOUTH ONCE DAILY (DISCONTINUE  PAXIL ) 90 capsule 0   furosemide  (LASIX ) 20 MG tablet Take 1 tablet (20 mg total) by mouth daily. As needed for fluid retention 30 tablet 0   Multiple Vitamin (MULTIVITAMIN) tablet Take 1 tablet by mouth daily.     mupirocin  ointment (BACTROBAN ) 2 % Apply 1 Application topically 2 (two) times daily. Bid to left arm 22 g 0   olmesartan  (BENICAR ) 40 MG tablet Take 1 tablet  (40 mg total) by mouth daily. 90 tablet 3   No facility-administered medications prior to visit.    Review of Systems;  Patient denies headache, fevers, malaise, unintentional weight loss, skin rash, eye pain, sinus congestion and sinus pain, sore throat, dysphagia,  hemoptysis , cough, dyspnea, wheezing, chest pain, palpitations, orthopnea, edema, abdominal pain, nausea, melena, diarrhea, constipation, flank pain, dysuria, hematuria, urinary  Frequency, nocturia, numbness, tingling, seizures,  Focal weakness, Loss of consciousness,  Tremor, insomnia, depression, anxiety, and suicidal ideation.      Objective:  BP 118/74 (BP Location: Left Arm, Patient Position: Sitting)   Pulse 72   Temp 98.5 F (36.9 C) (Oral)   Ht 5' 5 (1.651 m)   Wt 159 lb 6.4 oz (72.3 kg)   SpO2 96%   BMI 26.53 kg/m   BP Readings from Last 3 Encounters:  03/25/24 118/74  01/01/24 131/77  12/03/23 131/77    Wt Readings from Last 3 Encounters:  03/25/24 159 lb 6.4 oz (72.3 kg)  11/20/23 160 lb 9.6 oz (72.8 kg)  09/24/23 158 lb 9.6 oz (71.9 kg)    Physical Exam  Lab Results  Component Value Date   HGBA1C 5.8 09/24/2023   HGBA1C 6.0 02/09/2022    Lab Results  Component Value Date   CREATININE 1.06 09/24/2023   CREATININE 0.9 02/14/2023  CREATININE 0.95 11/14/2022    Lab Results  Component Value Date   WBC 5.8 09/24/2023   HGB 13.2 09/24/2023   HCT 38.9 09/24/2023   PLT 225.0 09/24/2023   GLUCOSE 98 09/24/2023   CHOL 213 (H) 09/24/2023   TRIG 114.0 09/24/2023   HDL 75.70 09/24/2023   LDLDIRECT 100.0 09/24/2023   LDLCALC 114 (H) 09/24/2023   ALT 13 09/24/2023   AST 14 09/24/2023   NA 139 09/24/2023   K 4.3 09/24/2023   CL 103 09/24/2023   CREATININE 1.06 09/24/2023   BUN 18 09/24/2023   CO2 29 09/24/2023   TSH 1.65 09/24/2023   HGBA1C 5.8 09/24/2023   MICROALBUR <0.7 09/24/2023    US  Venous Img Lower Unilateral Right Result Date: 11/14/2022 CLINICAL DATA:  Right lower  extremity pain and edema for the past 6 days. Evaluate for DVT. EXAM: RIGHT LOWER EXTREMITY VENOUS DOPPLER ULTRASOUND TECHNIQUE: Gray-scale sonography with graded compression, as well as color Doppler and duplex ultrasound were performed to evaluate the lower extremity deep venous systems from the level of the common femoral vein and including the common femoral, femoral, profunda femoral, popliteal and calf veins including the posterior tibial, peroneal and gastrocnemius veins when visible. The superficial great saphenous vein was also interrogated. Spectral Doppler was utilized to evaluate flow at rest and with distal augmentation maneuvers in the common femoral, femoral and popliteal veins. COMPARISON:  Right lower extremity venous Doppler ultrasound-11/07/2022 (negative for DVT though did demonstrate a indeterminate fluid collection at patient's palpable area of concern.). FINDINGS: Contralateral Common Femoral Vein: Respiratory phasicity is normal and symmetric with the symptomatic side. No evidence of thrombus. Normal compressibility. Common Femoral Vein: No evidence of thrombus. Normal compressibility, respiratory phasicity and response to augmentation. Saphenofemoral Junction: No evidence of thrombus. Normal compressibility and flow on color Doppler imaging. Profunda Femoral Vein: No evidence of thrombus. Normal compressibility and flow on color Doppler imaging. Femoral Vein: No evidence of thrombus. Normal compressibility, respiratory phasicity and response to augmentation. Popliteal Vein: No evidence of thrombus. Normal compressibility, respiratory phasicity and response to augmentation. Calf Veins: No evidence of thrombus. Normal compressibility and flow on color Doppler imaging. Superficial Great Saphenous Vein: No evidence of thrombus. Normal compressibility. Other Findings: Note is made of a 5.1 x 3.4 x 0.7 cm fluid collection with the right popliteal fossa compatible with a Baker's cyst. There is a  moderate amount of subcutaneous edema at the patient's palpable area of concern involving the anterior aspect of the right shin without definable/drainable fluid collection. IMPRESSION: 1. No evidence of DVT the right lower extremity. 2. Incidentally noted 5.1 cm right-sided Baker's cyst. 3. Sonographic evaluation of patient's area of discomfort involving the anterior aspect of the right shin correlates with a moderate amount of subcutaneous edema without definable/drainable fluid collection Electronically Signed   By: Norleen Roulette M.D.   On: 11/14/2022 17:48    Assessment & Plan:  .Encounter for screening mammogram for malignant neoplasm of breast  Primary hypertension  Pure hypercholesterolemia  Needs flu shot -     Flu vaccine HIGH DOSE PF(Fluzone Trivalent)     I spent 34 minutes on the day of this face to face encounter reviewing patient's  most recent visit with cardiology,  nephrology,  and neurology,  prior relevant surgical and non surgical procedures, recent  labs and imaging studies, counseling on weight management,  reviewing the assessment and plan with patient, and post visit ordering and reviewing of  diagnostics and therapeutics with patient  .  Follow-up: No follow-ups on file.   Verneita LITTIE Kettering, MD "

## 2024-03-26 ENCOUNTER — Ambulatory Visit: Admitting: Internal Medicine

## 2024-03-26 ENCOUNTER — Other Ambulatory Visit (INDEPENDENT_AMBULATORY_CARE_PROVIDER_SITE_OTHER)

## 2024-03-26 ENCOUNTER — Other Ambulatory Visit

## 2024-03-26 DIAGNOSIS — D649 Anemia, unspecified: Secondary | ICD-10-CM

## 2024-03-26 LAB — CBC WITH DIFFERENTIAL/PLATELET
Basophils Absolute: 0.1 K/uL (ref 0.0–0.1)
Basophils Relative: 1.3 % (ref 0.0–3.0)
Eosinophils Absolute: 0.1 K/uL (ref 0.0–0.7)
Eosinophils Relative: 2.1 % (ref 0.0–5.0)
HCT: 33.2 % — ABNORMAL LOW (ref 36.0–46.0)
Hemoglobin: 11.1 g/dL — ABNORMAL LOW (ref 12.0–15.0)
Lymphocytes Relative: 23.9 % (ref 12.0–46.0)
Lymphs Abs: 1.4 K/uL (ref 0.7–4.0)
MCHC: 33.5 g/dL (ref 30.0–36.0)
MCV: 87.5 fl (ref 78.0–100.0)
Monocytes Absolute: 0.4 K/uL (ref 0.1–1.0)
Monocytes Relative: 7.5 % (ref 3.0–12.0)
Neutro Abs: 3.7 K/uL (ref 1.4–7.7)
Neutrophils Relative %: 65.2 % (ref 43.0–77.0)
Platelets: 229 K/uL (ref 150.0–400.0)
RBC: 3.8 Mil/uL — ABNORMAL LOW (ref 3.87–5.11)
RDW: 14.8 % (ref 11.5–15.5)
WBC: 5.7 K/uL (ref 4.0–10.5)

## 2024-03-26 LAB — LIPID PANEL
Cholesterol: 202 mg/dL — ABNORMAL HIGH (ref 28–200)
HDL: 71.8 mg/dL
LDL Cholesterol: 87 mg/dL (ref 10–99)
NonHDL: 130.59
Total CHOL/HDL Ratio: 3
Triglycerides: 217 mg/dL — ABNORMAL HIGH (ref 10.0–149.0)
VLDL: 43.4 mg/dL — ABNORMAL HIGH (ref 0.0–40.0)

## 2024-03-26 LAB — COMPREHENSIVE METABOLIC PANEL WITH GFR
ALT: 10 U/L (ref 3–35)
AST: 11 U/L (ref 5–37)
Albumin: 4.1 g/dL (ref 3.5–5.2)
Alkaline Phosphatase: 52 U/L (ref 39–117)
BUN: 17 mg/dL (ref 6–23)
CO2: 29 meq/L (ref 19–32)
Calcium: 9.3 mg/dL (ref 8.4–10.5)
Chloride: 105 meq/L (ref 96–112)
Creatinine, Ser: 0.98 mg/dL (ref 0.40–1.20)
GFR: 55.9 mL/min — ABNORMAL LOW
Glucose, Bld: 105 mg/dL — ABNORMAL HIGH (ref 70–99)
Potassium: 4.1 meq/L (ref 3.5–5.1)
Sodium: 141 meq/L (ref 135–145)
Total Bilirubin: 0.3 mg/dL (ref 0.2–1.2)
Total Protein: 6.1 g/dL (ref 6.0–8.3)

## 2024-03-26 LAB — B12 AND FOLATE PANEL
Folate: >23.4 ng/mL
Vitamin B-12: 154 pg/mL — ABNORMAL LOW (ref 211–911)

## 2024-03-26 LAB — LDL CHOLESTEROL, DIRECT: Direct LDL: 104 mg/dL

## 2024-03-26 LAB — FECAL OCCULT BLOOD, IMMUNOCHEMICAL: Fecal Occult Bld: NEGATIVE

## 2024-03-26 NOTE — Assessment & Plan Note (Signed)
 Complicated BY GRIEF AND ANXIETY.  Symptoms are better controlled since resuming Abilify  2 mg to wellbutrin  .  She has no interest in repeat attempts and talk therapy.  She is willing to see psychiatry if symptoms worsen.   Grace [name] No changes today

## 2024-03-27 ENCOUNTER — Ambulatory Visit: Payer: Self-pay | Admitting: Internal Medicine

## 2024-03-27 DIAGNOSIS — E538 Deficiency of other specified B group vitamins: Secondary | ICD-10-CM

## 2024-03-27 DIAGNOSIS — E611 Iron deficiency: Secondary | ICD-10-CM

## 2024-03-27 LAB — IRON,TIBC AND FERRITIN PANEL
%SAT: 12 % — ABNORMAL LOW (ref 16–45)
Ferritin: 7 ng/mL — ABNORMAL LOW (ref 16–288)
Iron: 46 ug/dL (ref 45–160)
TIBC: 385 ug/dL (ref 250–450)

## 2024-03-27 LAB — RETICULOCYTES
ABS Retic: 49140 {cells}/uL (ref 20000–80000)
Retic Ct Pct: 1.3 %

## 2024-03-27 LAB — ERYTHROPOIETIN: Erythropoietin: 23.5 m[IU]/mL — ABNORMAL HIGH (ref 2.6–18.5)

## 2024-03-30 DIAGNOSIS — E611 Iron deficiency: Secondary | ICD-10-CM | POA: Insufficient documentation

## 2024-03-30 DIAGNOSIS — E538 Deficiency of other specified B group vitamins: Secondary | ICD-10-CM | POA: Insufficient documentation

## 2024-03-31 ENCOUNTER — Telehealth: Payer: Self-pay

## 2024-03-31 DIAGNOSIS — E538 Deficiency of other specified B group vitamins: Secondary | ICD-10-CM

## 2024-03-31 NOTE — Telephone Encounter (Signed)
 LMTCB

## 2024-03-31 NOTE — Telephone Encounter (Signed)
 Copied from CRM [id number]. Topic: Appointments - Scheduling Inquiry for Clinic >> Mar 31, 2024  8:13 AM Robinson H wrote: Reason for CRM: Patient needs to schedule her initial B12 injection, per Dr. Marylynn notes patient needs 3 injections weekly.  [place] [phone]

## 2024-04-01 NOTE — Telephone Encounter (Unsigned)
 Copied from CRM [id number]. Topic: Appointments - Scheduling Inquiry for Clinic >> Mar 31, 2024  8:13 AM Robinson H wrote: Reason for CRM: Patient needs to schedule her initial B12 injection, per Dr. Marylynn notes patient needs 3 injections weekly.  [place] [phone] >> Mar 31, 2024  5:52 PM Drema MATSU wrote: Patient is returning a call from Shannan Slinker. Please call pt.

## 2024-04-01 NOTE — Addendum Note (Signed)
 Addended by: Clara Smolen on: 04/01/2024 03:58 PM   Modules accepted: Orders

## 2024-04-01 NOTE — Telephone Encounter (Signed)
 Spoke with pt and scheduled her for he b12 injection and lab tomorrow.

## 2024-04-02 ENCOUNTER — Ambulatory Visit

## 2024-04-02 DIAGNOSIS — E538 Deficiency of other specified B group vitamins: Secondary | ICD-10-CM

## 2024-04-02 MED ORDER — CYANOCOBALAMIN 1000 MCG/ML IJ SOLN
1000.0000 ug | Freq: Once | INTRAMUSCULAR | Status: AC
  Administered 2024-04-02: 1000 ug via INTRAMUSCULAR

## 2024-04-02 NOTE — Progress Notes (Cosign Needed Addendum)
 Patient was administered a B12 injection into her left deltoid. Patient tolerated the B12 injection well. Order for B12 injections are in Dr. Lula lab result note from 03/30/24.

## 2024-04-09 ENCOUNTER — Ambulatory Visit

## 2024-04-09 ENCOUNTER — Ambulatory Visit: Admitting: *Deleted

## 2024-04-09 VITALS — BP 130/78 | Ht 65.0 in | Wt 156.0 lb

## 2024-04-09 DIAGNOSIS — Z0000 Encounter for general adult medical examination without abnormal findings: Secondary | ICD-10-CM | POA: Diagnosis not present

## 2024-04-09 NOTE — Progress Notes (Signed)
 "  Chief Complaint  Patient presents with   Medicare Wellness     Subjective:   Grace [name] is a 79 y.o. female who presents for a Medicare Annual Wellness Visit.  Visit info / Clinical Intake: Medicare Wellness Visit Type:: Subsequent Annual Wellness Visit Persons participating in visit and providing information:: patient Medicare Wellness Visit Mode:: Telephone If telephone:: video declined Since this visit was completed virtually, some vitals may be partially provided or unavailable. Missing vitals are due to the limitations of the virtual format.: Documented vitals are patient reported If Telephone or Video please confirm:: I connected with patient using audio/video enable telemedicine. I verified patient identity with two identifiers, discussed telehealth limitations, and patient agreed to proceed. 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) Does pain affect daily life?: no Are you currently prescribed opioids?: no  Dietary Habits and Nutritional Risks How many meals a day?: 3 Eats fruit and vegetables daily?: yes Most meals are obtained by: preparing own meals In the last 2 weeks, have you had any of the following?: (!) nausea, vomiting, diarrhea (nausea because of hiatal hernia which normal for her) Diabetic:: no  Functional Status Activities of Daily Living (to include ambulation/medication): Independent Ambulation: Independent Medication Administration: Independent Home Management (perform basic housework or laundry): Independent Manage your own finances?: yes Primary transportation is: driving Concerns about vision?: no *vision screening is required for  WTM* Concerns about hearing?: no  Fall Screening Falls in the past year?: 1 Number of falls in past year: 0 Was there an injury with Fall?: 0 Fall Risk Category Calculator: 1 Patient Fall Risk Level: Low Fall Risk  Fall Risk Patient at Risk for Falls Due to: History of fall(s); Impaired balance/gait (tripped over edge of rug) Fall risk Follow up: Falls evaluation completed; Falls prevention discussed  Home and Transportation Safety: All rugs have non-skid backing?: yes All stairs or steps have railings?: yes Grab bars in the bathtub or shower?: (!) no Have non-skid surface in bathtub or shower?: (!) no Good home lighting?: yes Regular seat belt use?: yes Hospital stays in the last year:: no  Cognitive Assessment Difficulty concentrating, remembering, or making decisions? : no Will 6CIT or Mini Cog be Completed: yes What year is it?: 0 points What month is it?: 0 points Give patient an address phrase to remember (5 components): [address] [place] About what time is it?: 0 points Count backwards from 20 to 1: 0 points Say the months of the year in reverse: 0 points Repeat the address phrase from earlier: 0 points 6 CIT Score: 0 points  Advance Directives (For Healthcare) Does Patient Have a Medical Advance Directive?: Yes Does patient want to make changes to medical advance directive?: No - Patient declined Type of Advance Directive: Healthcare Power of [place]; Living will Copy of Healthcare Power of Attorney in Chart?: No - copy requested Copy of Living Will in Chart?: No - copy requested  Reviewed/Updated  Reviewed/Updated: Reviewed All (Medical, Surgical, Family, Medications, Allergies, Care Teams, Patient Goals)    Allergies (verified) Patient has no known allergies.   Current Medications (verified) Outpatient Encounter Medications as of 04/09/2024  Medication Sig   ARIPiprazole  (ABILIFY ) 2 MG tablet Take 1 tablet (2 mg total) by mouth  daily.    esomeprazole  (NEXIUM ) 20 MG capsule Take 20 mg by mouth daily at 12 noon.   ezetimibe  (ZETIA ) 10 MG tablet Take 1 tablet by mouth once daily   famotidine  (PEPCID ) 20 MG tablet Take 1 tablet (20 mg total) by mouth 2 (two) times daily.   FLUoxetine  (PROZAC ) 20 MG capsule TAKE 1 CAPSULE BY MOUTH ONCE DAILY ALONG WITH 40MG  FOR TOTAL DOSE OF 60MG    FLUoxetine  (PROZAC ) 40 MG capsule TAKE 1 CAPSULE BY MOUTH ONCE DAILY (DISCONTINUE  PAXIL )   furosemide  (LASIX ) 20 MG tablet Take 1 tablet (20 mg total) by mouth daily. As needed for fluid retention   Multiple Vitamin (MULTIVITAMIN) tablet Take 1 tablet by mouth daily.   mupirocin  ointment (BACTROBAN ) 2 % Apply 1 Application topically 2 (two) times daily. Bid to left arm (Patient taking differently: Apply 1 Application topically 2 (two) times daily. Bid to left arm as needed)   olmesartan  (BENICAR ) 40 MG tablet Take 1 tablet (40 mg total) by mouth daily.   No facility-administered encounter medications on file as of 04/09/2024.    History: Past Medical History:  Diagnosis Date   Actinic keratosis    allergic rhinitis 1985   Anxiety 1993   Basal cell carcinoma    Areas of face   BCC (basal cell carcinoma of skin) 07/05/2021   L forearm, EDC   BCC (basal cell carcinoma) 10/30/2023   Superfical, L upper temproal hairline, MOHs 12/03/23   Benign heart murmur    Cataract 2020   Chronic kidney disease 2020   Depression 1993   resolved   GERD (gastroesophageal reflux disease)    Hx of colonic polyps    Hyperlipidemia 2000   Hypertension 2000   Neuromuscular disorder (HCC) 2019   Past Surgical History:  Procedure Laterality Date   ABDOMINAL HYSTERECTOMY  1997   BREAST SURGERY  1997   Benign cyst   EYE SURGERY  2022   Cataracts   FRACTURE SURGERY  1995   Broken arm-pin inserted   skin cancer removed  11/2023   face at hairline   TOTAL ABDOMINAL HYSTERECTOMY W/ BILATERAL SALPINGOOPHORECTOMY  03/13/1989   excessive bleeding   Family History   Problem Relation Age of Onset   Mental illness Mother 9       alzheimers dementia   Intellectual disability Mother    Cancer Father 21       prostate   Heart disease Father        pacemaker,  no AMI   Anxiety disorder Father    Depression Father    Hearing loss Father    Hyperlipidemia Father    Hypertension Father    Arthritis Maternal Grandmother    Vision loss Maternal Grandmother    Heart disease Paternal Grandmother    Social History   Occupational History   Not on file  Tobacco Use   Smoking status: Never   Smokeless tobacco: Never  Substance and Sexual Activity   Alcohol use: No   Drug use: No   Sexual activity: Not on file   Tobacco Counseling Counseling given: Not Answered  SDOH Screenings   Food Insecurity: No Food Insecurity (04/09/2024)  Housing: Low Risk (04/09/2024)  Transportation Needs: No Transportation Needs (04/09/2024)  Utilities: Not At Risk (04/09/2024)  Alcohol Screen: Low Risk (04/09/2024)  Depression (PHQ2-9): Low Risk (04/09/2024)  Recent Concern: Depression (PHQ2-9) - High Risk (03/25/2024)  Financial Resource Strain: Low Risk (04/09/2024)  Physical Activity: Insufficiently Active (04/09/2024)  Social  Connections: Moderately Integrated (04/09/2024)  Stress: No Stress Concern Present (04/09/2024)  Recent Concern: Stress - Stress Concern Present (03/22/2024)  Tobacco Use: Low Risk (04/09/2024)  Health Literacy: Adequate Health Literacy (04/09/2024)   See flowsheets for full screening details  Depression Screen PHQ 2 & 9 Depression Scale- Over the past 2 weeks, how often have you been bothered by any of the following problems? Little interest or pleasure in doing things: 0 Feeling down, depressed, or hopeless (PHQ Adolescent also includes...irritable): 1 PHQ-2 Total Score: 1 Trouble falling or staying asleep, or sleeping too much: 0 Feeling tired or having little energy: 0 Poor appetite or overeating (PHQ Adolescent also includes...weight loss):  0 Feeling bad about yourself - or that you are a failure or have let yourself or your family down: 0 Trouble concentrating on things, such as reading the newspaper or watching television (PHQ Adolescent also includes...like school work): 0 Moving or speaking so slowly that other people could have noticed. Or the opposite - being so fidgety or restless that you have been moving around a lot more than usual: 0 Thoughts that you would be better off dead, or of hurting yourself in some way: 0 PHQ-9 Total Score: 1 If you checked off any problems, how difficult have these problems made it for you to do your work, take care of things at home, or get along with other people?: Not difficult at all  Depression Treatment Depression Interventions/Treatment : Counseling; Currently on Treatment     Goals Addressed             This Visit's Progress    Patient Stated       wants to keep up with her exercise             Objective:    Today's Vitals   04/09/24 0811  BP: 130/78  Weight: 156 lb (70.8 kg)  Height: 5' 5 (1.651 m)   Body mass index is 25.96 kg/m.  Hearing/Vision screen Hearing Screening - Comments:: No issues Vision Screening - Comments:: Glasses, [facility] ,  up to date Immunizations and Health Maintenance Health Maintenance  Topic Date Due   Mammogram  04/08/2024   COVID-19 Vaccine (4 - 2025-26 season) 04/10/2024 (Originally 11/12/2023)   Medicare Annual Wellness (AWV)  04/09/2025   Colonoscopy  07/16/2025   DTaP/Tdap/Td (3 - Td or Tdap) 03/14/2032   Pneumococcal Vaccine: 50+ Years  Completed   Influenza Vaccine  Completed   Bone Density Scan  Completed   Hepatitis C Screening  Completed   Zoster Vaccines- Shingrix  Completed   Meningococcal B Vaccine  Aged Out        Assessment/Plan:  This is a routine wellness examination for Grace [name].  Patient Care Team: Marylynn Verneita CROME, MD as PCP - General (Internal Medicine) Leobardo Anes, DO as Consulting  Physician (Nephrology) Jackquline Sawyer, MD (Dermatology) Delores Orvin BRAVO, NP as Nurse Practitioner (Vascular Surgery) Verta Royden DASEN, [place] as Consulting Physician (Podiatry) Corey, Rufus HERO, MD (Dermatology)  I have personally reviewed and noted the following in the patients chart:   Medical and social history Use of alcohol, tobacco or illicit drugs  Current medications and supplements including opioid prescriptions. Functional ability and status Nutritional status Physical activity Advanced directives List of other physicians Hospitalizations, surgeries, and ER visits in previous 12 months Vitals Screenings to include cognitive, depression, and falls Referrals and appointments  No orders of the defined types were placed in this encounter.  In addition, I have reviewed  and discussed with patient certain preventive protocols, quality metrics, and best practice recommendations. A written personalized care plan for preventive services as well as general preventive health recommendations were provided to patient.   Angeline Fredericks, LPN   8/71/7973   Return in 1 year (on 04/09/2025).  After Visit Summary: (MyChart) Due to this being a telephonic visit, the after visit summary with patients personalized plan was offered to patient via MyChart   Nurse Notes: Patient is scheduled to have her mammogram tomorrow 04/10/24. Patient declines covid vaccine. "

## 2024-04-09 NOTE — Patient Instructions (Signed)
 [name],  Thank you for taking the time for your Medicare Wellness Visit. I appreciate your continued commitment to your health goals. Please review the care plan we discussed, and feel free to reach out if I can assist you further.  Please note that Annual Wellness Visits do not include a physical exam. Some assessments may be limited, especially if the visit was conducted virtually. If needed, we may recommend an in-person follow-up with your provider.  Ongoing Care Seeing your primary care provider every 3 to 6 months helps us  monitor your health and provide consistent, personalized care.  Remember to get your mammogram tomorrow 04/10/24. Make sure you bring your Advanced Directives to your next office visit for us  to copy and put in your chart.  Referrals If a referral was made during today's visit and you haven't received any updates within two weeks, please contact the referred provider directly to check on the status.  Recommended Screenings:  Health Maintenance  Topic Date Due   Breast Cancer Screening  04/08/2024   COVID-19 Vaccine (4 - 2025-26 season) 04/10/2024*   Medicare Annual Wellness Visit  04/09/2025   Colon Cancer Screening  07/16/2025   DTaP/Tdap/Td vaccine (3 - Td or Tdap) 03/14/2032   Pneumococcal Vaccine for age over 65  Completed   Flu Shot  Completed   Osteoporosis screening with Bone Density Scan  Completed   Hepatitis C Screening  Completed   Zoster (Shingles) Vaccine  Completed   Meningitis B Vaccine  Aged Out  *Topic was postponed. The date shown is not the original due date.       04/09/2024    8:16 AM  Advanced Directives  Does Patient Have a Medical Advance Directive? Yes  Type of Estate Agent of [place];Living will  Does patient want to make changes to medical advance directive? No - Patient declined  Copy of Healthcare Power of Attorney in Chart? No - copy requested    Vision: Annual vision screenings are recommended  for early detection of glaucoma, cataracts, and diabetic retinopathy. These exams can also reveal signs of chronic conditions such as diabetes and high blood pressure.  Dental: Annual dental screenings help detect early signs of oral cancer, gum disease, and other conditions linked to overall health, including heart disease and diabetes.  Please see the attached documents for additional preventive care recommendations.

## 2024-04-10 ENCOUNTER — Encounter: Payer: Self-pay | Admitting: Internal Medicine

## 2024-04-10 ENCOUNTER — Ambulatory Visit

## 2024-04-10 DIAGNOSIS — E538 Deficiency of other specified B group vitamins: Secondary | ICD-10-CM | POA: Diagnosis not present

## 2024-04-10 MED ORDER — CYANOCOBALAMIN 1000 MCG/ML IJ SOLN
1000.0000 ug | Freq: Once | INTRAMUSCULAR | Status: AC
  Administered 2024-04-10: 1000 ug via INTRAMUSCULAR

## 2024-04-10 NOTE — Progress Notes (Signed)
 Pt received B12 injection in Left  deltoid muscle. Pt tolerated it well with no complaints or concerns.

## 2024-04-11 ENCOUNTER — Encounter: Payer: Self-pay | Admitting: Internal Medicine

## 2024-04-11 LAB — HM MAMMOGRAPHY

## 2024-04-16 ENCOUNTER — Ambulatory Visit

## 2024-04-16 DIAGNOSIS — E538 Deficiency of other specified B group vitamins: Secondary | ICD-10-CM

## 2024-04-16 MED ORDER — CYANOCOBALAMIN 1000 MCG/ML IJ SOLN
1000.0000 ug | Freq: Once | INTRAMUSCULAR | Status: AC
  Administered 2024-04-16: 1000 ug via INTRAMUSCULAR

## 2024-04-16 NOTE — Progress Notes (Signed)
 Patient was administered a B12 injection into her right deltoid. Patient tolerated the B12 injection well.

## 2024-06-17 ENCOUNTER — Ambulatory Visit: Admitting: Dermatology

## 2024-06-30 ENCOUNTER — Ambulatory Visit: Admitting: Internal Medicine

## 2024-11-04 ENCOUNTER — Encounter: Admitting: Dermatology

## 2025-04-13 ENCOUNTER — Ambulatory Visit
